# Patient Record
Sex: Male | Born: 1945 | Race: White | Hispanic: No | Marital: Married | Smoking: Former smoker
Health system: Southern US, Community
[De-identification: ages and names within clinical notes are randomized; demographics above are authoritative.]

## PROBLEM LIST (undated history)

## (undated) DIAGNOSIS — K219 Gastro-esophageal reflux disease without esophagitis: Secondary | ICD-10-CM

## (undated) DIAGNOSIS — Z87891 Personal history of nicotine dependence: Secondary | ICD-10-CM

## (undated) DIAGNOSIS — I519 Heart disease, unspecified: Secondary | ICD-10-CM

## (undated) DIAGNOSIS — E785 Hyperlipidemia, unspecified: Secondary | ICD-10-CM

## (undated) DIAGNOSIS — I10 Essential (primary) hypertension: Secondary | ICD-10-CM

## (undated) DIAGNOSIS — Z9289 Personal history of other medical treatment: Secondary | ICD-10-CM

## (undated) DIAGNOSIS — I2510 Atherosclerotic heart disease of native coronary artery without angina pectoris: Secondary | ICD-10-CM

## (undated) DIAGNOSIS — K5730 Diverticulosis of large intestine without perforation or abscess without bleeding: Secondary | ICD-10-CM

## (undated) DIAGNOSIS — K2970 Gastritis, unspecified, without bleeding: Secondary | ICD-10-CM

## (undated) DIAGNOSIS — M84376A Stress fracture, unspecified foot, initial encounter for fracture: Secondary | ICD-10-CM

## (undated) DIAGNOSIS — K222 Esophageal obstruction: Secondary | ICD-10-CM

## (undated) HISTORY — DX: Personal history of other medical treatment: Z92.89

## (undated) HISTORY — DX: Diverticulosis of large intestine without perforation or abscess without bleeding: K57.30

## (undated) HISTORY — DX: Atherosclerotic heart disease of native coronary artery without angina pectoris: I25.10

## (undated) HISTORY — PX: CATARACT EXTRACTION, BILATERAL: SHX1313

## (undated) HISTORY — DX: Gastro-esophageal reflux disease without esophagitis: K21.9

## (undated) HISTORY — DX: Gastritis, unspecified, without bleeding: K29.70

## (undated) HISTORY — DX: Personal history of nicotine dependence: Z87.891

## (undated) HISTORY — DX: Hyperlipidemia, unspecified: E78.5

## (undated) HISTORY — DX: Esophageal obstruction: K22.2

## (undated) HISTORY — DX: Heart disease, unspecified: I51.9

## (undated) HISTORY — DX: Essential (primary) hypertension: I10

## (undated) HISTORY — DX: Stress fracture, unspecified foot, initial encounter for fracture: M84.376A

---

## 1996-09-16 DIAGNOSIS — K219 Gastro-esophageal reflux disease without esophagitis: Secondary | ICD-10-CM

## 1996-09-16 HISTORY — DX: Gastro-esophageal reflux disease without esophagitis: K21.9

## 1999-03-17 DIAGNOSIS — E785 Hyperlipidemia, unspecified: Secondary | ICD-10-CM | POA: Insufficient documentation

## 1999-03-17 DIAGNOSIS — I10 Essential (primary) hypertension: Secondary | ICD-10-CM | POA: Insufficient documentation

## 1999-03-17 HISTORY — DX: Essential (primary) hypertension: I10

## 1999-03-17 HISTORY — DX: Hyperlipidemia, unspecified: E78.5

## 2000-04-16 ENCOUNTER — Encounter: Payer: Self-pay | Admitting: Family Medicine

## 2000-04-16 LAB — CONVERTED CEMR LAB: PSA: 0.6 ng/mL

## 2000-09-15 ENCOUNTER — Emergency Department (HOSPITAL_COMMUNITY): Admission: EM | Admit: 2000-09-15 | Discharge: 2000-09-15 | Payer: Self-pay | Admitting: Emergency Medicine

## 2000-09-16 ENCOUNTER — Encounter: Payer: Self-pay | Admitting: Emergency Medicine

## 2000-10-08 ENCOUNTER — Ambulatory Visit (HOSPITAL_COMMUNITY): Admission: RE | Admit: 2000-10-08 | Discharge: 2000-10-08 | Payer: Self-pay | Admitting: Internal Medicine

## 2000-10-08 ENCOUNTER — Encounter: Payer: Self-pay | Admitting: Internal Medicine

## 2000-10-08 HISTORY — PX: ESOPHAGOGASTRODUODENOSCOPY: SHX1529

## 2001-05-17 ENCOUNTER — Encounter: Payer: Self-pay | Admitting: Family Medicine

## 2001-05-17 LAB — CONVERTED CEMR LAB: PSA: 0.6 ng/mL

## 2002-06-16 ENCOUNTER — Encounter: Payer: Self-pay | Admitting: Family Medicine

## 2002-06-16 LAB — CONVERTED CEMR LAB: PSA: 0.4 ng/mL

## 2003-08-17 ENCOUNTER — Encounter: Payer: Self-pay | Admitting: Family Medicine

## 2003-08-17 LAB — CONVERTED CEMR LAB: PSA: 0.5 ng/mL

## 2004-07-17 DIAGNOSIS — I2510 Atherosclerotic heart disease of native coronary artery without angina pectoris: Secondary | ICD-10-CM | POA: Insufficient documentation

## 2004-07-17 HISTORY — DX: Atherosclerotic heart disease of native coronary artery without angina pectoris: I25.10

## 2004-07-18 ENCOUNTER — Ambulatory Visit: Payer: Self-pay | Admitting: Thoracic Surgery (Cardiothoracic Vascular Surgery)

## 2004-07-18 ENCOUNTER — Inpatient Hospital Stay (HOSPITAL_COMMUNITY): Admission: EM | Admit: 2004-07-18 | Discharge: 2004-07-24 | Payer: Self-pay

## 2004-07-18 ENCOUNTER — Ambulatory Visit: Payer: Self-pay | Admitting: Family Medicine

## 2004-07-21 DIAGNOSIS — Z951 Presence of aortocoronary bypass graft: Secondary | ICD-10-CM

## 2004-07-27 HISTORY — PX: CORONARY ARTERY BYPASS GRAFT: SHX141

## 2004-07-30 ENCOUNTER — Ambulatory Visit: Payer: Self-pay | Admitting: Cardiology

## 2004-08-03 ENCOUNTER — Ambulatory Visit: Payer: Self-pay | Admitting: Cardiology

## 2004-08-03 ENCOUNTER — Ambulatory Visit: Payer: Self-pay

## 2004-08-08 ENCOUNTER — Ambulatory Visit: Payer: Self-pay

## 2004-08-16 ENCOUNTER — Ambulatory Visit: Payer: Self-pay | Admitting: Cardiology

## 2004-08-29 ENCOUNTER — Ambulatory Visit: Payer: Self-pay | Admitting: Cardiology

## 2004-10-26 ENCOUNTER — Ambulatory Visit: Payer: Self-pay | Admitting: Cardiology

## 2004-11-16 ENCOUNTER — Ambulatory Visit: Payer: Self-pay | Admitting: Cardiology

## 2004-11-27 ENCOUNTER — Ambulatory Visit: Payer: Self-pay | Admitting: Cardiology

## 2005-01-03 ENCOUNTER — Ambulatory Visit: Payer: Self-pay | Admitting: Family Medicine

## 2005-02-05 ENCOUNTER — Ambulatory Visit: Payer: Self-pay | Admitting: Internal Medicine

## 2005-03-07 ENCOUNTER — Ambulatory Visit: Payer: Self-pay | Admitting: Family Medicine

## 2005-04-09 ENCOUNTER — Ambulatory Visit: Payer: Self-pay | Admitting: Family Medicine

## 2005-05-17 ENCOUNTER — Encounter: Payer: Self-pay | Admitting: Family Medicine

## 2005-05-17 LAB — CONVERTED CEMR LAB: PSA: 0.35 ng/mL

## 2005-06-12 ENCOUNTER — Ambulatory Visit: Payer: Self-pay | Admitting: Family Medicine

## 2005-06-20 ENCOUNTER — Ambulatory Visit: Payer: Self-pay | Admitting: Family Medicine

## 2005-07-10 ENCOUNTER — Ambulatory Visit: Payer: Self-pay | Admitting: Family Medicine

## 2005-09-13 ENCOUNTER — Ambulatory Visit: Payer: Self-pay | Admitting: Family Medicine

## 2005-09-20 ENCOUNTER — Ambulatory Visit: Payer: Self-pay | Admitting: Family Medicine

## 2005-09-26 ENCOUNTER — Ambulatory Visit: Payer: Self-pay | Admitting: Family Medicine

## 2005-11-25 ENCOUNTER — Ambulatory Visit: Payer: Self-pay | Admitting: Internal Medicine

## 2005-12-09 ENCOUNTER — Ambulatory Visit: Payer: Self-pay | Admitting: Cardiology

## 2005-12-10 ENCOUNTER — Ambulatory Visit: Payer: Self-pay | Admitting: Cardiology

## 2005-12-15 DIAGNOSIS — K5730 Diverticulosis of large intestine without perforation or abscess without bleeding: Secondary | ICD-10-CM

## 2005-12-15 HISTORY — DX: Diverticulosis of large intestine without perforation or abscess without bleeding: K57.30

## 2005-12-30 ENCOUNTER — Ambulatory Visit: Payer: Self-pay | Admitting: Internal Medicine

## 2005-12-30 HISTORY — PX: COLONOSCOPY: SHX174

## 2006-06-16 ENCOUNTER — Encounter: Payer: Self-pay | Admitting: Family Medicine

## 2006-06-16 LAB — CONVERTED CEMR LAB: PSA: 0.3 ng/mL

## 2006-06-24 ENCOUNTER — Ambulatory Visit: Payer: Self-pay | Admitting: Family Medicine

## 2006-07-04 ENCOUNTER — Ambulatory Visit: Payer: Self-pay | Admitting: Family Medicine

## 2006-07-08 ENCOUNTER — Ambulatory Visit: Payer: Self-pay | Admitting: Family Medicine

## 2006-10-23 ENCOUNTER — Ambulatory Visit: Payer: Self-pay | Admitting: Family Medicine

## 2006-11-17 ENCOUNTER — Ambulatory Visit (HOSPITAL_COMMUNITY): Admission: RE | Admit: 2006-11-17 | Discharge: 2006-11-17 | Payer: Self-pay | Admitting: Family Medicine

## 2006-11-17 ENCOUNTER — Encounter (INDEPENDENT_AMBULATORY_CARE_PROVIDER_SITE_OTHER): Payer: Self-pay | Admitting: *Deleted

## 2006-11-17 ENCOUNTER — Ambulatory Visit: Payer: Self-pay | Admitting: Family Medicine

## 2006-11-25 ENCOUNTER — Ambulatory Visit: Payer: Self-pay | Admitting: Internal Medicine

## 2006-11-26 ENCOUNTER — Ambulatory Visit: Payer: Self-pay | Admitting: Internal Medicine

## 2006-11-26 ENCOUNTER — Encounter (INDEPENDENT_AMBULATORY_CARE_PROVIDER_SITE_OTHER): Payer: Self-pay | Admitting: Specialist

## 2006-11-26 HISTORY — PX: COLONOSCOPY: SHX174

## 2006-11-27 ENCOUNTER — Ambulatory Visit: Payer: Self-pay | Admitting: Cardiology

## 2007-04-30 ENCOUNTER — Telehealth (INDEPENDENT_AMBULATORY_CARE_PROVIDER_SITE_OTHER): Payer: Self-pay | Admitting: *Deleted

## 2007-06-03 ENCOUNTER — Encounter: Payer: Self-pay | Admitting: Family Medicine

## 2007-06-04 DIAGNOSIS — H409 Unspecified glaucoma: Secondary | ICD-10-CM | POA: Insufficient documentation

## 2007-06-04 DIAGNOSIS — N529 Male erectile dysfunction, unspecified: Secondary | ICD-10-CM | POA: Insufficient documentation

## 2007-06-04 DIAGNOSIS — R7309 Other abnormal glucose: Secondary | ICD-10-CM | POA: Insufficient documentation

## 2007-06-04 DIAGNOSIS — E291 Testicular hypofunction: Secondary | ICD-10-CM

## 2007-07-01 ENCOUNTER — Ambulatory Visit: Payer: Self-pay | Admitting: Family Medicine

## 2007-07-01 LAB — CONVERTED CEMR LAB
BUN: 20 mg/dL (ref 6–23)
Basophils Absolute: 0 10*3/uL (ref 0.0–0.1)
Basophils Relative: 0.7 % (ref 0.0–1.0)
CO2: 31 meq/L (ref 19–32)
Calcium: 10.3 mg/dL (ref 8.4–10.5)
Chloride: 101 meq/L (ref 96–112)
Cholesterol: 139 mg/dL (ref 0–200)
Creatinine, Ser: 1 mg/dL (ref 0.4–1.5)
Eosinophils Absolute: 0.2 10*3/uL (ref 0.0–0.6)
Eosinophils Relative: 2.6 % (ref 0.0–5.0)
GFR calc Af Amer: 98 mL/min
GFR calc non Af Amer: 81 mL/min
Glucose, Bld: 105 mg/dL — ABNORMAL HIGH (ref 70–99)
HCT: 40.6 % (ref 39.0–52.0)
HDL: 49.1 mg/dL (ref >39.0)
Hemoglobin: 14.5 g/dL (ref 13.0–17.0)
LDL Cholesterol: 63 mg/dL (ref 0–99)
Lymphocytes Relative: 32.3 % (ref 12.0–46.0)
MCHC: 35.7 g/dL (ref 30.0–36.0)
MCV: 94.5 fL (ref 78.0–100.0)
Monocytes Absolute: 0.6 10*3/uL (ref 0.2–0.7)
Monocytes Relative: 9.4 % (ref 3.0–11.0)
Neutro Abs: 3.7 10*3/uL (ref 1.4–7.7)
Neutrophils Relative %: 55 % (ref 43.0–77.0)
PSA: 0.34 ng/mL (ref 0.10–4.00)
Platelets: 196 10*3/uL (ref 150–400)
Potassium: 4.7 meq/L (ref 3.5–5.1)
RBC: 4.29 M/uL (ref 4.22–5.81)
RDW: 12.9 % (ref 11.5–14.6)
Sodium: 138 meq/L (ref 135–145)
TSH: 1.23 microintl units/mL (ref 0.35–5.50)
Testosterone: 310.64 ng/dL — ABNORMAL LOW (ref 350.00–890)
Total CHOL/HDL Ratio: 2.8
Triglycerides: 134 mg/dL (ref 0–149)
VLDL: 27 mg/dL (ref 0–40)
WBC: 6.6 10*3/uL (ref 4.5–10.5)

## 2007-07-07 ENCOUNTER — Ambulatory Visit: Payer: Self-pay | Admitting: Family Medicine

## 2007-07-24 ENCOUNTER — Telehealth (INDEPENDENT_AMBULATORY_CARE_PROVIDER_SITE_OTHER): Payer: Self-pay | Admitting: *Deleted

## 2007-11-23 ENCOUNTER — Telehealth: Payer: Self-pay | Admitting: Family Medicine

## 2007-12-03 ENCOUNTER — Ambulatory Visit: Payer: Self-pay | Admitting: Cardiology

## 2008-04-26 ENCOUNTER — Telehealth: Payer: Self-pay | Admitting: Family Medicine

## 2008-07-04 ENCOUNTER — Ambulatory Visit: Payer: Self-pay | Admitting: Family Medicine

## 2008-07-04 LAB — CONVERTED CEMR LAB
ALT: 24 units/L (ref 0–53)
AST: 19 units/L (ref 0–37)
Albumin: 4.1 g/dL (ref 3.5–5.2)
Alkaline Phosphatase: 57 units/L (ref 39–117)
BUN: 14 mg/dL (ref 6–23)
Basophils Absolute: 0 10*3/uL (ref 0.0–0.1)
Basophils Relative: 0.7 % (ref 0.0–3.0)
Bilirubin, Direct: 0.1 mg/dL (ref 0.0–0.3)
CO2: 32 meq/L (ref 19–32)
Calcium: 9.6 mg/dL (ref 8.4–10.5)
Chloride: 99 meq/L (ref 96–112)
Cholesterol: 143 mg/dL (ref 0–200)
Creatinine, Ser: 1 mg/dL (ref 0.4–1.5)
Creatinine,U: 58.8 mg/dL
Eosinophils Absolute: 0.5 10*3/uL (ref 0.0–0.7)
Eosinophils Relative: 8.1 % — ABNORMAL HIGH (ref 0.0–5.0)
GFR calc Af Amer: 97 mL/min
GFR calc non Af Amer: 80 mL/min
Glucose, Bld: 113 mg/dL — ABNORMAL HIGH (ref 70–99)
HCT: 42.5 % (ref 39.0–52.0)
HDL: 53 mg/dL (ref >39.0)
Hemoglobin: 14.9 g/dL (ref 13.0–17.0)
LDL Cholesterol: 56 mg/dL (ref 0–99)
Lymphocytes Relative: 25.2 % (ref 12.0–46.0)
MCHC: 35.1 g/dL (ref 30.0–36.0)
MCV: 96.6 fL (ref 78.0–100.0)
Microalb Creat Ratio: 5.1 mg/g (ref 0.0–30.0)
Microalb, Ur: 0.3 mg/dL (ref 0.0–1.9)
Monocytes Absolute: 0.5 10*3/uL (ref 0.1–1.0)
Monocytes Relative: 8.8 % (ref 3.0–12.0)
Neutro Abs: 3.6 10*3/uL (ref 1.4–7.7)
Neutrophils Relative %: 57.2 % (ref 43.0–77.0)
PSA: 0.35 ng/mL (ref 0.10–4.00)
Platelets: 173 10*3/uL (ref 150–400)
Potassium: 4 meq/L (ref 3.5–5.1)
RBC: 4.4 M/uL (ref 4.22–5.81)
RDW: 12.6 % (ref 11.5–14.6)
Sodium: 139 meq/L (ref 135–145)
TSH: 1.69 microintl units/mL (ref 0.35–5.50)
Testosterone: 301.86 ng/dL — ABNORMAL LOW (ref 350.00–890)
Total Bilirubin: 0.9 mg/dL (ref 0.3–1.2)
Total CHOL/HDL Ratio: 2.7
Total Protein: 7.6 g/dL (ref 6.0–8.3)
Triglycerides: 168 mg/dL — ABNORMAL HIGH (ref 0–149)
VLDL: 34 mg/dL (ref 0–40)
WBC: 6.2 10*3/uL (ref 4.5–10.5)

## 2008-07-07 ENCOUNTER — Ambulatory Visit: Payer: Self-pay | Admitting: Family Medicine

## 2008-07-07 DIAGNOSIS — K5289 Other specified noninfective gastroenteritis and colitis: Secondary | ICD-10-CM

## 2008-07-22 ENCOUNTER — Ambulatory Visit: Payer: Self-pay | Admitting: Family Medicine

## 2008-07-22 LAB — CONVERTED CEMR LAB
OCCULT 1: NEGATIVE
OCCULT 2: NEGATIVE
OCCULT 3: NEGATIVE

## 2008-07-25 ENCOUNTER — Encounter (INDEPENDENT_AMBULATORY_CARE_PROVIDER_SITE_OTHER): Payer: Self-pay | Admitting: *Deleted

## 2008-08-05 ENCOUNTER — Telehealth: Payer: Self-pay | Admitting: Family Medicine

## 2008-11-21 ENCOUNTER — Encounter: Payer: Self-pay | Admitting: Cardiology

## 2008-11-21 ENCOUNTER — Ambulatory Visit: Payer: Self-pay | Admitting: Cardiology

## 2008-11-21 DIAGNOSIS — E663 Overweight: Secondary | ICD-10-CM | POA: Insufficient documentation

## 2009-04-04 ENCOUNTER — Ambulatory Visit: Payer: Self-pay | Admitting: Family Medicine

## 2009-04-04 LAB — CONVERTED CEMR LAB
Bilirubin Urine: NEGATIVE
Blood in Urine, dipstick: NEGATIVE
Glucose, Urine, Semiquant: NEGATIVE
Ketones, urine, test strip: NEGATIVE
Nitrite: NEGATIVE
Protein, U semiquant: NEGATIVE
Specific Gravity, Urine: 1.01
Urobilinogen, UA: 0.2
WBC Urine, dipstick: NEGATIVE
pH: 7

## 2009-07-10 ENCOUNTER — Ambulatory Visit: Payer: Self-pay | Admitting: Family Medicine

## 2009-07-10 LAB — CONVERTED CEMR LAB
ALT: 25 units/L (ref 0–53)
AST: 16 units/L (ref 0–37)
Albumin: 4 g/dL (ref 3.5–5.2)
Alkaline Phosphatase: 56 units/L (ref 39–117)
BUN: 17 mg/dL (ref 6–23)
Basophils Absolute: 0.1 10*3/uL (ref 0.0–0.1)
Basophils Relative: 1.2 % (ref 0.0–3.0)
Bilirubin, Direct: 0.1 mg/dL (ref 0.0–0.3)
CO2: 26 meq/L (ref 19–32)
Calcium: 9.2 mg/dL (ref 8.4–10.5)
Chloride: 102 meq/L (ref 96–112)
Cholesterol: 140 mg/dL (ref 0–200)
Creatinine, Ser: 1.1 mg/dL (ref 0.4–1.5)
Creatinine,U: 75 mg/dL
Direct LDL: 64.1 mg/dL
Eosinophils Absolute: 0.4 10*3/uL (ref 0.0–0.7)
Eosinophils Relative: 7.6 % — ABNORMAL HIGH (ref 0.0–5.0)
GFR calc non Af Amer: 71.84 mL/min (ref >60)
Glucose, Bld: 105 mg/dL — ABNORMAL HIGH (ref 70–99)
HCT: 39.8 % (ref 39.0–52.0)
HDL: 47.4 mg/dL (ref >39.00)
Hemoglobin: 14.2 g/dL (ref 13.0–17.0)
Lymphocytes Relative: 25.3 % (ref 12.0–46.0)
Lymphs Abs: 1.5 10*3/uL (ref 0.7–4.0)
MCHC: 35.6 g/dL (ref 30.0–36.0)
MCV: 99 fL (ref 78.0–100.0)
Microalb Creat Ratio: 2.7 mg/g (ref 0.0–30.0)
Microalb, Ur: 0.2 mg/dL (ref 0.0–1.9)
Monocytes Absolute: 0.5 10*3/uL (ref 0.1–1.0)
Monocytes Relative: 8.7 % (ref 3.0–12.0)
Neutro Abs: 3.4 10*3/uL (ref 1.4–7.7)
Neutrophils Relative %: 57.2 % (ref 43.0–77.0)
PSA: 0.45 ng/mL (ref 0.10–4.00)
Platelets: 187 10*3/uL (ref 150.0–400.0)
Potassium: 3.9 meq/L (ref 3.5–5.1)
RBC: 4.02 M/uL — ABNORMAL LOW (ref 4.22–5.81)
RDW: 12.3 % (ref 11.5–14.6)
Sodium: 137 meq/L (ref 135–145)
TSH: 2.02 microintl units/mL (ref 0.35–5.50)
Total Bilirubin: 0.9 mg/dL (ref 0.3–1.2)
Total CHOL/HDL Ratio: 3
Total Protein: 7.3 g/dL (ref 6.0–8.3)
Triglycerides: 213 mg/dL — ABNORMAL HIGH (ref 0.0–149.0)
VLDL: 42.6 mg/dL — ABNORMAL HIGH (ref 0.0–40.0)
WBC: 5.9 10*3/uL (ref 4.5–10.5)

## 2009-07-13 ENCOUNTER — Ambulatory Visit: Payer: Self-pay | Admitting: Family Medicine

## 2009-07-13 DIAGNOSIS — R490 Dysphonia: Secondary | ICD-10-CM

## 2009-07-21 ENCOUNTER — Encounter: Admission: RE | Admit: 2009-07-21 | Discharge: 2009-07-21 | Payer: Self-pay | Admitting: Otolaryngology

## 2009-07-21 ENCOUNTER — Encounter (INDEPENDENT_AMBULATORY_CARE_PROVIDER_SITE_OTHER): Payer: Self-pay | Admitting: *Deleted

## 2009-08-09 ENCOUNTER — Ambulatory Visit: Payer: Self-pay | Admitting: Family Medicine

## 2009-08-09 LAB — CONVERTED CEMR LAB
OCCULT 1: NEGATIVE
OCCULT 2: NEGATIVE
OCCULT 3: NEGATIVE

## 2009-08-09 LAB — FECAL OCCULT BLOOD, GUAIAC: Fecal Occult Blood: NEGATIVE

## 2009-09-22 ENCOUNTER — Encounter (INDEPENDENT_AMBULATORY_CARE_PROVIDER_SITE_OTHER): Payer: Self-pay | Admitting: *Deleted

## 2009-11-22 ENCOUNTER — Encounter: Payer: Self-pay | Admitting: Cardiology

## 2009-11-22 ENCOUNTER — Ambulatory Visit: Payer: Self-pay

## 2009-12-13 ENCOUNTER — Ambulatory Visit: Payer: Self-pay

## 2009-12-13 ENCOUNTER — Ambulatory Visit: Payer: Self-pay | Admitting: Cardiology

## 2010-04-18 ENCOUNTER — Encounter (INDEPENDENT_AMBULATORY_CARE_PROVIDER_SITE_OTHER): Payer: Self-pay | Admitting: *Deleted

## 2010-06-24 ENCOUNTER — Encounter (INDEPENDENT_AMBULATORY_CARE_PROVIDER_SITE_OTHER): Payer: Self-pay | Admitting: *Deleted

## 2010-07-06 ENCOUNTER — Telehealth (INDEPENDENT_AMBULATORY_CARE_PROVIDER_SITE_OTHER): Payer: Self-pay | Admitting: *Deleted

## 2010-07-11 ENCOUNTER — Ambulatory Visit: Payer: Self-pay | Admitting: Family Medicine

## 2010-07-11 LAB — CONVERTED CEMR LAB
Albumin: 4 g/dL (ref 3.5–5.2)
CO2: 28 meq/L (ref 19–32)
Calcium: 9.6 mg/dL (ref 8.4–10.5)
Cholesterol: 138 mg/dL (ref 0–200)
Creatinine, Ser: 1.2 mg/dL (ref 0.4–1.5)
GFR calc non Af Amer: 68.03 mL/min (ref >60)
Glucose, Bld: 97 mg/dL (ref 70–99)
PSA: 0.37 ng/mL (ref 0.10–4.00)
Total CHOL/HDL Ratio: 3
Total Protein: 7 g/dL (ref 6.0–8.3)
Triglycerides: 181 mg/dL — ABNORMAL HIGH (ref 0.0–149.0)

## 2010-07-17 ENCOUNTER — Ambulatory Visit: Payer: Self-pay | Admitting: Family Medicine

## 2010-07-17 ENCOUNTER — Encounter (INDEPENDENT_AMBULATORY_CARE_PROVIDER_SITE_OTHER): Payer: Self-pay | Admitting: *Deleted

## 2010-07-17 DIAGNOSIS — R1319 Other dysphagia: Secondary | ICD-10-CM

## 2010-09-07 DIAGNOSIS — K222 Esophageal obstruction: Secondary | ICD-10-CM

## 2010-09-18 ENCOUNTER — Encounter: Payer: Self-pay | Admitting: Internal Medicine

## 2010-09-18 ENCOUNTER — Ambulatory Visit
Admission: RE | Admit: 2010-09-18 | Discharge: 2010-09-18 | Payer: Self-pay | Source: Home / Self Care | Attending: Internal Medicine | Admitting: Internal Medicine

## 2010-09-25 ENCOUNTER — Encounter: Payer: Self-pay | Admitting: Internal Medicine

## 2010-09-25 ENCOUNTER — Ambulatory Visit
Admission: RE | Admit: 2010-09-25 | Discharge: 2010-09-25 | Payer: Self-pay | Source: Home / Self Care | Attending: Internal Medicine | Admitting: Internal Medicine

## 2010-10-01 ENCOUNTER — Encounter: Payer: Self-pay | Admitting: Internal Medicine

## 2010-10-08 ENCOUNTER — Ambulatory Visit
Admission: RE | Admit: 2010-10-08 | Discharge: 2010-10-08 | Payer: Self-pay | Source: Home / Self Care | Attending: Internal Medicine | Admitting: Internal Medicine

## 2010-10-08 DIAGNOSIS — K2970 Gastritis, unspecified, without bleeding: Secondary | ICD-10-CM | POA: Insufficient documentation

## 2010-10-08 DIAGNOSIS — K2990 Gastroduodenitis, unspecified, without bleeding: Secondary | ICD-10-CM

## 2010-10-16 NOTE — Assessment & Plan Note (Signed)
Summary: CPX/CLE   Vital Signs:  Patient profile:   65 year old male Height:      67 inches Weight:      187.75 pounds BMI:     29.51 Temp:     97.8 degrees F oral Pulse rate:   76 / minute Pulse rhythm:   regular BP sitting:   122 / 66  (left arm) Cuff size:   regular  Vitals Entered By: Delilah Shan CMA Duncan Dull) (July 17, 2010 11:17 AM) CC: CPX, Preventive Care   History of Present Illness: CPE- See prev med.   H/o EGD for stricture, was dilated.  Did well until 2 weeks ago.  Was in [place] and ate a late dinner.  Was eating in a hurry.  Was eating steak and "it got stuck."  Seen at ER and food was removed. Needs GI referral for further eval.   CAD and stress test done 3/11.  No CP/SOB/BLE edema.  Feeling well, no NTG use.   Hypertension:      Using medication without problems or lightheadedness: yes Chest pain with exertion:no Edema:no Short of breath:no Average home BPs:occ, 120-132/80-82 Other issues: no  Elevated Cholesterol: Using medications without problems:yes Muscle aches: no Other complaints: no  ED- good relief with viagra.  Not using NTG.  Needs refill.   Allergies: No Known Drug Allergies  Past History:  Past Medical History: Last updated: 11/20/2008 Hyperlipidemia (03/17/1999) Hypertension (03/17/1999) GERD (09/16/1996) Coronary artery disease (07/17/2004) Diverticulosis, colon (12/15/2005) Glaucoma Previous Tobacco Abuse (quit 2000)  Past Surgical History: EDG acute food impaction- removed 09/16/00 EDG stricture distal esoph 10/08/00 Coronary artery disease (status post CABG Jul 27, 2004 by  Dr. Cornelius Moras with a LIMA to the LAD, RIMA the distal right coronary artery,   saphenous vein graft first diagonal, saphenous vein graft to first   circumflex marginal, sequential saphenous vein graft to the second  Colonoscopy divertics, mild 12/30/05 10 yrs Colonoscopy colitis bx neg 11/26/06  Family History: Reviewed history from 07/13/2009 and no  changes required. father died 46 stroke x 2  mother died 29 diff eating/dysphagia pvd brother A  htn sister A   sister A   sister dec 63 breast ca Htn cv + bro mi HBP + bro, sist dm neg breast ca sist died colon ca + polyps /o6 depression neg etoh/ drug abuse neg + stroke father   Social History: Reviewed history from 11/20/2008 and no changes required. 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  Otherwise negative.    Physical Exam  General:  GEN: nad, alert and oriented HEENT: mucous membranes moist NECK: supple w/o LA CV: rrr.  no murmur PULM: ctab, no inc wob ABD: soft, +bs EXT: no edema SKIN: no acute rash, midline sternotomy scar healed.  Rectal:  No external abnormalities noted. Normal sphincter tone. No rectal masses or tenderness. Prostate:  Prostate gland firm and smooth, minimal  enlargement, but no nodularity, tenderness, mass, asymmetry or induration.   Impression & Recommendations:  Problem # 1:  HEALTH MAINTENANCE EXAM (ICD-V70.0) Flu done at work, shingles dw patient.  Up to date on colonoscopy.  D/w patient ZO:XWRU, execise, alcohol in moderation.   Problem # 2:  SPECIAL SCREENING MALIG NEOPLASMS OTHER SITES (ICD-V76.49) PSA okay  and exam wnl.    Problem # 3:  ERECTILE DYSFUNCTION, ORGANIC (ICD-607.84) cont current meds.  His updated medication list for this problem includes:    Viagra 100 Mg Tabs (Sildenafil citrate) ..... One tab by mouth 1 hr prior  Problem # 4:  HYPERTENSION (ICD-401.9) no change in meds.  His updated medication list for this problem includes:    Spironolactone 25 Mg Tabs (Spironolactone) .Marland Kitchen... 1 by mouth daily    Metoprolol Tartrate 100 Mg Tabs (Metoprolol tartrate) .Marland Kitchen... 1 by mouth two times a day    Diovan Hct 160-25 Mg  Tabs (Valsartan-hydrochlorothiazide) .Marland Kitchen... 1 by mouth daily    Norvasc 10 Mg Tabs (Amlodipine besylate) .Marland Kitchen... 1 by mouth daily  Problem # 5:  HYPERLIPIDEMIA (ICD-272.4) d/w patient re: TG and alcohol.  no change in meds.   His updated medication list for this problem includes:    Lipitor 80 Mg Tabs (Atorvastatin calcium) .Marland Kitchen... 1/2 by mouth daily  Problem # 6:  OTHER DYSPHAGIA (NFA-213.08) refer.  Orders: Gastroenterology Referral (GI)  Complete Medication List: 1)  Spironolactone 25 Mg Tabs (Spironolactone) .Marland Kitchen.. 1 by mouth daily 2)  Metoprolol Tartrate 100 Mg Tabs (Metoprolol tartrate) .Marland Kitchen.. 1 by mouth two times a day 3)  Lipitor 80 Mg Tabs (Atorvastatin calcium) .... 1/2 by mouth daily 4)  Diovan Hct 160-25 Mg Tabs (Valsartan-hydrochlorothiazide) .Marland Kitchen.. 1 by mouth daily 5)  Norvasc 10 Mg Tabs (Amlodipine besylate) .Marland Kitchen.. 1 by mouth daily 6)  Adult Aspirin Ec Low Strength 81 Mg Tbec (Aspirin) .... Take 1 tablet by mouth once a day 7)  Timolol Maleate 0.5 % Soln (Timolol maleate) .... As directed 8)  Bl Multiple Vitamins Tabs (Multiple vitamins-minerals) .... Take 1 tablet by mouth once a day 9)  Viagra 100 Mg Tabs (Sildenafil citrate) .... One tab by mouth 1 hr prior  Colorectal Screening:  Current Recommendations:    Hemoccult: NEG X 1 today  PSA Screening:    PSA: 0.37  (07/11/2010)  Immunization & Chemoprophylaxis:    Tetanus vaccine: Tdap  (07/13/2009)    Influenza vaccine: Historical  (06/15/2010)  Patient Instructions: 1)  Don't change your meds.   2)  Check with your insurance to see if they will cover the shingles shot.  You can get that here at a nurse visit.   3)  Take care.  I would recheck your labs at a physical next year.  4)  See Shirlee Limerick about your referral before your leave today.   Prescriptions: VIAGRA 100 MG  TABS (SILDENAFIL CITRATE) one tab by mouth 1 hr prior  #8 x 12   Entered and Authorized by:   Crawford Givens MD   Signed by:   Crawford Givens MD on  07/17/2010   Method used:   Electronically to        [facility]  [address] [phone]* (retail)       [address]       [place], [place]  [address]       Ph: [phone]       Fax: [phone]   RxID:   [id number]    Orders Added: 1)  Est. Patient 40-64 years [99396] 2)  Est. Patient Level IV [64332] 3)  Gastroenterology Referral [GI]   Immunization History:  Influenza Immunization History:    Influenza:  historical (06/15/2010)   Immunization History:  Influenza Immunization History:    Influenza:  Historical (06/15/2010)  Current Allergies (reviewed today):  No known allergies     Prevention & Chronic Care Immunizations   Influenza vaccine: Historical  (06/15/2010)    Tetanus booster: 07/13/2009: Tdap    Pneumococcal vaccine: Not documented    H. zoster vaccine: Not documented  Colorectal Screening   Hemoccult: negative  (08/09/2009)   Hemoccult action/deferral: NEG X 1 today  (07/17/2010)    Colonoscopy: Not documented  Other Screening   PSA: 0.37  (07/11/2010)   Smoking status: quit  (07/13/2009)  Lipids   Total Cholesterol: 138  (07/11/2010)   LDL: 49  (07/11/2010)   LDL Direct: 64.1  (07/10/2009)   HDL: 52.70  (07/11/2010)   Triglycerides: 181.0  (07/11/2010)    SGOT (AST): 13  (07/11/2010)   SGPT (ALT): 20  (07/11/2010)   Alkaline phosphatase: 55  (07/11/2010)   Total bilirubin: 0.6  (07/11/2010)    Lipid flowsheet reviewed?: Yes   Progress toward LDL goal: At goal  Hypertension   Last Blood Pressure: 122 / 66  (07/17/2010)   Serum creatinine: 1.2  (07/11/2010)   Serum potassium 4.4  (07/11/2010)    Hypertension flowsheet reviewed?: Yes   Progress toward BP goal: At goal  Self-Management Support :    Hypertension self-management support: Not documented    Lipid self-management support: Not documented

## 2010-10-16 NOTE — Letter (Signed)
Summary: Alan [name] letter  Alan [name] at [facility]  [address] [place], [place] [address]   Phone: [phone]  Fax: [phone]       04/18/2010 MRN: [id number]  Alan [name] [address] [place], [place]  [address]  Dear Mr. Lajean Saver [facility], and [facility] announce the retirement of Arta Silence, M.D., from full-time practice at the [facility] office effective March 15, 2010 and his plans of returning part-time.  It is important to Dr. Hetty Ely and to our practice that you understand that [facility] has seven physicians in our office for your health care needs.  We will continue to offer the same exceptional care that you have today.    Dr. Hetty Ely has spoken to many of you about his plans for retirement and returning part-time in the fall.   We will continue to work with you through the transition to schedule appointments for you in the office and meet the high standards that Maribel is committed to.   Again, it is with great pleasure that we share the news that Dr. Hetty Ely will return to [facility] in October of 2011 with a reduced schedule.    If you have any questions, or would like to request an appointment with one of our physicians, please call us at [phone] and press the option for Scheduling an appointment.  We take pleasure in providing you with excellent patient care and look forward to seeing you at your next office visit.  Our [facility] Physicians are:  Tillman Abide, M.D. Laurita Quint, M.D. Roxy Manns, M.D. Kerby Nora, M.D. Hannah Beat, M.D. Ruthe Mannan, M.D. We proudly welcomed Raechel Ache, M.D. and Eustaquio Boyden, M.D. to the practice in July/August 2011.  Sincerely,  [facility]

## 2010-10-16 NOTE — Assessment & Plan Note (Signed)
Summary: 1 yr rov 414.01   pfh      Allergies Added: NKDA  Visit Type:  Follow-up Primary Provider:  Shaune Leeks MD  CC:  CAD/CABG.  History of Present Illness: The patient presents for yearly followup. He had bypass in 2005. He has done quite well over the past year. He anticipates in a row but exercise 3 days per week. With this he denies any chest pressure, neck or arm discomfort. He is not having any palpitations, presyncope or syncope. He denies any shortness of breath, PND or orthopnea. He does get some atypical sharp discomfort in his left shoulder with certain movements but this is unlike any previous cardiovascular symptoms. He had none of the profound fatigue that he had at the time of his bypass.  Current Medications (verified): 1)  Spironolactone 25 Mg Tabs (Spironolactone) .Marland Kitchen.. 1 By Mouth Daily 2)  Metoprolol Tartrate 100 Mg Tabs (Metoprolol Tartrate) .Marland Kitchen.. 1 By Mouth Two Times A Day 3)  Lipitor 80 Mg Tabs (Atorvastatin Calcium) .... 1/2 By Mouth Daily 4)  Diovan Hct 160-25 Mg  Tabs (Valsartan-Hydrochlorothiazide) .Marland Kitchen.. 1 By Mouth Daily 5)  Norvasc 10 Mg  Tabs (Amlodipine Besylate) .Marland Kitchen.. 1 By Mouth Daily 6)  Adult Aspirin Ec Low Strength 81 Mg  Tbec (Aspirin) .... Take 1 Tablet By Mouth Once A Day 7)  Timolol Maleate 0.5 %  Soln (Timolol Maleate) .... As Directed 8)  Bl Multiple Vitamins   Tabs (Multiple Vitamins-Minerals) .... Take 1 Tablet By Mouth Once A Day 9)  Viagra 100 Mg  Tabs (Sildenafil Citrate) .... One Tab By Mouth 1 Hr Prior  Allergies (verified): No Known Drug Allergies  Past History:  Past Medical History: Reviewed history from 11/20/2008 and no changes required. Hyperlipidemia (03/17/1999) Hypertension (03/17/1999) GERD (09/16/1996) Coronary artery disease (07/17/2004) Diverticulosis, colon (12/15/2005) Glaucoma Previous Tobacco Abuse (quit 2000)  Past Surgical History: Reviewed history from 11/20/2008 and no changes required. EDG acute  food impaction- removed /09/16/00 EDG stricture distal esoph 10/08/00 Coronary artery disease (status post CABG Jul 27, 2004 by  Dr. Cornelius Moras with a LIMA to the LAD, RIMA the distal right coronary artery,   saphenous vein graft first diagonal, saphenous vein graft to first   circumflex marginal, sequential saphenous vein graft to the second  Colonoscopy divertics, mild 12/30/05 10 yrs Colonoscopy colitis bx neg 11/26/06  Review of Systems       As stated in the HPI and negative for all other systems.   Vital Signs:  Patient profile:   65 year old male Height:      67 inches Weight:      186 pounds BMI:     29.24 Pulse rate:   65 / minute Resp:     16 per minute BP sitting:   142 / 78  (right arm)  Vitals Entered By: Marrion Coy, CNA (November 22, 2009 9:02 AM)  Physical Exam  General:  Well developed, well nourished, in no acute distress. Head:  Normocephalic and atraumatic without obvious abnormalities. No apparent alopecia or balding. Eyes:  PERRLA/EOM intact; conjunctiva and lids normal. Mouth:  Teeth, gums and palate normal. Oral mucosa normal. Neck:  Neck supple, no JVD. No masses, thyromegaly or abnormal cervical nodes. Chest Wall:  well healed sternotomy scar Lungs:  Clear bilaterally to auscultation and percussion. Abdomen:  Bowel sounds positive; abdomen soft and non-tender without masses, organomegaly, or hernias noted. No hepatosplenomegaly. Msk:  Back normal, normal gait. Muscle strength and tone normal. Extremities:  No clubbing or cyanosis. Neurologic:  Alert and oriented x 3. Skin:  Intact without lesions or rashes. Cervical Nodes:  no significant adenopathy Axillary Nodes:  no significant adenopathy Inguinal Nodes:  no significant adenopathy Psych:  Normal affect.   Detailed Cardiovascular Exam  Neck    Carotids: Carotids full and equal bilaterally without bruits.      Neck Veins: Normal, no JVD.    Heart    Inspection: no deformities or lifts noted.       Palpation: normal PMI with no thrills palpable.      Auscultation: regular rate and rhythm, S1, S2 without murmurs, rubs, gallops, or clicks.    Vascular    Abdominal Aorta: no palpable masses, pulsations, or audible bruits.      Femoral Pulses: normal femoral pulses bilaterally.      Pedal Pulses: normal pedal pulses bilaterally.      Radial Pulses: normal radial pulses bilaterally.      Peripheral Circulation: no clubbing, cyanosis, or edema noted with normal capillary refill.     EKG  Procedure date:  11/22/2009  Findings:      sinus rhythm, rate 65, axis within normal limits, intervals within normal limits, no acute ST-T wave changes.  Impression & Recommendations:  Problem # 1:  CORONARY ARTERY BYPASS GRAFT, HX OF (ICD-V45.81) The patient is doing well. I again emphasized secondary risk reduction. Because it has now been 6 years since his bypass I will have him do a plain old exercise treadmill(POET). Orders: EKG w/ Interpretation (93000)  Problem # 2:  HYPERTENSION (ICD-401.9) His blood pressure is borderline. We discussed target. I will observe his blood pressure at the time of the treadmill. He says it is better controlled when he takes it otherwise. For now he will continue on the meds as listed.  Problem # 3:  HYPERLIPIDEMIA (ICD-272.4) I reviewed his lipid profile from October. His HDL was 47 and LDL 64. This is an excellent ratio. His triglycerides were slightly high at at 213. However, I would not change his therapy at this point but would recommend continued dietary restriction.  Other Orders: Treadmill (Treadmill)  Patient Instructions: 1)  Your physician recommends that you schedule a follow-up appointment at the time of your stress test 2)  Your physician recommends that you continue on your current medications as directed. Please refer to the Current Medication list given to you today. 3)  Your physician has requested that you have an exercise tolerance test.   For further information please visit [email or web address]  Please also follow instruction sheet, as given.

## 2010-10-16 NOTE — Letter (Signed)
Summary: Appointment - Reminder 2  [facility], Main Office  [address]   [place], [place] [address]   Phone: [phone]  Fax: [phone]     September 22, 2009 MRN: [id number]   Alan [name] [address] [place], [place]  [address]   Dear [name],  Our records indicate that it is time to schedule a follow-up appointment.Dr.Hochrein recommended that you follow up with [place] in March,2011. It is very important that we reach you to schedule this appointment. We look forward to participating in your health care needs. Please contact us at the number listed above at your earliest convenience to schedule your appointment.  If you are unable to make an appointment at this time, give [place] a call so we can update our records.     Sincerely,   Glass blower/designer

## 2010-10-16 NOTE — Progress Notes (Signed)
----   Converted from flag ---- ---- 07/05/2010 1:40 PM, Crawford Givens MD wrote: cmet/lipid 401.1 psa v76.44  ---- 07/05/2010 9:56 AM, Liane Comber CMA (AAMA) wrote: Lab orders please! Good Morning! This pt is scheduled for cpx labs Wed, which labs to draw and dx codes to use? Thanks Tasha ------------------------------

## 2010-10-16 NOTE — Letter (Signed)
Summary: New Patient letter  [facility]  [address] [place], [place] [address]   Phone: [phone]  Fax: [phone]       07/17/2010 MRN: [id number]  Alan [name] [address] [place], [place]  [address]  Dear [name],  Welcome to the Gastroenterology Division at [facility].    You are scheduled to see Dr.  Juanda Chance on 09-18-09 at 10:30a.m. on the 3rd floor at [facility], [address]  We ask that you try to arrive at our office 15 minutes prior to your appointment time to allow for check-in.  We would like you to complete the enclosed self-administered evaluation form prior to your visit and bring it with you on the day of your appointment.  We will review it with you.  Also, please bring a complete list of all your medications or, if you prefer, bring the medication bottles and we will list them.  Please bring your insurance card so that we may make a copy of it.  If your insurance requires a referral to see a specialist, please bring your referral form from your primary care physician.  Co-payments are due at the time of your visit and may be paid by cash, check or credit card.     Your office visit will consist of a consult with your physician (includes a physical exam), any laboratory testing he/she may order, scheduling of any necessary diagnostic testing (e.g. x-ray, ultrasound, CT-scan), and scheduling of a procedure (e.g. Endoscopy, Colonoscopy) if required.  Please allow enough time on your schedule to allow for any/all of these possibilities.    If you cannot keep your appointment, please call [phone] to cancel or reschedule prior to your appointment date.  This allows [place] the opportunity to schedule an appointment for another patient in need of care.  If you do not cancel or reschedule by 5 p.m. the business day prior to your appointment date, you will be charged a $50.00 late cancellation/no-show fee.    Thank you for  choosing [facility] for your medical needs.  We appreciate the opportunity to care for you.  Please visit [place] at our website  to learn more about our practice.                     Sincerely,                                                             The Gastroenterology Division

## 2010-10-18 NOTE — Letter (Signed)
Summary: EGD Instructions  [facility]  [address] [place], [place] [address]   Phone: [phone]  Fax: [phone]       Alan [name]    [birth year]    MRN: [id number]       Procedure Day /Date: Tuesday 09/25/10     Arrival Time: 8:00 am     Procedure Time: 9:00 am     Location of Procedure:                    _x  _ [facility] (4th Floor)  PREPARATION FOR ENDOSCOPY   On 09/25/10 THE DAY OF THE PROCEDURE:  1.   No solid foods, milk or milk products are allowed after midnight the night before your procedure.  2.   Do not drink anything colored red or purple.  Avoid juices with pulp.  No orange juice.  3.  You may drink clear liquids until 7:00 am, which is 2 hours before your procedure.                                                                                                CLEAR LIQUIDS INCLUDE: Water Jello Ice Popsicles Tea (sugar ok, no milk/cream) Powdered fruit flavored drinks Coffee (sugar ok, no milk/cream) Gatorade Juice: apple, white grape, white cranberry  Lemonade Clear bullion, consomm, broth Carbonated beverages (any kind) Strained chicken noodle soup Hard Candy   MEDICATION INSTRUCTIONS  Unless otherwise instructed, you should take regular prescription medications with a small sip of water as early as possible the morning of your procedure.                    OTHER INSTRUCTIONS  You will need a responsible adult at least 65 years of age to accompany you and drive you home.   This person must remain in the waiting room during your procedure.  Wear loose fitting clothing that is easily removed.  Leave jewelry and other valuables at home.  However, you may wish to bring a book to read or an iPod/MP3 player to listen to music as you wait for your procedure to start.  Remove all body piercing jewelry and leave at home.  Total time from sign-in until discharge is approximately 2-3 hours.  You should  go home directly after your procedure and rest.  You can resume normal activities the day after your procedure.  The day of your procedure you should not:   Drive   Make legal decisions   Operate machinery   Drink alcohol   Return to work  You will receive specific instructions about eating, activities and medications before you leave.    The above instructions have been reviewed and explained to me by  Lamona Curl CMA Duncan Dull)  September 18, 2010 11:27 AM     I fully understand and can verbalize these instructions _____________________________ Date 09/18/10

## 2010-10-18 NOTE — Assessment & Plan Note (Signed)
Summary: dysphagia--ch.    History of Present Illness Visit Type: Initial Consult Primary GI MD: Lina Sar MD Primary Provider: Shaune Leeks MD Requesting Provider: Crawford Givens, MD Chief Complaint: dysphagia, no problems recently, previous episode of meat getting struck that required hospital visit in OCT. History of Present Illness:   This is a 65 year old, white male with a recent food impaction while visiting his daughter in [place]. The piece of meat was removed endoscopically in the emergency room. He has a history of a benign esophageal stricture and is status post esophageal dilatation by me in 2002 after an episode of food impaction. He was dilated from 14-17 mm. Additional medical problems include coronary artery disease for which he is status post bypass in 2007. He also has high blood pressure and glaucoma. He has a family history of colon polyps and a personal history of cecal ulcers on a colonoscopy March 2008 which was most likely ischemic according to the biopsies.  He has no lower GI symptoms. A recent ENT evaluation for hoarseness showed no significant disease. He was put on Prevacid 15 mg once a day for 4 weeks then p.r.n. He denies heartburn. He denies any dysphagia to liquids and he denies odynophagia. He is on aspirin 81 mg daily.   GI Review of Systems    Reports acid reflux, bloating, and  dysphagia with solids.      Denies abdominal pain, belching, chest pain, dysphagia with liquids, heartburn, loss of appetite, nausea, vomiting, vomiting blood, weight loss, and  weight gain.        Denies anal fissure, black tarry stools, change in bowel habit, constipation, diarrhea, diverticulosis, fecal incontinence, heme positive stool, hemorrhoids, irritable bowel syndrome, jaundice, light color stool, liver problems, rectal bleeding, and  rectal pain.    Current Medications (verified): 1)  Spironolactone 25 Mg Tabs (Spironolactone) .Marland Kitchen.. 1 By Mouth Daily 2)   Metoprolol Tartrate 100 Mg Tabs (Metoprolol Tartrate) .Marland Kitchen.. 1 By Mouth Two Times A Day 3)  Lipitor 80 Mg Tabs (Atorvastatin Calcium) .... 1/2 By Mouth Daily 4)  Diovan Hct 160-25 Mg  Tabs (Valsartan-Hydrochlorothiazide) .Marland Kitchen.. 1 By Mouth Daily 5)  Norvasc 10 Mg  Tabs (Amlodipine Besylate) .Marland Kitchen.. 1 By Mouth Daily 6)  Adult Aspirin Ec Low Strength 81 Mg  Tbec (Aspirin) .... Take 1 Tablet By Mouth Once A Day 7)  Timolol Maleate 0.5 %  Soln (Timolol Maleate) .... As Directed 8)  Bl Multiple Vitamins   Tabs (Multiple Vitamins-Minerals) .... Take 1 Tablet By Mouth Once A Day 9)  Viagra 100 Mg  Tabs (Sildenafil Citrate) .... One Tab By Mouth 1 Hr Prior  Allergies (verified): No Known Drug Allergies  Past History:  Past Medical History: Reviewed history from 11/20/2008 and no changes required. Hyperlipidemia (03/17/1999) Hypertension (03/17/1999) GERD (09/16/1996) Coronary artery disease (07/17/2004) Diverticulosis, colon (12/15/2005) Glaucoma Previous Tobacco Abuse (quit 2000)  Past Surgical History: EDG acute food impaction- removed 09/16/00 EDG stricture distal esoph 10/08/00 Coronary artery disease (status post CABG Jul 27, 2004 by  Dr. Cornelius Moras with a LIMA to the LAD, RIMA the distal right coronary artery,   saphenous vein graft first diagonal, saphenous vein graft to first   circumflex marginal, sequential saphenous vein graft to the second  Colonoscopy divertics, mild 12/30/05 10 yrs Colonoscopy colitis bx neg 11/26/06 CABG  Family History: Reviewed history from 09/07/2010 and no changes required. father died 25 stroke x 2  mother died 77 diff eating/dysphagia pvd brother A  htn sister A  sister A   sister dec 63 breast ca Htn cv + bro mi HBP + bro, sist dm neg  breast ca sist died colon ca + polyps /o6 depression neg etoh/ drug abuse neg + stroke father  Family History of Colon Polyps:Brother  Social History: Reviewed history from 07/17/2010 and no changes required. 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  The patient denies allergy/sinus, anemia, anxiety-new, arthritis/joint pain, back pain, blood in urine, breast changes/lumps, change in vision, confusion, cough, coughing up blood, depression-new, fainting, fatigue, fever, headaches-new, hearing problems, heart murmur, heart rhythm changes, itching, menstrual pain, muscle pains/cramps, night sweats, nosebleeds, pregnancy symptoms, shortness of breath, skin rash, sleeping problems, sore throat, swelling of feet/legs, swollen lymph glands, thirst - excessive , urination - excessive , urination changes/pain, urine leakage, and vision changes.         Pertinent positive and negative review of systems were noted in the above HPI. All other ROS was otherwise negative.   Vital Signs:  Patient profile:   65 year old male Height:      67 inches Weight:      187.38 pounds BMI:     29.45 Pulse rate:   80 / minute Pulse rhythm:   regular BP sitting:   126 / 72  (left arm) Cuff size:   regular  Vitals Entered By: June McMurray CMA Duncan Dull) (September 18, 2010 10:18 AM)  Physical Exam  General:  Well developed, well nourished, he has a raspy voice Mouth:  No deformity or lesions, dentition normal. Lungs:  Clear throughout to auscultation.   Impression & Recommendations:  Problem # 1:  Hx of ESOPHAGEAL STRICTURE (ICD-530.3) Patient is status post food impaction 3 months ago. He will need an upper endoscopy and repeat esophageal dilation. The first episode occurred 10 years ago and he did well for 10 years without taking any acid suppressing agents. We have discussed the etiology of the esophageal stricture in that it is related to acid reflux and I suggested that he continues on his  Prevacid 50 mg daily. We will go ahead and schedule him for an endoscopy.  Orders: EGD SAV (EGD SAV)  Problem # 2:  SPECIAL SCREENING MALIG NEOPLASMS OTHER SITES (ICD-V76.49) Patient has a family history of colon polyps. He had an essentially normal colonoscopy in 2008 except for cecal ulcerations, most likely ischemic in nature.  Patient Instructions: 1)  You have been scheduled for an endoscopy with dilation. Please follow written prep instructions that were given to you today at your visit.  2)  Continue to take Prevacid 50 mg on a daily. 3)  Follow antireflux measures. 4)  Recall colonoscopy 2018. 5)  Copy sent to : Dr Rene Kocher, Dr Reece Agar.Duncan 6)  The medication list was reviewed and reconciled.  All changed / newly prescribed medications were explained.  A complete medication list was provided to the patient / caregiver.

## 2010-10-18 NOTE — Procedures (Signed)
Summary: COLON   Colonoscopy  Procedure date:  11/26/2006  Findings:      Location:  [facility].   Patient Name: Alan [name], Alan [name] MRN: [id number] Procedure Procedures: Colonoscopy CPT: 54098.    with biopsy. CPT: Q5068410.  Personnel: Endoscopist: Dora L. Juanda Chance, MD.  Exam Location: Exam performed in Outpatient Clinic. Outpatient  Patient Consent: Procedure, Alternatives, Risks and Benefits discussed, consent obtained, from patient. Consent was obtained by the RN.  Indications Symptoms: Abdominal pain / bloating. Change in bowel habits.  Abnormal Exams, Studies: CT scan, abnormal, suspect malignancy.  Surveillance of: Last exam: Apr, 2007.  Comments: sudden onset of acute RLQ abd.pain 10 days ago, now getting better, CT scan showed circumferential thickening of the cecum, raising a possibility of an neoplasm, pt has been on several  HBP medications History  Current Medications: Patient is taking an non-steroidal medication. Patient is on an anticoagulant. Patient is not currently taking Coumadin.  Pre-Exam Physical: Performed Nov 26, 2006. Entire physical exam was normal.  Comments: Pt. history reviewed/updated, physical exam performed prior to initiation of sedation?yes Exam Exam: Extent of exam reached: Ileum, extent intended: Cecum.  The cecum was identified by appendiceal orifice and IC valve. Colon retroflexion performed. Images taken. ASA Classification: II. Tolerance: good.  Monitoring: Pulse and BP monitoring, Oximetry used. Supplemental O2 given.  Colon Prep Used Miralax for colon prep. Prep results: good.  Sedation Meds: Patient assessed and found to be appropriate for moderate (conscious) sedation. Fentanyl 100 mcg. given IV. Versed 10 mg. given IV.  Findings - NORMAL EXAM: Ileum. Biopsy/Normal Exam taken. Comments: normal terminal ileum to 15 cm.  - MUCOSAL ABNORMALITY: Cecum. Biopsy/Mucosal Abn. taken. ICD9: Colitis, Unspecified:  558.9. Comments: focal colitis, large ulcers, fiability and edema of the ileocecal valve, ,involving about 3-5 cm of the cecum, resembles ischemic colitis.  - NORMAL EXAM: Cecum. Comments: normal appendiceal opening.  - NORMAL EXAM: Rectum.    Comments: scope withdrawal time 11:30 min Assessment Abnormal examination, see findings above.  Diagnoses: 558.9: Colitis, Unspecified.   Comments: acute resolving cecal colitis, likely ischemic, consider s/p cecal volvuluc=s or s/p a low flow state due to multiple antihypertensive medications Events  Unplanned Interventions: No intervention was required.  Unplanned Events: There were no complications. Plans Medication Plan: Await pathology.  Comments: pt advised to monitor his blood pressure at home, have Dr Hetty Ely adjust his medications to avoid  possibility of hypotension( especially during the night). Cosider BE to  r/o  mobile cecum,  Disposition: After procedure patient sent to recovery. After recovery patient sent home.   This report was created from the original endoscopy report, which was reviewed and signed by the above listed endoscopist.       FINAL DIAGNOSIS    ***MICROSCOPIC EXAMINATION AND DIAGNOSIS***    1. CECUM, BIOPSY: ULCERATION WITH ASSOCIATED INFLAMMATION, SEE   COMMENT.    2. TERMINAL ILEUM BIOPSY: BENIGN SMALL BOWEL MUCOSA. NO   VILLOUS ATROPHY, INFLAMMATION OR OTHER ABNORMALITIES PRESENT.    COMMENT   1. The sections show mucosal ulceration with associated   inflammation. No granulomata are identified. The appearance is   nonspecific but ischemic colitis is in the differential   diagnosis. Clinical and endoscopic correlation is recommended.    2. There is small bowel mucosa with normal villous architecture   and no objective increase in inflammation. No villous atrophy,   active inflammation or other significant changes identified.   (BNS:caf 11/28/06)    cf   Date Reported:  11/28/2006 Havery Moros, MD   *** Electronically Signed Out By BNS ***    Clinical information   1. R/O ischemic colitis   2. R/O Crohn' s.   RLQ pain (mj)    specimen(s) obtained   1: Colon, biopsy, cecum   2: Ileum, biopsy, terminal    Gross Description   1. Received in formalin are tan, soft tissue fragments that are   submitted in toto. Number: Multiple   Size: < 0.1 cm up to 0.3 cm    2. Received in formalin are tan, soft tissue fragments that are   submitted in toto. Number: Multiple   Size: 0.2 cm up to 0.4 cm (SP:mj 11/27/06)    mj/

## 2010-10-18 NOTE — Letter (Signed)
Summary: [facility]   Imported By: Lamona Curl CMA (AAMA) 09/13/2010 15:49:41  _____________________________________________________________________  External Attachment:    Type:   Image     Comment:   External Document

## 2010-10-18 NOTE — Assessment & Plan Note (Signed)
Summary: f/u after EGD/[place]    History of Present Illness Visit Type: Follow-up Visit Primary GI MD: Lina Sar MD Primary Provider: Shaune Leeks MD Requesting Provider: na Chief Complaint: F/u from EGD. Pt states that he is doing better and denies any GI complaints  History of Present Illness:   This is a 65 year old white male with a history of esophageal stricture. He is status post recent upper endoscopy which showed spasm in the distal esophagus. He was dilated with 17 and 18 mm Savary dilators. He comes today to report on his  symptoms. He has no complaints. He denies dysphagia or odynophagia. He has been able to eat a regular diet. We have discussed the diagnosis of possible esophageal spasm , achalasia, versus stricture. If the dysphagia reccurs, we will schedule him for a barium esophagram or esophageal manometry. Additional medical problems include coronary artery disease, high blood pressure, glaucoma, diabetes, and cecal ulcers on a colonoscopy in March 2008 with family history of colon polyps. A recall colonoscopy will be due in March 2018.   GI Review of Systems      Denies abdominal pain, acid reflux, belching, bloating, chest pain, dysphagia with liquids, dysphagia with solids, heartburn, loss of appetite, nausea, vomiting, vomiting blood, weight loss, and  weight gain.        Denies anal fissure, black tarry stools, change in bowel habit, constipation, diarrhea, diverticulosis, fecal incontinence, heme positive stool, hemorrhoids, irritable bowel syndrome, jaundice, light color stool, liver problems, rectal bleeding, and  rectal pain.    Current Medications (verified): 1)  Spironolactone 25 Mg Tabs (Spironolactone) .Marland Kitchen.. 1 By Mouth Daily 2)  Metoprolol Tartrate 100 Mg Tabs (Metoprolol Tartrate) .Marland Kitchen.. 1 By Mouth Two Times A Day 3)  Lipitor 80 Mg Tabs (Atorvastatin Calcium) .... 1/2 By Mouth Daily 4)  Diovan Hct 160-25 Mg  Tabs (Valsartan-Hydrochlorothiazide) .Marland Kitchen.. 1  By Mouth Daily 5)  Norvasc 10 Mg  Tabs (Amlodipine Besylate) .Marland Kitchen.. 1 By Mouth Daily 6)  Adult Aspirin Ec Low Strength 81 Mg  Tbec (Aspirin) .... Take 1 Tablet By Mouth Once A Day 7)  Timolol Maleate 0.5 %  Soln (Timolol Maleate) .... As Directed 8)  Bl Multiple Vitamins   Tabs (Multiple Vitamins-Minerals) .... Take 1 Tablet By Mouth Once A Day 9)  Viagra 100 Mg  Tabs (Sildenafil Citrate) .... One Tab By Mouth 1 Hr Prior  Allergies (verified): No Known Drug Allergies  Past History:  Past Medical History: Hyperlipidemia (03/17/1999) Hypertension (03/17/1999) GERD (09/16/1996) Coronary artery disease (07/17/2004) Diverticulosis, colon (12/15/2005) Glaucoma Previous Tobacco Abuse (quit 2000) Esophageal Stricture Gastritis   Past Surgical History: Reviewed history from 09/18/2010 and no changes required. EDG acute food impaction- removed 09/16/00 EDG stricture distal esoph 10/08/00 Coronary artery disease (status post CABG Jul 27, 2004 by  Dr. Cornelius Moras with a LIMA to the LAD, RIMA the distal right coronary artery,   saphenous vein graft first diagonal, saphenous vein graft to first   circumflex marginal, sequential saphenous vein graft to the second  Colonoscopy divertics, mild 12/30/05 10 yrs Colonoscopy colitis bx neg 11/26/06 CABG  Family History: Reviewed history from 09/07/2010 and no changes required. father died 86 stroke x 2  mother died 40 diff eating/dysphagia pvd brother A  htn sister A   sister A   sister dec 63 breast ca Htn cv + bro mi HBP + bro, sist dm neg  breast ca sist died colon ca + polyps /o6 depression neg etoh/ drug abuse neg + stroke  father  Family History of Colon Polyps:Brother  Social History: Reviewed history from 07/17/2010 and no changes required. 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,  Tobacco Use - Former, quit 2000 alcohol: 3-4/day, bud light enjoys golf, fishing exercise: cycling 5 days a week,  stretching, weights  Review of Systems  The patient denies allergy/sinus, anemia, anxiety-new, arthritis/joint pain, back pain, blood in urine, breast changes/lumps, change in vision, confusion, cough, coughing up blood, depression-new, fainting, fatigue, fever, headaches-new, hearing problems, heart murmur, heart rhythm changes, itching, menstrual pain, muscle pains/cramps, night sweats, nosebleeds, pregnancy symptoms, shortness of breath, skin rash, sleeping problems, sore throat, swelling of feet/legs, swollen lymph glands, thirst - excessive , urination - excessive , urination changes/pain, urine leakage, vision changes, and voice change.         Pertinent positive and negative review of systems were noted in the above HPI. All other ROS was otherwise negative.   Vital Signs:  Patient profile:   65 year old male Height:      67 inches Weight:      189 pounds BMI:     29.71 BSA:     1.98 Pulse rate:   76 / minute Pulse rhythm:   regular BP sitting:   132 / 74  (left arm) Cuff size:   regular  Vitals Entered By: Ok Anis CMA (October 08, 2010 8:38 AM)   Impression & Recommendations:  Problem # 1:  Hx of ESOPHAGEAL STRICTURE (ICD-530.3) No stricture found but rather spasm in the distal esophagus. A 17 and 18 mm dilator seemed to be effective. If the dysphagia recurs, I suggest a barium esophagram and esophageal manometry.  Problem # 2:  GASTRITIS (ICD-535.50) Patient is H. pyloric negative. His gastritis is probably related to aspirin. No further treatment is needed.  Problem # 3:  CORONARY ARTERY DISEASE (ICD-414.00) Assessment: Comment Only  Problem # 4:  SPECIAL SCREENING MALIG NEOPLASMS OTHER SITES (ICD-V76.49) Patient has a family history of colon polyps and personal history of cecal ulcers. A recall colonoscopy will be due in March 2018.  Patient Instructions: 1)  No further evaluation needed. If dysphagia recurs, we may consider a barium esophagram and esophageal  manometry to rule out achalasia. 2)  Recall colonoscopy March 2018. 3)  Copy sent to : Dr Hetty Ely 4)  The medication list was reviewed and reconciled.  All changed / newly prescribed medications were explained.  A complete medication list was provided to the patient / caregiver.

## 2010-10-18 NOTE — Procedures (Signed)
Summary: EGD   EGD  Procedure date:  10/08/2000  Findings:      Location: [facility]   Patient Name: Alan, [name] MRN: [id number] Procedure Procedures: Panendoscopy (EGD) CPT: 43235.    with esophageal dilation. CPT: G9296129.  Personnel: Endoscopist: Huie Ghuman L. Juanda Chance, MD.  Exam Location: Exam performed in Endoscopy Suite.  Patient Consent: Procedure, Alternatives, Risks and Benefits discussed, consent obtained,  Indications  Evaluation of: esophageal stricture.  Therapeutics: Reason for exam: Esophageal dilation.  Symptoms: Dysphagia. s/p recent food impaction January 1 02.  Surveillance of: Last exam: Jan, 2002.  Comments: Savory dilation using 14, 15, 16, and 17 mm dilators,no blood on the dilator History  Pre-Exam Physical: Performed Oct 08, 2000  Cardio-pulmonary exam, HEENT exam, Abdominal exam, Extremity exam, Mental status exam WNL.  Exam Exam Info: Maximum depth of insertion Duodenum, intended Duodenum. Vocal cords visualized. Gastric retroflexion performed. Images taken. ASA Classification: I. Tolerance: good.  Sedation Meds: Demerol 50 mg. Versed 7 mg.  Monitoring: BP and pulse monitoring done. Oximetry used. Supplemental O2 given  Fluoroscopy: Fluoroscopy was used.  Findings STRICTURE / STENOSIS: Distal Esophagus.  Constriction: partial. Etiology: benign due to reflux. 37 cm from mouth. Lumen diameter is 14 mm. ICD9: Esophageal Stricture: 530.3.  - MUCOSAL ABNORMALITY: Body to Antrum. Erythematous mucosa. ICD9: Gastritis, Acute: 535.00.   Assessment Abnormal examination, see findings above.  Diagnoses: 530.3: Esophageal Stricture. s/p dilation to 17 mm.  535.00: Gastritis, Acute.   Events  Unplanned Intervention: No unplanned interventions were required.  Unplanned Events: There were no complications. Plans Medication(s): H2Blocker: Pepcid/Famotidine 20 mg prn, starting Oct 08, 2000   Patient Education: Patient given  standard instructions for: Stenosis / Stricture. chew carefully, eat slowly.  Disposition: After procedure patient sent to recovery.  Scheduling: Follow-up prn.  Comments: return in the future if dysphagia recurs  This report was created from the original endoscopy report, which was reviewed and signed by the above listed endoscopist.

## 2010-10-18 NOTE — Procedures (Signed)
Summary: Upper Endoscopy w/DIL  Patient: Alan [name] Note: All result statuses are Final unless otherwise noted.  Tests: (1) Upper Endoscopy w/DIL (UED)  UED Upper Endoscopy w/DIL                             DONE     [facility]     [address]     [place], [place]  [address]           ENDOSCOPY PROCEDURE REPORT           PATIENT:  [name], [name]  MR#:  [id number]     BIRTHDATE:  [birth year], 64 yrs. old  GENDER:  male           ENDOSCOPIST:  Hedwig Morton. Juanda Chance, MD     ASSISTANT:           PROCEDURE DATE:  09/25/2010     PROCEDURE:  EGD with dilatation over guidewire, EGD with biopsy     ASA CLASS:  Class II     INDICATIONS:  1) dysphagia hx of food impaction 2002, s/p dil to     17 mm, now recurrent dysphagia           MEDICATIONS:   Versed 9 mg, Fentanyl 10 mcg     TOPICAL ANESTHETIC:  Exactacain Spray           DESCRIPTION OF PROCEDURE:   After the risks benefits and     alternatives of the procedure were thoroughly explained, informed     consent was obtained.  The [id number] endoscope was     introduced through the mouth and advanced to the second portion of     the duodenum, without limitations.  The instrument was slowly     withdrawn as the mucosa was carefully examined.     <<PROCEDUREIMAGES>>           stenosis. closed lumen at g-e junction, "spasm", resistance to     passaage of the scope, no stricture Savary dilation over a     guidewire was performed (see image1 and image2). 17,42mm dilators     passed over the guidewire  Moderate gastritis was found in the     antrum. With standard forceps, a biopsy was obtained and sent to     pathology (see image3, image5, and image6).  The examination was     otherwise normal (see image7 and image4).    Dilation was then     performed at the total esophagus           1) Dilator:  Savary over guidewire  Size(s):     Resistance:  moderate  Heme:  none     Appearance:  adequate           COMPLICATIONS:   None           ENDOSCOPIC IMPRESSION:     1) Stenosis     2) Moderate gastritis in the antrum     3) Otherwise normal examination.     hypertensive LES, no stricture, s/p passage of 17 and 18 mm     dilator     RECOMMENDATIONS:     1) await biopsy results     consider Ba esophagram and manometry, needs follow up OV           REPEAT EXAM:  In 0 year(s) for.  redilate prn, last dilation     lasted  10 years           ______________________________     Hedwig Morton. Juanda Chance, MD           CC:  Laurita Quint, M.D.           n.     eSIGNED:   Hedwig Morton. Damar Petit at 09/25/2010 09:10 AM           Barbie Banner, [phone]  Note: An exclamation mark (!) indicates a result that was not dispersed into the flowsheet. Document Creation Date: 09/25/2010 9:11 AM _______________________________________________________________________  (1) Order result status: Final Collection or observation date-time: 09/25/2010 08:56 Requested date-time:  Receipt date-time:  Reported date-time:  Referring Physician:   Ordering Physician: Lina Sar [phone]) Specimen Source:  Source: Launa Grill Order Number: [id number] Lab site:   Appended Document: Upper Endoscopy w/DIL Patient scheduled for 10/08/10 at 8:45 AM. Patient aware.

## 2010-10-18 NOTE — Letter (Signed)
Summary: Patient Notice-Endo Biopsy Results  [facility]  [address] [place], [place] [address]   Phone: [phone]  Fax: [phone]        October 01, 2010 MRN: [id number]    Alan [name] [address] [place], [place]  [address]    Dear Mr. GOIN,  I am pleased to inform you that the biopsies taken during your recent endoscopic examination did not show any evidence of cancer upon pathologic examination.The biopsies show  gastritis.  Additional information/recommendations:  __No further action is needed at this time.  Please follow-up with      your primary care physician for your other healthcare needs.  __ Please call [phone] to schedule a return visit to review      your condition.  _x_ Continue with the treatment plan as outlined on the day of your      exam.  _   Please call us if you are having persistent problems or have questions about your condition that have not been fully answered at this time.  Sincerely,  Hart Carwin MD  This letter has been electronically signed by your physician.  Appended Document: Patient Notice-Endo Biopsy Results Letter Mailed

## 2010-11-26 ENCOUNTER — Encounter: Payer: Self-pay | Admitting: Cardiology

## 2010-11-26 ENCOUNTER — Ambulatory Visit (INDEPENDENT_AMBULATORY_CARE_PROVIDER_SITE_OTHER): Payer: 59 | Admitting: Cardiology

## 2010-11-26 DIAGNOSIS — I2510 Atherosclerotic heart disease of native coronary artery without angina pectoris: Secondary | ICD-10-CM

## 2010-12-04 NOTE — Assessment & Plan Note (Signed)
Summary: fu appt/mt      Allergies Added: NKDA  Visit Type:  Follow-up Primary Provider:  Dr. Para March  CC:  CAD.  History of Present Illness: The patient presents for followup of his known coronary disease. Since I last saw him he has had no new cardiovascular complaints. He continues to exercise 5 days per week on a bicycle. Last year he had an exercise treadmill test which demonstrated no evidence of ischemia. He denies any chest pressure, neck or arm discomfort. He has had no palpitations, presyncope or syncope. He has had no PND or orthopnea.  Of note he didn't have his esophagus stretched which improved some symptoms he was having with swallowing.  Current Medications (verified): 1)  Spironolactone 25 Mg Tabs (Spironolactone) .Marland Kitchen.. 1 By Mouth Daily 2)  Metoprolol Tartrate 100 Mg Tabs (Metoprolol Tartrate) .Marland Kitchen.. 1 By Mouth Two Times A Day 3)  Lipitor 80 Mg Tabs (Atorvastatin Calcium) .... 1/2 By Mouth Daily 4)  Diovan Hct 160-25 Mg  Tabs (Valsartan-Hydrochlorothiazide) .Marland Kitchen.. 1 By Mouth Daily 5)  Norvasc 10 Mg  Tabs (Amlodipine Besylate) .Marland Kitchen.. 1 By Mouth Daily 6)  Adult Aspirin Ec Low Strength 81 Mg  Tbec (Aspirin) .... Take 1 Tablet By Mouth Once A Day 7)  Timolol Maleate 0.5 %  Soln (Timolol Maleate) .... As Directed 8)  Bl Multiple Vitamins   Tabs (Multiple Vitamins-Minerals) .... Take 1 Tablet By Mouth Once A Day 9)  Viagra 100 Mg  Tabs (Sildenafil Citrate) .... One Tab By Mouth 1 Hr Prior  Allergies (verified): No Known Drug Allergies  Past History:  Past Medical History: Reviewed history from 10/08/2010 and no changes required. Hyperlipidemia (03/17/1999) Hypertension (03/17/1999) GERD (09/16/1996) Coronary artery disease (07/17/2004) Diverticulosis, colon (12/15/2005) Glaucoma Previous Tobacco Abuse (quit 2000) Esophageal Stricture Gastritis   Past Surgical History: Reviewed history from 09/18/2010 and no changes required. EDG acute food impaction- removed  09/16/00 EDG stricture distal esoph 10/08/00 Coronary artery disease (status post CABG Jul 27, 2004 by  Dr. Cornelius Moras with a LIMA to the LAD, RIMA the distal right coronary artery,   saphenous vein graft first diagonal, saphenous vein graft to first   circumflex marginal, sequential saphenous vein graft to the second  Colonoscopy divertics, mild 12/30/05 10 yrs Colonoscopy colitis bx neg 11/26/06 CABG  Review of Systems       As stated in the HPI and negative for all other systems.   Vital Signs:  Patient profile:   65 year old male Height:      67 inches Weight:      188 pounds BMI:     29.55 Pulse rate:   61 / minute Resp:     16 per minute BP sitting:   138 / 88  (right arm)  Vitals Entered By: Marrion Coy, CNA (November 26, 2010 11:10 AM)  Physical Exam  General:  Well developed, well nourished, he has a raspy voice Head:  Normocephalic and atraumatic without obvious abnormalities. No apparent alopecia or balding. Eyes:  PERRLA/EOM intact; conjunctiva and lids normal. Neck:  Neck supple, no JVD. No masses, thyromegaly or abnormal cervical nodes. Chest Wall:  well healed sternotomy scar Lungs:  Clear throughout to auscultation. Abdomen:  Bowel sounds positive; abdomen soft and non-tender without masses, organomegaly, or hernias noted. No hepatosplenomegaly. Msk:  Back normal, normal gait. Muscle strength and tone normal. Extremities:  No clubbing or cyanosis. Neurologic:  Alert and oriented x 3. Skin:  Intact without lesions or rashes. Cervical Nodes:  no  significant adenopathy Inguinal Nodes:  no significant adenopathy Psych:  Normal affect.   Detailed Cardiovascular Exam  Neck    Carotids: Carotids full and equal bilaterally without bruits.      Neck Veins: Normal, no JVD.    Heart    Inspection: no deformities or lifts noted.      Palpation: normal PMI with no thrills palpable.      Auscultation: regular rate and rhythm, S1, S2 without murmurs, rubs, gallops, or  clicks.    Vascular    Abdominal Aorta: no palpable masses, pulsations, or audible bruits.      Femoral Pulses: normal femoral pulses bilaterally.      Pedal Pulses: normal pedal pulses bilaterally.      Radial Pulses: normal radial pulses bilaterally.      Peripheral Circulation: no clubbing, cyanosis, or edema noted with normal capillary refill.     EKG  Procedure date:  11/26/2010  Findings:      Sinus rhythm, rate 60 to, axis within normal limits, intervals within normal limits, no acute ST-T wave changes  Impression & Recommendations:  Problem # 1:  CORONARY ARTERY BYPASS GRAFT, HX OF (ICD-V45.81) He is having no new symptoms. No change in therapy is indicated. He will continue with meds as listed. In the absence of new symptoms I will most likely screen him again in a couple of years with an exercise treadmill test. Orders: EKG w/ Interpretation (93000)  Problem # 2:  HYPERTENSION (ICD-401.9) His blood pressure is controlled and he will continue meds as listed.  Problem # 3:  HYPERLIPIDEMIA (ICD-272.4) In October his LDL is 35 with an HDL of 52. This is excellent and he will continue meds as listed.  Patient Instructions: 1)  Your physician recommends that you schedule a follow-up appointment in: 1 year with Dr Antoine Poche 2)  Your physician recommends that you continue on your current medications as directed. Please refer to the Current Medication list given to you today.

## 2010-12-05 ENCOUNTER — Other Ambulatory Visit: Payer: Self-pay | Admitting: Family Medicine

## 2010-12-05 MED ORDER — SPIRONOLACTONE 25 MG PO TABS: 25.0000 mg | ORAL_TABLET | Freq: Every day | ORAL | Status: DC

## 2010-12-05 NOTE — Telephone Encounter (Signed)
Assume [employer] wants 90 and 3 not 30 and 11. Script changed.

## 2010-12-07 ENCOUNTER — Other Ambulatory Visit: Payer: Self-pay | Admitting: *Deleted

## 2010-12-07 NOTE — Telephone Encounter (Signed)
Faxed to pharmacy

## 2010-12-07 NOTE — Telephone Encounter (Signed)
Signed, please send in.  Thanks.

## 2010-12-10 MED ORDER — VALSARTAN-HYDROCHLOROTHIAZIDE 160-25 MG PO TABS: 1.0000 | ORAL_TABLET | Freq: Every day | ORAL | Status: DC

## 2010-12-11 ENCOUNTER — Telehealth: Payer: Self-pay | Admitting: *Deleted

## 2010-12-11 NOTE — Telephone Encounter (Signed)
[employer] is asking for clarification on diovan script, form is on your desk.

## 2010-12-12 NOTE — Telephone Encounter (Signed)
I will address on the hard copy.

## 2010-12-24 ENCOUNTER — Ambulatory Visit: Payer: 59 | Admitting: Family Medicine

## 2010-12-25 ENCOUNTER — Other Ambulatory Visit: Payer: Self-pay | Admitting: *Deleted

## 2010-12-25 MED ORDER — AMLODIPINE BESYLATE 10 MG PO TABS: 10.0000 mg | ORAL_TABLET | Freq: Every day | ORAL | Status: DC

## 2010-12-26 ENCOUNTER — Other Ambulatory Visit: Payer: Self-pay | Admitting: *Deleted

## 2010-12-26 MED ORDER — AMLODIPINE BESYLATE 10 MG PO TABS: 10.0000 mg | ORAL_TABLET | Freq: Every day | ORAL | Status: DC

## 2011-01-29 NOTE — Assessment & Plan Note (Signed)
[facility] OFFICE NOTE   NAME:Alan [name], Alan [name]                      MRN:          [id number]  DATE:12/03/2007                            DOB:          Aug 17, 1946    PRIMARY:  Dr. Hetty Ely.   REASON FOR PRESENTATION:  Evaluate the patient with coronary disease.   HISTORY OF PRESENT ILLNESS:  The patient is a pleasant 65 year old  gentleman with a history of coronary disease status post CABG.  He was  followed previously by Dr. Samule Ohm.  Since he had his bypass surgery in  2005, he has done quite well.  That time he had chest pain and a non-Q-  wave myocardial infarction.  He has had no symptoms since then.  He  works out every day.  He does a bicycle 3 times a week.  With this  vigorous level of activity, denies any chest discomfort neck or arm  discomfort.  Had no palpitation, presyncope, syncope.  Had no PND or  orthopnea.   PAST MEDICAL HISTORY:  Coronary artery disease (status post CABG 2005 a  Dr. Cornelius Moras with a LIMA to the LAD, RIMA the distal right coronary artery,  saphenous vein graft first diagonal, saphenous vein graft to first  circumflex marginal, sequential saphenous vein graft to the second  circumflex marginal), glaucoma, dyslipidemia, hypertension, esophageal  dilatation for esophageal stricture, previous tobacco use, quitting in  2000.   ALLERGIES INTOLERANCES:  None.   MEDICATIONS:  1. Aspirin 81 mg daily.  2. Metoprolol 50 mg b.i.d.  3. Timoptic eyedrops.  4. Metoprolol 100 mg b.i.d.  5. Diovan 160/25 one-half tablet daily.  6. Norvasc 7.5 mg daily.  7. Spironolactone 25 mg daily.  8. Lipitor 40 mg daily.   REVIEW OF SYSTEMS:  As stated in HPI, otherwise negative other systems.   PHYSICAL EXAMINATION:  The patient is in no distress.  Blood pressure 128/88, heart rate 78 and regular.  HEENT:  Eyelids unremarkable.  Pupils are equal, round, and reactive to  light and accommodation.   Fundi are not visualized.  Oral mucosa  unremarkable.  NECK:  No jugular venous distension at 45 degrees, carotid upstroke  brisk and symmetric, no bruits, thyromegaly.  LYMPHATICS:  No cervical, axillary, or inguinal adenopathy.  LUNGS:  Clear to auscultation bilaterally.  BACK:  No costovertebral angle tenderness.  CHEST:  Well-healed sternotomy scar.  HEART:  PMI not displaced or sustained, S1 and S2 within normal limits,  no S3, no S4, no clicks, rubs, murmurs.  ABDOMEN:  Flat, positive bowel sounds, normal in frequency and pitch, no  bruits, rebound, guarding.  No midline pulsatile masses, hepatomegaly,  splenomegaly.  SKIN:  No rashes, no nodules.  EXTREMITIES:  With 2+ pulses throughout, no edema, cyanosis, clubbing.  NEURO:  Oriented to person, place, and time, cranial nerves 2-12 grossly  intact, motor grossly intact.    EKG sinus rhythm, rate 74, axis within normal limits, intervals within  normal limits, no acute ST-T wave changes.   ASSESSMENT/PLAN:  1. Coronary disease.  Patient is having no new symptoms.  No further      cardiovascular testing is suggested.  Continue with aggressive risk      reduction.  2. Dyslipidemia.  Would like to obtain the results of his most recent      lipid profile for review.  The goal the LDL less than 100, HDL      greater than 40.  3. Hypertension.  Blood pressure is well-controlled and he will      continue medications as listed.  4. Risk reduction.  We did discuss at length his exercise regimen.  I      have encouraged 5 days of exercise at 30 minutes at a time a      aerobic.  He is doing three.   FOLLOWUP:  Come back in 1 year or sooner if needed.     Rollene Rotunda, MD, [facility]  Electronically Signed    JH/MedQ  DD: 12/03/2007  DT: 12/03/2007  Job #: [id number]   cc:   Arta Silence, MD

## 2011-02-01 NOTE — Discharge Summary (Signed)
NAME:  Alan [name], HUBBLE NO.:  [id number]   MEDICAL RECORD NO.:  [id number]          PATIENT TYPE:  INP   LOCATION:  2011                         FACILITY:  [facility]   PHYSICIAN:  Salvatore Decent. Cornelius Moras, M.D. DATE OF BIRTH:  Jul 14, 1946   DATE OF ADMISSION:  07/18/2004  DATE OF DISCHARGE:  07/24/2004                                 DISCHARGE SUMMARY   PRIMARY ADMITTING DIAGNOSIS:  Chest pain.   ADDITIONAL/DISCHARGE DIAGNOSES:  1.  Severe three vessel coronary artery disease.  2.  Acute coronary syndrome.  3.  Hypertension.  4.  Borderline hyperlipidemia.  5.  Remote history of tobacco use.  6.  Remote history of esophageal dilatation for lower esophageal stricture.  7.  History of glaucoma.   PROCEDURES PERFORMED:  1.  Cardiac catheterization.  2.  Coronary artery bypass grafting x5 (left internal mammary artery to      distal LAD, right internal mammary artery to the distal right coronary      artery, saphenous vein graft to the first diagonal, saphenous vein graft      to the first circumflex marginal and sequential saphenous vein graft to      the second circumflex marginal).  3.  Endoscopic vein harvest right thigh.   HISTORY OF PRESENT ILLNESS:  The patient is a 65 year old white male with no  previous history of coronary artery disease.  Several days prior to  admission, he began to have chest discomfort with minimal exertion.  This  persisted throughout the day and he had discomfort with even minimal  activity later in the day.  He initially was seen in his primary care  physician's office and was subsequently referred to the emergency department  for further evaluation.  Upon presentation in the emergency department he  was noted to have  EKG changes suggestive of left main or multivessel  coronary artery disease.  There were ST depressions inferiorly and in leads  III through V6 with ST elevation in leads aVR. These were new abnormalities  compared to the EKG  which was performed earlier in the afternoon in Dr.  Lorenza Chick office.  Because of his symptoms and his EKG changes, he was seen  in consultation by Dr. Randa Evens for cardiology.  He was started on  aspirin, heparin and beta-blockade and was taken urgently to the cardiac  catheterization lab for cardiac catheterization.   HOSPITAL COURSE:  He underwent cardiac catheterization which showed severe  three vessel coronary artery disease.  He did have well preserved left  ventricular function.  Specifically, he was noted to have an 80% stenosis of  the mid LAD followed by an 80% stenosis of the distal LAD, a 90% proximal  stenosis of the left circumflex coronary, a 70 to 80% ostial stenosis of the  first circumflex marginal and 80% stenosis of the proximal right coronary  artery.  His cardiac enzymes were mildly positive with total CK of 165, CK-  MB 7.6 and peak troponin of 0.2.  Because of these findings, cardiothoracic  surgery consultation was obtained.  The patient was seen by Dr.  Tressie Stalker and his films were reviewed.  Dr. Cornelius Moras felt that his best course of  action would be to proceed with surgical revascularization at this time.  After explanation of the risks, benefits, and alternatives of the procedure,  the patient agreed to proceed.  He was taken to the operating room on  July 19, 2004.  He underwent CABG x5 as described in detail above  performed by Dr. Cornelius Moras.  He tolerated the procedure well and was transferred  to the SICU in stable condition.  He was able to be extubated shortly after  surgery.  He was hemodynamically stable and doing well on postoperative day  #1.  At that time, his hemodynamic monitoring devices and chest tubes were  removed.  He was kept in the unit for overnight observation.  He was started  on a diuretic and a beta-blocker and was mobilized with cardiac rehab phase  I.   By postoperative day #2, he was making good progress and was able to be   transferred to the floor.  Postoperatively, he has done well.  He has had  some sinus tachycardia and his beta-blocker dose has been titrated upward as  tolerated.  Presently he is maintaining sinus rhythm with rates in the 90s.  His blood pressures remained stable with systolics in the 110s to 130s/60s  diastolic.  He has been afebrile and all vital signs are stable.  He has  been diuresed back down to his preoperative weight.  He has been weaned from  supplemental oxygen and is maintaining O2 saturations of greater than 90% on  room air.  His surgical incision sites are all healing well.  He is  ambulating in the halls with cardiac rehab phase I as well as independently  and is making good progress.  He is tolerating a regular diet and is having  normal bowel and bladder function.  His most recent labs show a sodium of  143, potassium 3.8, BUN 8, creatinine 1.1.  Hemoglobin 28.2, white count  8.4,  platelets 118.  His most recent x-ray showed bibasilar atelectasis,  otherwise clear.  It is felt if he continues to remain stable over the next  24 hours and no acute changes occur, he will hopefully be ready for  discharge home on July 24, 2004.   DISCHARGE MEDICATIONS:  1.  Enteric coated aspirin 325 mg daily.  2.  Lopressor 25 mg t.i.d.  3.  Folic acid 1 mg daily.  4.  Timoptic eye drops daily as directed.  5.  Tylox one to two q.4h. p.r.n. for pain.   DISCHARGE INSTRUCTIONS:  1.  He is asked to refrain from driving, heavy lifting or strenuous      activity.  He may continue ambulating daily and using his incentive      spirometer.  2.  He may shower daily and clean his incisions with soap and water.  3.  He will continue a low fat, low sodium diet.   FOLLOW UP:  He will be scheduled to see Dr. Samule Ohm back in the office in two  weeks and will have a chest x-ray at that visit.  He will then follow up in the [facility] office with Dr. Cornelius Moras on Monday, August 13, 2004, at 11:15 a.m.  He  should bring his chest x-ray to this appointment for Dr. Cornelius Moras to review.  In the interim, if he experienced any problems or has questions he is asked  to contact our  office immediately.       GC/MEDQ  D:  07/23/2004  T:  07/23/2004  Job:  [id number]   cc:   Salvadore Farber, M.D. [facility]  [address]  [address]  [place]  [place] [address]   Laurita Quint, M.D.  [address] [place]  [place] [address]  Fax: [phone]

## 2011-02-01 NOTE — Cardiovascular Report (Signed)
NAME:  TRELL, SECRIST NO.:  [id number]   MEDICAL RECORD NO.:  [id number]          PATIENT TYPE:  INP   LOCATION:  1823                         FACILITY:  [facility]   PHYSICIAN:  Salvadore Farber, M.D. LHCDATE OF BIRTH:  May 19, 1946   DATE OF PROCEDURE:  07/18/2004  DATE OF DISCHARGE:                              CARDIAC CATHETERIZATION   PROCEDURES:  1.  Left heart catheterization.  2.  Left ventriculography.  3.  Coronary angiography.   INDICATIONS:  Mr. Hallums is a 65 year old without prior history of  cardiovascular disease.  He presents with three days of unstable angina.  En  route to the emergency room he developed rest pain.  He subsequently became  pain-free.  Electrocardiogram demonstrated ST elevation in aVR, with diffuse  ST depressions elsewhere.  With his high risk EKG, he was brought urgently  to the cardiac catheterization lab.   PROCEDURAL TECHNIQUE:  Informed consent was obtained.  He underwent 1%  lidocaine anesthesia, and a 6-French sheath was placed in the right common  femoral artery using the modified Seldinger technique.  Diagnostic  angiography and ventriculography were performed, using JL4, AL1, and pigtail  catheters.  The patient tolerated the procedure well and was transferred to  the holding room in stable condition; sheaths will removed there.   COMPLICATIONS:  None.   FINDINGS:   HEMODYNAMIC DATA:  1.  Left ventricular pressure:  129/9 mean 24.  2.  Ejection fraction 65% without regional wall motion abnormalities.  3.  No aortic stenosis or mitral regurgitation.   CORONARY ANGIOGRAPHY:  1.  LEFT MAIN:  Angiographically normal.  It is a very short vessel.  2.  LEFT ANTERIOR DESCENDING ARTERY:  A moderate sized vessel, giving rise      to two small proximal diagonals and a larger third diagonal.  There is a      70% hazy stenosis of the mid LAD, between the two small diagonals.      There is then 70% stenosis of the mid LAD, just  after the takeoff of the      third diagonal.  3.  CIRCUMFLEX CORONARY ARTERY:  Ulcerated 90% stenosis proximally; 70%      stenosis in the ostium of the moderate sized first obtuse marginal.  4.  RIGHT CORONARY ARTERY:  There is a high anterior takeoff to the vessel.      The vessel was large and dominant.  There was a long 80% proximal      stenosis, followed by a 50% stenosis in the mid vessel.   IMPRESSION/RECOMMENDATIONS:  Mr. Woodberry has severe three-vessel coronary  disease, with involvement of the LAD-diagonal bifurcation.  Due to his  bifurcation disease, I recommend coronary artery bypass grafting.  Discussed  with Dr. Tyrone Sage.  Will continue aspirin, heparin, beta blocker and  initiate statin.       WED/MEDQ  D:  07/18/2004  T:  07/18/2004  Job:  [id number]   cc:   Laurita Quint, M.D.  [address] [place]  [place] [address]  Fax: [phone]

## 2011-02-01 NOTE — Consult Note (Signed)
NAME:  Alan [name], Alan [name] NO.:  [id number]   MEDICAL RECORD NO.:  [id number]          PATIENT TYPE:  INP   LOCATION:  1823                         FACILITY:  [facility]   PHYSICIAN:  Salvatore Decent. Cornelius Moras, M.D. DATE OF BIRTH:  December 31, 1945   DATE OF CONSULTATION:  07/18/2004  DATE OF DISCHARGE:                                   CONSULTATION   REASON FOR CONSULTATION:  Severe three vessel coronary artery disease with  acute coronary syndrome.   HISTORY OF PRESENT ILLNESS:  Mr. Hoben is a 65 year old white male with no  previous cardiac history but risk factors notable for a history of  hypertension, borderline hyperlipidemia, a strong family history of coronary  artery disease, and a remote history of tobacco use.  The patient developed  new onset symptoms of angina four days ago with classical symptoms  associated with physical activity.  The symptoms have recurred until today  at which time the patient developed several recurrent and more persistent  episodes of pain prompting him to present for evaluation.  The patient had  baseline electrocardiogram demonstrating sinus tachycardia with ST segment  abnormalities suggestive of ischemia but without significant ST segment  elevation.  Initial set of cardiac enzymes were positive with a total CK of  165 and CK MB fraction of 7.6 and a troponin I of 0.20.  The patient was  brought for cardiac catheterization by Dr. Samule Ohm and findings at  catheterization were notable for severe three vessel coronary artery disease  with preserved left ventricular function.  Cardiac surgical consultation was  requested.   REVIEW OF SYMPTOMS:  GENERAL:  The patient reports feeling well with good  appetite.  He has developed some tendency towards worsening exertional  fatigue.  CARDIAC:  Notable for the absence of symptoms of chest pain prior  to this past Sunday.  He denies any associated symptoms of shortness of  breath either with activity or at  rest.  He denies PND, orthopnea, lower  extremity edema, palpitations, syncope.  RESPIRATORY:  Negative.  The  patient denies shortness of breath.  The patient has no productive cough,  hemoptysis, wheezing.  GASTROINTESTINAL:  Negative.  The patient has no  difficulty swallowing although he does have a remote history of lower  esophageal stricture in the past that was dilated.  The patient reports  normal bowel function.  He denies a history of hematochezia, hematemesis, or  melena.  MUSCULOSKELETAL:  Negative.  The patient denies arthritis or  arthralgias.  PERIPHERAL VASCULAR:  Negative.  The patient denies symptoms  of claudication.  GENITOURINARY:  Negative.  INFECTIOUS:  Negative.  HEENT:  Negative.  PSYCHIATRIC:  Negative.  The patient denies being under recent  stress or depression.  HEENT:  Negative.   PAST MEDICAL HISTORY:  1.  Hypertension.  2.  Borderline hyperlipidemia.  3.  Remote history of tobacco use.  4.  Remote history of lower esophageal stricture status post esophageal      dilatation.   PAST SURGICAL HISTORY:  None.   FAMILY HISTORY:  Notable for the strong presence of coronary  artery disease  with brother who has had two heart attacks and bypass surgery in his 67s and  father who died of a stroke and heart attack in his 68s.   SOCIAL HISTORY:  The patient is married and lives with his wife in  [place].  He works as a Merchandiser, retail for [employer].  The patient has a  remote history of tobacco use, although he quit smoking in 2000.  Prior to  that, he had a 40-pack-year history of tobacco use.  The patient denies  history of excessive alcohol consumption, although he does drink beer almost  every evening.   MEDICATIONS PRIOR TO ADMISSION:  Norvasc, Atenolol, and Timoptic eyedrops.   ALLERGIES:  None known.   PHYSICAL EXAMINATION:  GENERAL:  The patient is a well appearing male who appears his stated age in  no acute distress.  VITAL SIGNS:  Blood  pressure 150/86, pulse 100 and regular, he is afebrile,  oxygen saturation 95% on room air.  HEENT:  Within normal limits.  NECK:  Supple, there is no cervical or supraclavicular lymphadenopathy.  There is no jugular venous distention.  No carotid bruits are noted.  CHEST:  Auscultation of the chest includes clear and symmetrical breath  sounds bilaterally.  No wheezes or rhonchi are noted.  CARDIOVASCULAR:  Regular rate and rhythm.  No murmurs, rubs, or gallops are  noted.  ABDOMEN:  Soft, nontender, there are no palpable masses, bowel sounds  present.  EXTREMITIES:  Warm and well perfused.  There is no lower extremity edema.  Distal pulses are easily palpable in both lower legs at the ankle.  There is  no sign of significant venous insufficiency.  SKIN:  Clean and dry, healthy appearing throughout.  RECTAL AND GU EXAM:  Both deferred.  NEUROLOGICAL:  Examination is grossly nonfocal and symmetrical throughout.   DIAGNOSTIC TESTS:  Cardiac catheterization performed by Dr. Samule Ohm today is  reviewed.  This demonstrates severe three vessel coronary artery disease  with preserved left ventricular  function.  Specifically, there is an 80%  hazy stenosis of the mid left anterior descending  coronary artery followed  by 80% stenosis of the distal left anterior descending coronary artery just  after take off of a large third diagonal branch.  There is a 90% hazy  proximal stenosis of the left circumflex coronary artery with 70-80% ostial  stenosis of the first circumflex marginal branch.  There is 80% stenosis of  the proximal right coronary artery with right dominant coronary circulation.  Left ventricular function appears preserved.   IMPRESSION:  Severe three vessel coronary artery disease with acute coronary  syndrome and unstable angina and mildly positive cardiac enzymes.  I believe that Mr. Lipton would best be treated by elective coronary artery bypass  grafting.   PLAN:  I have  outlined options at length with Mr. Shostak and his family.  Alternative treatment strategies have been discussed.  They understand and  accept all associated risks of surgery including but not limited to the  risks of death,  stroke, myocardial infarction, congestive heart failure, respiratory  failure, pneumonia, bleeding requiring blood transfusion, arrhythmia,  infection, and recurrent coronary artery disease.  We tentatively plan to  proceed with surgery tomorrow.  All of their questions have been addressed.       CHO/MEDQ  D:  07/18/2004  T:  07/18/2004  Job:  [id number]   cc:   Salvadore Farber, M.D. [facility]  [address]  [place]  [place] [address]

## 2011-02-01 NOTE — Assessment & Plan Note (Signed)
[facility]                         GASTROENTEROLOGY OFFICE NOTE   NAME:Alan [name], Alan [name]                        MRN:          [id number]  DATE:11/25/2006                            DOB:          1946/06/18    Alan [name] is a very nice 65 year old gentleman who is here today because  of acute right lower quadrant abdominal pain which started exactly 10  days ago and persisted for several days and only slowly was getting  better in past several days the pain has resolved completely. The pain  was anterior in location and was severe enough that the patient missed 2  days of work. It was associated with low grade temperature and abnormal  bowel habits having soft stringy stools but no diarrhea, and no rectal  bleeding. CT scan of the abdomen after seeing Dr. Hetty Ely on March 3rd,  showed      circumferential thickening of the cecal pouch including the  ileocecal valve.  There is some surrounding strandiness, and haziness in  the cecal area. The changes were either suggestive of acute colitis or  possibly a mass. Alan [name] had a screening colonoscopy in April 2007,  almost a year ago which showed only mild diverticulosis of the left  colon. There were no diverticula in the right colon and there were no  polyps. On the colonoscopy report video photograph of the cecal pouch  was obtained to confirm the appearance of the cecal pouch.   Patient also has a history of esophageal stricture, he is an ex-smoker,  he had coronary artery disease, high blood pressure, glaucoma, and  family history of colon polyps.   PHYSICAL EXAMINATION:  Blood pressure 100/58, pulse 60, and weight 178  pounds. He was alert and oriented in no distress.  LUNGS: Clear to auscultation.  COR: Normal S1, S2.  ABDOMEN: Soft with normoactive bowel sounds, I could not elict any  tenderness today in right lower quadrant, which appeared to be normal,  there was no fullness, no guarding. Right  upper quadrant was normal,  liver edge at costal margin.  RECTAL EXAM: Shows soft trace Hemoccult positive stool. Patient reported  some rectal irritation early this morning.   IMPRESSION:  A 64 year old white male with acute right lower quadrant  abdominal pain and suggestion of an inflammatory or possible neoplastic  process in the cecum. The symptoms are almost resolved at this point,  but he remains trace Hemoccult positive on my exam today. He had a  colonoscopy to the cecum a year ago. The possible explanation for the  current abnormalities on the CT scan is intermittent cecal volvulus,  which is hard to diagnosed, or ischemic colitis.  He also might have had  infectious ileitis or gastroenteritis.The ischemic colitis, is usually  localized at  the splenic flexure.He is on several antihypertensive  medications, which could induce a low flow state resulting in transient  bowl ischemia. Patient is mainly concerned about a possibility of  cancer. There is always a possibility of error in a colonoscopy exam and  I feel the patient wants  to have a  colonoscopy to make sure that we did  not miss anything in the cecal pouch last year.   PLAN:  1. Colonoscopy tomorrow with routine colonoscopy prep.  2. Depending on the findings, if it is colitis or nonspecific ileitis      it seems to be resolving without any help of antibiotics, we will      make sure that there is no evidence of inflammatory bowel disease.   DISPOSITION:  Will really depend on the results of the colonoscopy. The  patient is off Plavix and aspirin now so we can go ahead a prep him  today.     Hedwig Morton. Juanda Chance, MD  Electronically Signed    DMB/MedQ  DD: 11/25/2006  DT: 11/27/2006  Job #: [id number]   cc:   Arta Silence, MD

## 2011-02-01 NOTE — Assessment & Plan Note (Signed)
[facility]                                 ON-CALL NOTE   NAME:ALLENMechel, Schutter                      MRN:          [id number]  DATE:11/17/2006                            DOB:          03-06-1946    TIME OF INTERACTION:  9:20 p.m.   PHONE NUMBER:  [phone].   CALLER:  Baird Lyons with [facility] radiology.   The patient was seen today by me and diagnosed with right lower quadrant  pain, rule out appendicitis.  Was sent for a CT scan.  Had blood work  prior.   OBJECTIVE:  Right lower quadrant pain.   CT showed right lower quadrant cecal mass wall.  The area was inflamed,  consistent with neoplasm versus focal colitis.  Appendix was normal.  There was no adenopathy noted.  The superior mesenteric vein and artery  were patent, supermesenteric veins and arteries, and no gallstones were  noted.   The patient was told he did not have appendicitis or gallbladder  disease, and will get back with him tomorrow.   PRIMARY CARE Karolina Zamor:  Arta Silence, M.D.  Home office is [facility].     Arta Silence, MD  Electronically Signed    RNS/MedQ  DD: 11/17/2006  DT: 11/18/2006  Job #: [id number]

## 2011-02-01 NOTE — Op Note (Signed)
NAME:  Alan [name], Alan [name] NO.:  [id number]   MEDICAL RECORD NO.:  [id number]          PATIENT TYPE:  INP   LOCATION:  2399                         FACILITY:  [facility]   PHYSICIAN:  Salvatore Decent. Cornelius Moras, M.D. DATE OF BIRTH:  [birth year]   DATE OF PROCEDURE:  07/19/2004  DATE OF DISCHARGE:                                 OPERATIVE REPORT   PREOPERATIVE DIAGNOSIS:  Severe three-vessel coronary artery disease, status  post acute non-Q wave myocardial infarction.   POSTOPERATIVE DIAGNOSIS:  Severe three-vessel coronary artery disease,  status post acute non-Q wave myocardial infarction.   OPERATION PERFORMED:  Median sternotomy for coronary artery bypass grafting  times five (left internal mammary artery to distal left anterior descending  coronary artery, right internal mammary artery to distal right coronary  artery, saphenous vein graft to first diagonal branch, saphenous vein graft  to first circumflex marginal branch and sequential saphenous vein graft to  second circumflex marginal branch), endoscopic saphenous vein harvest from  right thigh.   SURGEON:  Salvatore Decent. Cornelius Moras, M.D.   ASSISTANT:  Eber Jones A. Eustaquio Boyden.   ANESTHESIA:  General.   INDICATIONS FOR PROCEDURE:  The patient is a 65 year old male with no  previous cardiac history who presents with new onset symptoms of angina and  acute coronary syndrome.  He was admitted to the hospital on July 18, 2004 and ruled in for an acute non-Q wave myocardial infarction.  He was  taken promptly to the cardiac catheterization lab by Dr. Samule Ohm where  catheterization demonstrated critical three-vessel coronary artery disease  with normal left ventricular function.  A full consultation note has been  dictated previously.   OPERATIVE CONSENT:  The patient and his family have been counseled regarding  the indications, risks, and potential benefits of surgery.  Alternative  treatments strategies have been discussed.   They understand and accept all  associated risks and desire to proceed as described.   DESCRIPTION OF PROCEDURE:  The patient was brought to the operating room on  the above mentioned date and central monitoring was established by the  anesthesia service under the care and direction of W. Autumn Patty, MD.  Specifically, a Swann-Ganz catheter was placed through the right internal  jugular approach.  A radial arterial line was placed.  Intravenous  antibiotics were administered.  Following induction with general  endotracheal anesthesia, a Foley catheter was placed.  The patient's chest,  abdomen, both groins, and both lower extremities were prepped and draped in  sterile manner.  A median sternotomy incision was performed and the left  internal mammary artery was dissected from the chest wall and prepared for  bypass grafting.  The left internal mammary artery was good quality conduit.  Following this, the right internal mammary artery was dissected from the  chest wall and also prepared for bypass grafting.  This vessel was also good  quality conduit.  Simultaneously, saphenous vein was obtained form the  patient's right thigh using endoscopic vein harvest technique.  The vein is  slightly small caliber but otherwise acceptable quality conduit.  After the  saphenous vein was removed from the thigh, the small incision just above the  knee was closed in multiple layers with running absorbable suture.  The  patient was heparinized systemically.   The pericardium was opened.  The ascending aorta was normal in appearance.  The ascending aorta and right atrium were cannulated for cardiopulmonary  bypass.  Adequate heparinization was verified.  Cardiopulmonary bypass was  begun and the surface of the heart was inspected.  Distal sites were  selected for coronary artery bypass grafting.  Portions of saphenous vein  and both internal mammary arteries were trimmed to appropriate lengths.  A   temperature probe was placed in the left ventricular septum. A cardioplegia  catheter was placed in the ascending aorta.   The patient was cooled to 32 degrees systemic temperature.  The aortic cross-  clamp was applied and cardioplegia was delivered in an antegrade fashion  through the aortic root.  Iced saline slush was applied for topical  hypothermia.  The initial cardioplegic arrest and myocardial cooling were  thought to be excellent.  Repeat doses of cardioplegia were administered  intermittently throughout the cross-clamp portion of the operation both  through the aortic root and down the subsequently placed vein grafts to  maintain septal temperature below 15 degrees centigrade.  The following  distal coronary anastomoses were performed.   (1) The first circumflex marginal branch was grafted with a saphenous vein  graft in a side-to-side fashion.  This coronary measured 1.5 mm in diameter  and was of good quality at the site of distal bypass.  (2)  The second  circumflex marginal branch was grafted using a sequential saphenous vein  graft off of the vein placed in the first circumflex marginal branch.  This  vessel measured 1.7 mm in diameter and was of good quality at the site of  the distal bypass.  (3)  The third diagonal branch off the left anterior  descending coronary artery was grafted with a saphenous vein graft in an end-  to-side fashion. This coronary measured 1.5 mm in diameter and was of good  quality at the site of the distal bypass.  (4)  The distal right coronary  artery was grafted with the right internal mammary artery in end-to-side  fashion.  This coronary measured 1.7 mm in diameter and was of good quality  at the site of distal bypass.  (5)  The distal left anterior descending  coronary artery was grafted with the left internal mammary artery in end-to-  side fashion.  This coronary measured 1.5 mm in diameter and was of good quality at the site of distal  bypass.   Both proximal saphenous vein anastomoses were performed directly to the  ascending aorta prior to removal of the aortic cross-clamp.  The septal  temperature was noted to rise rapidly with reperfusion of the left internal  mammary artery.  The aortic cross-clamp was removed after a total cross-  clamp time of 87 minutes.   The heart began to beat spontaneously without need for cardioversion.  All  proximal and distal coronary anastomoses were inspected for hemostasis and  appropriate graft orientation epicardial pacing wires were fixed to the  right ventricular outflow tract and to the right atrial appendage.  The  patient was rewarmed to 37 degrees centigrade temperature.  The patient was  weaned from cardiopulmonary bypass without difficulty.  The patient's rhythm  at separation from bypass was normal sinus rhythm.  Atrial pacing was  employed to  increase the heart rate.  No inotropic support was required.  Total cardiopulmonary bypass time for the operation was 102 minutes.  The  venous and arterial cannula are removed uneventfully.  Protamine was  administered to reverse the anticoagulation.  The mediastinum and both left  and right pleural spaces were irrigated with saline solution containing  vancomycin.  Meticulous surgical hemostasis was ascertained.  The  mediastinum and both pleural spaces were drained with four chest tubes  placed through separate stab incisions inferiorly.  The median sternotomy  was closed with double strength sternal wires.  The soft tissues anterior to  the sternum were closed in multiple layers and the skin was closed with  running subcuticular skin closure.   The patient tolerated the procedure well and was transported to the surgical  intensive care unit in stable condition.  There were no intraoperative  complications.  All sponge, needle and instrument counts were verified  correct at completion of the operation.  No blood products were   administered.       CHO/MEDQ  D:  07/19/2004  T:  07/20/2004  Job:  [id number]   cc:   Salvadore Farber, M.D. [facility]  [address]  [address]  [place]  [place] [address]   Laurita Quint, M.D.  [address] [place]  [place] [address]  Fax: [phone]

## 2011-02-01 NOTE — Assessment & Plan Note (Signed)
[facility]                            CARDIOLOGY OFFICE NOTE   NAME:[name] [name] SLOWEY                      MRN:          [id number]  DATE:11/27/2006                            DOB:          1946/07/30    PRIMARY CARE PHYSICIAN:  Arta Silence, MD.   HISTORY OF PRESENT ILLNESS:  [name] [name] is a 65 year old gentleman who  suffered non ST elevation myocardial infarction in November 2005. He  underwent emergent coronary artery bypass grafting by Dr. Cornelius Moras. He has  done very well since then. LV function is normal. He continues to  exercise several days per week and work regularly. He is not having any  angina, exertional dyspnea, claudication, PND, orthopnea, edema, or  syncope.   CURRENT MEDICATIONS:  1. Enteric coated aspirin 81 mg daily.  2. Timolol eyedrops.  3. Lipitor 20 mg daily.  4. Metoprolol 100 mg twice daily.  5. Multivitamin.  6. Diovan HCT 160/25 one daily.  7. Norvasc 2.5 mg daily.  8. Spironolactone 25 mg daily.   PHYSICAL EXAMINATION:  GENERAL:  He is generally well-appearing in no  distress.  VITAL SIGNS:  Heart rate 86, blood pressure 124/62 and weight of 175  pounds.  NECK:  He has no jugular venous distention, thyromegaly or  lymphadenopathy.  LUNGS:  Clear to auscultation.  CARDIAC:  He has a nondisplaced point of maximal cardiac impulse. There  is a regular rate and rhythm without murmurs, rubs or gallops.  ABDOMEN:  Soft, nondistended, nontender. There is no hepatosplenomegaly.  Bowel sounds are normal.  EXTREMITIES:  Warm without clubbing, cyanosis, edema, or ulceration.   Electrocardiogram  demonstrates normal sinus rhythm with borderline left  atrial enlargement and is otherwise normal.   Lipid profile from July 04, 2006 demonstrates LDL 58, HDL 41.  Creatinine from March 3 of this year is 1.2.   IMPRESSION/RECOMMENDATIONS:  1. Coronary disease:  Asymptomatic now 2.5 years after coronary artery      bypass  graft and non ST elevation myocardial infarction. Continue      aspirin, beta blocker and ARB.  2. Hypertension:  Nicely controlled on current medicines. Managed by      Dr. Hetty Ely.  3. Hypercholesterolemia:  Excellent control under the guidance of Dr.      Hetty Ely. Goal LDL less than 70 which he is at.     [name] Farber, MD  Electronically Signed    WED/MedQ  DD: 11/27/2006  DT: 11/29/2006  Job #: [id number]   cc:   Arta Silence, MD

## 2011-02-01 NOTE — H&P (Signed)
NAME:  Alan [name], MALEK NO.:  [id number]   MEDICAL RECORD NO.:  [id number]          PATIENT TYPE:  INP   LOCATION:  1823                         FACILITY:  [facility]   PHYSICIAN:  Salvadore Farber, M.D. LHCDATE OF BIRTH:  Jul 05, 1946   DATE OF ADMISSION:  07/18/2004  DATE OF DISCHARGE:                                HISTORY & PHYSICAL   CHIEF COMPLAINT:  Chest pain.   HISTORY OF PRESENT ILLNESS:  Mr. Fluke is a 65 year old gentleman without  prior history of cardiovascular disease.  Risk factors are hypertension,  family history of premature atherosclerotic disease, and former tobacco  abuse.   Mr. Cid began having chest discomfort with minimal exertion on Sunday.  On  Sunday, he had discomfort with activity such as working in his yard carrying  a weed eater.  Symptoms have progressed to the discomfort with lifting  moderate weight objects today.  He presented to his primary care doctor's  office.  He was in turn referred to the emergency room.   En route to the emergency room, he had chest discomfort while driving.  This  discomfort resolved spontaneously in the ER.  He is currently pain-free.  He  denies any antecedent exertional dyspnea, PND, orthopnea, edema,  claudication, TIA, and stroke.   ALLERGIES:  No known drug allergies.   MEDICATIONS:  Norvasc, Atenolol, Timoptic.   PAST MEDICAL HISTORY:  1.  Hypertension.  2.  Glaucoma.  3.  Esophageal dilation in 2001.   SOCIAL HISTORY:  The patient lives in [place] with his wife of 31  years.  He is a Education officer, community at [employer].  He previously smoked but  quit in 2000 after 40-pack years.  Drinks approximately 24 beers per week.  Denies illicit drug use.   FAMILY HISTORY:  Mother died at 19 of gastrointestinal cancer.  Details  unavailable.  Father died in his 35s with stroke with COPD.  A sister died  at 50 of breast cancer.  One brother is alive at 15 after suffering  myocardial infarction  and undergoing CABG in his 32s.   REVIEW OF SYMPTOMS:  Remarkable only for numbness in his fingers which  occurs in the morning and is chronic.  Otherwise negative in detail except  as above.   PHYSICAL EXAMINATION:  GENERAL:  This is a well-appearing but anxious man in  no distress.  VITAL SIGNS:  Heart rate 107, blood pressure 150/86.  Oxygen saturation 98%  on two liters.  Temperature of 98.1.  NECK:  He has no jugular venous distention and no thyromegaly.  LUNGS:  Clear to auscultation.  CARDIOVASCULAR:  He has a nondisplaced point of maximal cardiac impulse.  He  has a regular rate and rhythm without murmur, rub, or gallop.  ABDOMEN:  Soft, nontender, and nondistended.  There is no  hepatosplenomegaly.  Bowel sounds are normal.  EXTREMITIES:  Warm without clubbing, cyanosis, edema, or ulceration.  Carotid pulses are 2+ bilaterally without bruits.  Femoral pulses are 2+  bilaterally without bruit.  NEUROLOGICAL:  Cranial nerves II through XII are grossly intact.  Strength  is intact in all four extremities.   LABORATORY DATA:  Electrocardiogram demonstrates sinus tachycardia at 111  beats per minute with deep ST depressions inferiorly and in leads V3 to V6.  There is ST elevation in leads AVR.  These electrocardiographic  abnormalities are new compared to the electrocardiogram performed in Dr.  Wynelle Bourgeois office earlier this afternoon.   Remarkable for a hematocrit of 46, platelets 207,000.  Remainder are  pending.   IMPRESSION:  1.  Acute coronary syndrome:  Electrocardiogram is suggestive of left main      or multivessel disease.  Though he is pain-free, electrocardiogram is      very worrisome for a large territory ischemia.  We will therefore      proceed to urgent cardiac catheterization.  We will add the aspirin,      heparin, statin, and continue beta blocker.  2.  Questionable dyslipidemia:  Check fasting lipid profile and check      statin.  3.  Heavy  alcohol use:  Add Ativan.       WED/MEDQ  D:  07/18/2004  T:  07/18/2004  Job:  [id number]   cc:   Laurita Quint, M.D.  [address] [place]  [place] [address]  Fax: [phone]

## 2011-03-04 ENCOUNTER — Encounter: Payer: Self-pay | Admitting: Cardiology

## 2011-04-23 ENCOUNTER — Other Ambulatory Visit: Payer: Self-pay | Admitting: *Deleted

## 2011-04-23 MED ORDER — ATORVASTATIN CALCIUM 80 MG PO TABS: 40.0000 mg | ORAL_TABLET | Freq: Every day | ORAL | Status: DC

## 2011-05-02 ENCOUNTER — Other Ambulatory Visit: Payer: Self-pay | Admitting: *Deleted

## 2011-05-02 MED ORDER — METOPROLOL TARTRATE 100 MG PO TABS: 100.0000 mg | ORAL_TABLET | Freq: Two times a day (BID) | ORAL | Status: DC

## 2011-05-03 ENCOUNTER — Other Ambulatory Visit: Payer: Self-pay | Admitting: Cardiology

## 2011-07-18 ENCOUNTER — Other Ambulatory Visit: Payer: Self-pay | Admitting: Family Medicine

## 2011-07-18 DIAGNOSIS — I2510 Atherosclerotic heart disease of native coronary artery without angina pectoris: Secondary | ICD-10-CM

## 2011-07-18 DIAGNOSIS — E785 Hyperlipidemia, unspecified: Secondary | ICD-10-CM

## 2011-07-18 DIAGNOSIS — R7309 Other abnormal glucose: Secondary | ICD-10-CM

## 2011-07-19 ENCOUNTER — Other Ambulatory Visit (INDEPENDENT_AMBULATORY_CARE_PROVIDER_SITE_OTHER): Payer: 59

## 2011-07-19 DIAGNOSIS — E785 Hyperlipidemia, unspecified: Secondary | ICD-10-CM

## 2011-07-19 DIAGNOSIS — I2510 Atherosclerotic heart disease of native coronary artery without angina pectoris: Secondary | ICD-10-CM

## 2011-07-19 DIAGNOSIS — R7309 Other abnormal glucose: Secondary | ICD-10-CM

## 2011-07-19 LAB — CBC WITH DIFFERENTIAL/PLATELET
Basophils Absolute: 0 10*3/uL (ref 0.0–0.1)
Basophils Relative: 0.5 % (ref 0.0–3.0)
Eosinophils Absolute: 0.3 10*3/uL (ref 0.0–0.7)
Lymphocytes Relative: 25.4 % (ref 12.0–46.0)
MCHC: 34.4 g/dL (ref 30.0–36.0)
MCV: 97.5 fl (ref 78.0–100.0)
Monocytes Absolute: 0.6 10*3/uL (ref 0.1–1.0)
Neutrophils Relative %: 61.6 % (ref 43.0–77.0)
Platelets: 194 10*3/uL (ref 150.0–400.0)
RBC: 4.37 Mil/uL (ref 4.22–5.81)

## 2011-07-19 LAB — HEPATIC FUNCTION PANEL
Albumin: 4.2 g/dL (ref 3.5–5.2)
Alkaline Phosphatase: 60 U/L (ref 39–117)
Bilirubin, Direct: 0.1 mg/dL (ref 0.0–0.3)
Total Protein: 7.6 g/dL (ref 6.0–8.3)

## 2011-07-19 LAB — LIPID PANEL
HDL: 61.5 mg/dL (ref >39.00)
Triglycerides: 207 mg/dL — ABNORMAL HIGH (ref 0.0–149.0)
VLDL: 41.4 mg/dL — ABNORMAL HIGH (ref 0.0–40.0)

## 2011-07-19 LAB — MICROALBUMIN / CREATININE URINE RATIO
Creatinine,U: 99.9 mg/dL
Microalb Creat Ratio: 0.5 mg/g (ref 0.0–30.0)

## 2011-07-19 LAB — TSH: TSH: 1.7 u[IU]/mL (ref 0.35–5.50)

## 2011-07-19 LAB — RENAL FUNCTION PANEL
CO2: 30 mEq/L (ref 19–32)
Chloride: 100 mEq/L (ref 96–112)
GFR: 57.34 mL/min — ABNORMAL LOW (ref >60.00)
Phosphorus: 2.9 mg/dL (ref 2.3–4.6)
Sodium: 137 mEq/L (ref 135–145)

## 2011-07-23 ENCOUNTER — Ambulatory Visit (INDEPENDENT_AMBULATORY_CARE_PROVIDER_SITE_OTHER): Payer: 59 | Admitting: Family Medicine

## 2011-07-23 ENCOUNTER — Encounter: Payer: Self-pay | Admitting: Family Medicine

## 2011-07-23 DIAGNOSIS — K219 Gastro-esophageal reflux disease without esophagitis: Secondary | ICD-10-CM

## 2011-07-23 DIAGNOSIS — I10 Essential (primary) hypertension: Secondary | ICD-10-CM

## 2011-07-23 DIAGNOSIS — R1319 Other dysphagia: Secondary | ICD-10-CM

## 2011-07-23 DIAGNOSIS — Z23 Encounter for immunization: Secondary | ICD-10-CM

## 2011-07-23 DIAGNOSIS — E785 Hyperlipidemia, unspecified: Secondary | ICD-10-CM

## 2011-07-23 DIAGNOSIS — I2510 Atherosclerotic heart disease of native coronary artery without angina pectoris: Secondary | ICD-10-CM

## 2011-07-23 DIAGNOSIS — L570 Actinic keratosis: Secondary | ICD-10-CM

## 2011-07-23 DIAGNOSIS — Z125 Encounter for screening for malignant neoplasm of prostate: Secondary | ICD-10-CM

## 2011-07-23 DIAGNOSIS — R7309 Other abnormal glucose: Secondary | ICD-10-CM

## 2011-07-23 DIAGNOSIS — Z823 Family history of stroke: Secondary | ICD-10-CM

## 2011-07-23 MED ORDER — SILDENAFIL CITRATE 100 MG PO TABS: 100.0000 mg | ORAL_TABLET | Freq: Every day | ORAL | Status: DC

## 2011-07-23 NOTE — Patient Instructions (Addendum)
Check with your insurance to see if they will cover the shingles shot. See Shirlee Limerick about your referral before your leave today. Take care.  Work on [employer] and weight.  Recheck labs in 1 year before a physical.   Glad to see you.

## 2011-07-23 NOTE — Progress Notes (Signed)
Elevated Cholesterol: Using medications without problems:yes Muscle aches: no Diet compliance: "I'm working on it." Exercise: yes  Prostate cancer screening.  Neg FH.  We talked about recent guideline changes.    Colon cancer screening.  Colonoscopy done 2008. Not due for re-screening.  No blood per rectum per patient.   Hypertension:    Using medication without problems or lightheadedness: yes Chest pain with exertion:no Edema:no Short of breath:no  GERD: with h/o gastritis. Controlled without sx currently.  Doing well. No abd pain, no nausea.   Glaucoma- followed by eye clinic.   Meds, vitals, and allergies reviewed.   PMH and SH reviewed  ROS: See HPI.  Otherwise negative.    GEN: nad, alert and oriented HEENT: mucous membranes moist NECK: supple w/o LA CV: rrr. PULM: ctab, no inc wob ABD: soft, +bs EXT: no edema SKIN: no acute rash but 6 AKs noted on hands SK noted on R arm

## 2011-07-25 ENCOUNTER — Encounter: Payer: Self-pay | Admitting: Family Medicine

## 2011-07-25 DIAGNOSIS — L570 Actinic keratosis: Secondary | ICD-10-CM | POA: Insufficient documentation

## 2011-07-25 NOTE — Assessment & Plan Note (Signed)
Controlled, no change in meds. Labs d/w pt.  

## 2011-07-25 NOTE — Assessment & Plan Note (Signed)
All 6 treated with 3 cycles of freeze/thaw with liq N2.  Verbal informed consent per patient.  Tolerated well, routine instructions given, f/u if lesions persist.

## 2011-07-25 NOTE — Assessment & Plan Note (Signed)
Not fully controlled, but no change in meds given the current dose. Labs d/w pt. Work on [employer] .

## 2011-07-25 NOTE — Assessment & Plan Note (Signed)
Improved after EGD dilation.

## 2011-07-25 NOTE — Assessment & Plan Note (Signed)
Controlled.  Doing well.

## 2011-07-25 NOTE — Assessment & Plan Note (Signed)
Chest pain free.  Given known vasc disease and FH CVA, refer for carotid eval.  No bruit today on exam.  Continue risk factor reduction/treamtent.

## 2011-07-25 NOTE — Assessment & Plan Note (Signed)
Mild, no new meds. Labs d/w pt.

## 2011-07-25 NOTE — Assessment & Plan Note (Signed)
PSA options were discussed along with recent recs.  No indication for psa at this point, since patient is low risk and there is no FH of prosate CA.  He declined testing of PSA.

## 2011-08-05 ENCOUNTER — Other Ambulatory Visit: Payer: Self-pay | Admitting: Cardiology

## 2011-08-05 DIAGNOSIS — I6529 Occlusion and stenosis of unspecified carotid artery: Secondary | ICD-10-CM

## 2011-08-06 ENCOUNTER — Encounter (INDEPENDENT_AMBULATORY_CARE_PROVIDER_SITE_OTHER): Payer: 59 | Admitting: *Deleted

## 2011-08-06 DIAGNOSIS — R0989 Other specified symptoms and signs involving the circulatory and respiratory systems: Secondary | ICD-10-CM

## 2011-08-06 DIAGNOSIS — I6529 Occlusion and stenosis of unspecified carotid artery: Secondary | ICD-10-CM

## 2011-09-24 ENCOUNTER — Encounter: Payer: Self-pay | Admitting: Family Medicine

## 2011-09-24 ENCOUNTER — Ambulatory Visit (INDEPENDENT_AMBULATORY_CARE_PROVIDER_SITE_OTHER): Payer: 59 | Admitting: Family Medicine

## 2011-09-24 VITALS — BP 124/70 | HR 84 | Temp 98.8°F | Wt 191.5 lb

## 2011-09-24 DIAGNOSIS — J40 Bronchitis, not specified as acute or chronic: Secondary | ICD-10-CM

## 2011-09-24 MED ORDER — GUAIFENESIN-CODEINE 100-10 MG/5ML PO SYRP: 5.0000 mL | ORAL_SOLUTION | Freq: Every evening | ORAL | Status: DC | PRN

## 2011-09-24 NOTE — Assessment & Plan Note (Signed)
Anticipate viral as early on. If not improving over next few days, call us for zpack (h/o CAD). Push fluids, rest, supportive care. cheratussin for cough at night. Continue mucinex.

## 2011-09-24 NOTE — Progress Notes (Signed)
  Subjective:    Patient ID: Alan [name], male    DOB: 04/16/1946, 66 y.o.   MRN: [id number]  HPI CC: cold sxs  Head cold, drainage for several days, then 5d ago started having worsening drainage, coughing, hacking.  + sinus headache.  Cough productive of greenish sputum.  Feels breathing fine.  Feeling some congestion in chest.  Stays with drainage, attributes to drainage.  ++ pndrainage.  Wakes up 3-4 times nightly feeling like needs to spit up mucous.  Sometimes does sometimes doesn't.  So far has tried OTC guaifenesin and mucinex.  Using nasal saline.  Drinking plenty of water.  No fevers/chills, ear pain, tooth pain, ST.  No abd pain, n/v, chest pain, SOB.  + wife sick recently.  No smokers at home.  H/o allergies.  No h/o asthma, COPD.  + CAD, s/p CABG 2005  Review of Systems Per HPI    Objective:   Physical Exam  Nursing note and vitals reviewed. Constitutional: He appears well-developed and well-nourished. No distress.  HENT:  Head: Normocephalic and atraumatic.  Right Ear: Hearing, tympanic membrane, external ear and ear canal normal.  Left Ear: Hearing, tympanic membrane, external ear and ear canal normal.  Nose: Nose normal. No mucosal edema or rhinorrhea. Right sinus exhibits no maxillary sinus tenderness and no frontal sinus tenderness. Left sinus exhibits no maxillary sinus tenderness and no frontal sinus tenderness.  Mouth/Throat: Uvula is midline, oropharynx is clear and moist and mucous membranes are normal. No oropharyngeal exudate, posterior oropharyngeal edema, posterior oropharyngeal erythema or tonsillar abscesses.  Eyes: Conjunctivae and EOM are normal. Pupils are equal, round, and reactive to light. No scleral icterus.  Neck: Normal range of motion. Neck supple.  Cardiovascular: Normal rate, regular rhythm, normal heart sounds and intact distal pulses.   No murmur heard. Pulmonary/Chest: Effort normal and breath sounds normal. No respiratory distress. He has  no wheezes. He has no rales.  Lymphadenopathy:    He has no cervical adenopathy.  Skin: Skin is warm and dry. No rash noted.       Assessment & Plan:

## 2011-09-24 NOTE — Patient Instructions (Addendum)
Sounds like you have a viral upper respiratory infection. Antibiotics are not needed for this.  Viral infections usually take 7-10 days to resolve.  The cough can last several weeks to go away. Use medication as prescribed: cheratussin for cough at night. Push fluids and plenty of rest. Continue mucinex with fluid. Please return if you are not improving as expected, or if you have high fevers (>101.5) or difficulty swallowing or worsening productive cough. Call clinic with questions.  Good to see you today.

## 2011-11-26 ENCOUNTER — Encounter: Payer: Self-pay | Admitting: Cardiology

## 2011-11-26 ENCOUNTER — Ambulatory Visit (INDEPENDENT_AMBULATORY_CARE_PROVIDER_SITE_OTHER): Payer: 59 | Admitting: Cardiology

## 2011-11-26 VITALS — BP 125/70 | HR 65 | Ht 68.0 in | Wt 192.0 lb

## 2011-11-26 DIAGNOSIS — I10 Essential (primary) hypertension: Secondary | ICD-10-CM

## 2011-11-26 DIAGNOSIS — I6529 Occlusion and stenosis of unspecified carotid artery: Secondary | ICD-10-CM

## 2011-11-26 DIAGNOSIS — E785 Hyperlipidemia, unspecified: Secondary | ICD-10-CM

## 2011-11-26 DIAGNOSIS — I2510 Atherosclerotic heart disease of native coronary artery without angina pectoris: Secondary | ICD-10-CM

## 2011-11-26 NOTE — Assessment & Plan Note (Signed)
The blood pressure is at target. No change in medications is indicated. We will continue with therapeutic lifestyle changes (TLC).  

## 2011-11-26 NOTE — Assessment & Plan Note (Signed)
The patient has no new sypmtoms.  No further cardiovascular testing is indicated.  We will continue with aggressive risk reduction and meds as listed.  

## 2011-11-26 NOTE — Progress Notes (Signed)
HPI The patient presents for yearly follow up.  Since I last saw him he has done well. The patient denies any new symptoms such as chest discomfort, neck or arm discomfort. There has been no new shortness of breath, PND or orthopnea. There have been no reported palpitations, presyncope or syncope.  He did have heal trouble last year that decreased his activity over the summer.  However, now he is back to exercising almost every day.    No Known Allergies  Current Outpatient Prescriptions  Medication Sig Dispense Refill  . amLODipine (NORVASC) 10 MG tablet Take 1 tablet (10 mg total) by mouth daily.  90 tablet  3  . aspirin 81 MG tablet Take 81 mg by mouth daily.        Marland Kitchen atorvastatin (LIPITOR) 80 MG tablet Take 0.5 tablets (40 mg total) by mouth daily.  90 tablet  3  . metoprolol (LOPRESSOR) 100 MG tablet Take 1 tablet (100 mg total) by mouth 2 (two) times daily.  180 tablet  3  . Multiple Vitamins-Minerals (BL MULTIPLE VITAMINS) TABS Take 1 tablet by mouth daily.        . sildenafil (VIAGRA) 100 MG tablet Take 1 tablet (100 mg total) by mouth daily. As needed  8 tablet  12  . spironolactone (ALDACTONE) 25 MG tablet Take 1 tablet (25 mg total) by mouth daily.  90 tablet  3  . timolol (BETIMOL) 0.5 % ophthalmic solution 1 drop as directed.        . valsartan-hydrochlorothiazide (DIOVAN-HCT) 160-25 MG per tablet Take 1 tablet by mouth daily.  30 tablet  11    Past Medical History  Diagnosis Date  . Hyperlipidemia 03/17/1999  . Hypertension 03/17/1999  . GERD (gastroesophageal reflux disease) 09/16/1996  . CAD (coronary artery disease) 07/17/2004  . Diverticulosis of colon 12/15/2005  . Glaucoma   . History of tobacco abuse     Quit 2000  . Esophageal stricture   . Gastritis   . Stress fracture of foot     right midfoot, per Dr. Thereasa Parkin 2012    Past Surgical History  Procedure Date  . Esophagogastroduodenoscopy 10/08/2000    Stricture distal esoph, repeat dilation done 2012  .  Esophagogastroduodenoscopy 10/08/2000    Stricture distal esoph  . Coronary artery bypass graft 07/27/2004    By Dr. Cornelius Moras with a LIMA to the LAD, RIMA to the distal right coronary artery, spahenous vein graft first diagonal, saphenous vein graft to the first circumflex marginal, sequential saphenous vein graft to the second  . Colonoscopy 12/30/2005    Divertics, mild, 10 yrs  . Colonoscopy 11/26/2006    Colitis biopsy neg    ROS:  As stated in the HPI and negative for all other systems.  PHYSICAL EXAM BP 125/70  Pulse 65  Ht 5\' 8"  (1.727 m)  Wt 192 lb (87.091 kg)  BMI 29.19 kg/m2 GENERAL:  Well appearing HEENT:  Pupils equal round and reactive, fundi not visualized, oral mucosa unremarkable NECK:  No jugular venous distention, waveform within normal limits, carotid upstroke brisk and symmetric, no bruits, no thyromegaly LYMPHATICS:  No cervical, inguinal adenopathy LUNGS:  Clear to auscultation bilaterally BACK:  No CVA tenderness CHEST:  Well healed sternotomy scar. HEART:  PMI not displaced or sustained,S1 and S2 within normal limits, no S3, no S4, no clicks, no rubs, no murmurs ABD:  Flat, positive bowel sounds normal in frequency in pitch, no bruits, no rebound, no guarding, no midline pulsatile mass,  no hepatomegaly, no splenomegaly EXT:  2 plus pulses throughout, no edema, no cyanosis no clubbing NEURO:  Cranial nerves II through XII grossly intact, motor grossly intact throughout PSYCH:  Cognitively intact, oriented to person place and time  EKG:  Sinus rhythm, rate 65, axis within normal limits, intervals within normal limits, no acute ST-T wave changes. 11/26/2011   ASSESSMENT AND PLAN

## 2011-11-26 NOTE — Assessment & Plan Note (Signed)
He has 0 - 39% right stenosis and 40 - 59% left.  He will have follow up in Nov.

## 2011-11-26 NOTE — Assessment & Plan Note (Signed)
His last LDL was 65.4 and HDL 61.5.  He will continue the meds as listed.

## 2011-11-26 NOTE — Patient Instructions (Signed)
The current medical regimen is effective;  continue present plan and medications.  Follow up in 1 year with Dr Hochrein.  You will receive a letter in the mail 2 months before you are due.  Please call us when you receive this letter to schedule your follow up appointment.  

## 2011-12-04 ENCOUNTER — Other Ambulatory Visit: Payer: Self-pay | Admitting: Family Medicine

## 2011-12-09 ENCOUNTER — Other Ambulatory Visit: Payer: Self-pay

## 2011-12-09 NOTE — Telephone Encounter (Signed)
Pt wanted to know if Spironolactone 25 mg was sent to [facility]. 12/04/11 refill request stated was sent to [facility] on 12/05/11. [facility] will [facility] with [facility] to see if has received and if not wll have [facility] contact our [facility].

## 2011-12-10 ENCOUNTER — Other Ambulatory Visit: Payer: Self-pay | Admitting: *Deleted

## 2011-12-10 MED ORDER — SPIRONOLACTONE 25 MG PO TABS: 25.0000 mg | ORAL_TABLET | Freq: Every day | ORAL | Status: DC

## 2011-12-10 NOTE — Telephone Encounter (Signed)
rx sent

## 2011-12-10 NOTE — Telephone Encounter (Signed)
Received faxed refill request from pharmacy for Spironolactone 25 mg. Medication was not on the current list, but shows in the history.  Added to medication list. Spoke to patient and was advised that he has been taking this for years. Is it okay to refill medication?

## 2012-01-02 ENCOUNTER — Other Ambulatory Visit: Payer: Self-pay | Admitting: *Deleted

## 2012-01-02 NOTE — Telephone Encounter (Signed)
This is a faxed refill request.

## 2012-01-06 ENCOUNTER — Other Ambulatory Visit: Payer: Self-pay | Admitting: *Deleted

## 2012-01-06 MED ORDER — AMLODIPINE BESYLATE 10 MG PO TABS: 10.0000 mg | ORAL_TABLET | Freq: Every day | ORAL | Status: DC

## 2012-01-06 NOTE — Telephone Encounter (Signed)
Received faxed refill request from pharmacy. Refill sent to pharmacy electronically. 

## 2012-03-12 ENCOUNTER — Other Ambulatory Visit: Payer: Self-pay | Admitting: Family Medicine

## 2012-03-13 NOTE — Telephone Encounter (Signed)
This medication is not on the patient's current meds list.  Please advise.

## 2012-03-15 NOTE — Telephone Encounter (Signed)
Has been on this medicine.  Sent.

## 2012-04-15 ENCOUNTER — Other Ambulatory Visit: Payer: Self-pay | Admitting: Family Medicine

## 2012-04-28 ENCOUNTER — Other Ambulatory Visit: Payer: Self-pay | Admitting: Cardiology

## 2012-07-19 ENCOUNTER — Other Ambulatory Visit: Payer: Self-pay | Admitting: Family Medicine

## 2012-07-19 DIAGNOSIS — E7800 Pure hypercholesterolemia, unspecified: Secondary | ICD-10-CM

## 2012-07-28 ENCOUNTER — Other Ambulatory Visit (INDEPENDENT_AMBULATORY_CARE_PROVIDER_SITE_OTHER): Payer: Medicare Other

## 2012-07-28 DIAGNOSIS — E7800 Pure hypercholesterolemia, unspecified: Secondary | ICD-10-CM

## 2012-07-28 LAB — LIPID PANEL
Cholesterol: 147 mg/dL (ref 0–200)
HDL: 47.5 mg/dL (ref >39.00)
Total CHOL/HDL Ratio: 3
Triglycerides: 254 mg/dL — ABNORMAL HIGH (ref 0.0–149.0)

## 2012-07-28 LAB — COMPREHENSIVE METABOLIC PANEL
CO2: 28 mEq/L (ref 19–32)
Creatinine, Ser: 1.2 mg/dL (ref 0.4–1.5)
GFR: 65.62 mL/min (ref >60.00)
Glucose, Bld: 103 mg/dL — ABNORMAL HIGH (ref 70–99)
Total Bilirubin: 0.6 mg/dL (ref 0.3–1.2)

## 2012-08-03 ENCOUNTER — Encounter: Payer: 59 | Admitting: Family Medicine

## 2012-08-05 ENCOUNTER — Ambulatory Visit (INDEPENDENT_AMBULATORY_CARE_PROVIDER_SITE_OTHER): Payer: Medicare Other | Admitting: Family Medicine

## 2012-08-05 ENCOUNTER — Encounter: Payer: Self-pay | Admitting: Family Medicine

## 2012-08-05 VITALS — BP 124/70 | HR 64 | Temp 97.8°F | Ht 67.25 in | Wt 183.0 lb

## 2012-08-05 DIAGNOSIS — I2510 Atherosclerotic heart disease of native coronary artery without angina pectoris: Secondary | ICD-10-CM

## 2012-08-05 DIAGNOSIS — Z125 Encounter for screening for malignant neoplasm of prostate: Secondary | ICD-10-CM

## 2012-08-05 DIAGNOSIS — E785 Hyperlipidemia, unspecified: Secondary | ICD-10-CM

## 2012-08-05 DIAGNOSIS — Z8619 Personal history of other infectious and parasitic diseases: Secondary | ICD-10-CM

## 2012-08-05 DIAGNOSIS — I6529 Occlusion and stenosis of unspecified carotid artery: Secondary | ICD-10-CM

## 2012-08-05 DIAGNOSIS — I10 Essential (primary) hypertension: Secondary | ICD-10-CM

## 2012-08-05 DIAGNOSIS — L989 Disorder of the skin and subcutaneous tissue, unspecified: Secondary | ICD-10-CM

## 2012-08-05 DIAGNOSIS — Z0000 Encounter for general adult medical examination without abnormal findings: Secondary | ICD-10-CM

## 2012-08-05 DIAGNOSIS — R7309 Other abnormal glucose: Secondary | ICD-10-CM

## 2012-08-05 DIAGNOSIS — L570 Actinic keratosis: Secondary | ICD-10-CM

## 2012-08-05 MED ORDER — SILDENAFIL CITRATE 100 MG PO TABS: 100.0000 mg | ORAL_TABLET | Freq: Every day | ORAL | Status: DC

## 2012-08-05 NOTE — Progress Notes (Signed)
I have personally reviewed the Medicare Annual Wellness questionnaire and have noted 1. The patient's medical and social history 2. Their use of alcohol, tobacco or illicit drugs 3. Their current medications and supplements 4. The patient's functional ability including ADL's, fall risks, home safety risks and hearing or visual             impairment. 5. Diet and physical activities 6. Evidence for depression or mood disorders  The patients weight, height, BMI have been recorded in the chart and visual acuity is per eye clinic.  I have made referrals, counseling and provided education to the patient based review of the above and I have provided the pt with a written personalized care plan for preventive services.  See scanned forms.  Routine anticipatory guidance given to patient.  See health maintenance. Tetanus2010 Flu yearly Shingles d/w pt.  VZV labs pending. He is unsure of VZV status PNA 2012 Colon 2008 Prostate cancer screening d/w pt. He elected to check.   Advance directive d/w pt.  Encouraged.  Wife would be designated.  He has talked with his wife about this.   Elevated Cholesterol: Using medications without problems:yes Muscle aches: only after exercise.  Diet compliance:yes Exercise:yes  Hypertension:    Using medication without problems or lightheadedness: yes Chest pain with exertion: no Edema:no Short of breath:no  H/o mild carotid disease and he'll call about f/u scan.   Runny nose, worse this fall.  Used to happened less, less severe when it did happen. Clear rhinorrhea, most often in the AM and late PM.  Using nasal saline.  No FCNAVD.    Skin lesions noted on arms, asking for eval   PMH and SH reviewed  Meds, vitals, and allergies reviewed.   ROS: See HPI.  Otherwise negative.    GEN: nad, alert and oriented HEENT: mucous membranes moist, tm wnl, nasal and OP exam wnl NECK: supple w/o LA CV: rrr. PULM: ctab, no inc wob ABD: soft, +bs EXT: no  edema SKIN: no acute rash 6 AKs on R forearm and hand, 2 AKs on L forearm  1 SK on R medial upper arm 1 hyperpigmented lesion that appears to be an irritated skin tag on the R inguinal area

## 2012-08-05 NOTE — Patient Instructions (Addendum)
Go to the lab on the way out.  We'll contact you with your lab report. Call about the scheduling the carotid study.  Get a 30 min visit for me to work on the spot on your R leg.  Keep exercising.   Take care.

## 2012-08-06 DIAGNOSIS — Z0000 Encounter for general adult medical examination without abnormal findings: Secondary | ICD-10-CM | POA: Insufficient documentation

## 2012-08-06 DIAGNOSIS — L989 Disorder of the skin and subcutaneous tissue, unspecified: Secondary | ICD-10-CM | POA: Insufficient documentation

## 2012-08-06 NOTE — Assessment & Plan Note (Signed)
Continue statin, encouraged to cut back on beer.  He understood.

## 2012-08-06 NOTE — Assessment & Plan Note (Signed)
See scanned forms. Routine anticipatory guidance given to patient. See health maintenance.  Tetanus2010  Flu yearly  Shingles d/w pt. VZV labs pending. He is unsure of VZV status  PNA 2012  Colon 2008  Prostate cancer screening d/w pt. He elected to check.  Advance directive d/w pt. Encouraged. Wife would be designated. He has talked with his wife about this.

## 2012-08-06 NOTE — Assessment & Plan Note (Signed)
D/w pt about diet and weight.  

## 2012-08-06 NOTE — Assessment & Plan Note (Signed)
Verbal consent- treated each lesion x3 with liq N2, tolerated well, no complications, f/u prn.  Routine instructions given .

## 2012-08-06 NOTE — Assessment & Plan Note (Signed)
Controlled Continue current meds 

## 2012-08-06 NOTE — Assessment & Plan Note (Signed)
He'll call about f/u.

## 2012-08-06 NOTE — Assessment & Plan Note (Signed)
Since it is hyperpigmented, we can do a punch bx on the lesion on the R inguinal area.  Return for that.  He agrees.

## 2012-08-06 NOTE — Assessment & Plan Note (Signed)
CP free.  Doing well.  D/w pt about continuing exercise.

## 2012-08-07 ENCOUNTER — Other Ambulatory Visit: Payer: Self-pay | Admitting: *Deleted

## 2012-08-18 ENCOUNTER — Telehealth: Payer: Self-pay

## 2012-08-18 NOTE — Telephone Encounter (Signed)
Pt giving Dr Para March advance notice; pt has spoken with [facility] [employer] about a drug assessment to lower the tier for losartan HCTZ 160-25 mg. With lower tier med will be less expensive. BC to fax form for completion by Dr Para March. Pt request call back when completed form faxed back to [facility].Please advise.

## 2012-08-19 ENCOUNTER — Other Ambulatory Visit: Payer: Self-pay | Admitting: Cardiology

## 2012-08-19 DIAGNOSIS — I6529 Occlusion and stenosis of unspecified carotid artery: Secondary | ICD-10-CM

## 2012-08-19 NOTE — Telephone Encounter (Signed)
[facility] faxed tier exception request form; in Dr Lianne Bushy in box.

## 2012-08-19 NOTE — Telephone Encounter (Signed)
Alan [name] with [facility] [phone] said Valsartan HCTZ has been approved tier one level which is $7.00 copay for 30 day rx from 08/18/12 thru 08/18/13. Approval letter to follow. Dr Para March does not need to complete tier exception form received this AM. Pt called to [facility] and answered questions.pt notified.

## 2012-08-19 NOTE — Telephone Encounter (Signed)
I'll address the hard copy on arrival.

## 2012-08-21 ENCOUNTER — Ambulatory Visit (INDEPENDENT_AMBULATORY_CARE_PROVIDER_SITE_OTHER): Payer: Medicare Other | Admitting: Family Medicine

## 2012-08-21 ENCOUNTER — Encounter: Payer: Self-pay | Admitting: Family Medicine

## 2012-08-21 VITALS — BP 126/68 | HR 71 | Temp 98.5°F | Wt 188.0 lb

## 2012-08-21 DIAGNOSIS — L989 Disorder of the skin and subcutaneous tissue, unspecified: Secondary | ICD-10-CM

## 2012-08-21 NOTE — Progress Notes (Signed)
Punch biopsy  Meds, vitals, and allergies reviewed.   Indication: suspicious lesion  Location: R groin  Size: ~1cm  Informed consent obtained.  Pt aware of risks not limited to but including infection, bleeding, damage to near by organs.  Prep: etoh/betadine  Anesthesia: 1%lidocaine with epi, good effect  Punch made with __3__mm punch  Minimal oozing, controlled with silver nitrate  Tolerated well  Routine postprocedure instructions d/w pt- keep area clean and bandaged, follow up if concerns/spreading erythema/pain.

## 2012-08-21 NOTE — Patient Instructions (Addendum)
Keep area clean and bandaged, and follow up if you have concerns (spreading redness or pain). We'll contact you with your lab report.

## 2012-08-23 NOTE — Assessment & Plan Note (Signed)
Punch biopsy to check for atypia.  This is likely a dark skin tag but is reasonable to exclude atypia with punch bx.  Rationale d/w pt.  If benign, and this is likely, we can shave off if needed in the future. Tolerated well.  No complications.

## 2012-08-24 ENCOUNTER — Other Ambulatory Visit: Payer: Self-pay | Admitting: *Deleted

## 2012-08-24 MED ORDER — AMLODIPINE BESYLATE 10 MG PO TABS: 10.0000 mg | ORAL_TABLET | Freq: Every day | ORAL | Status: DC

## 2012-08-27 ENCOUNTER — Encounter (INDEPENDENT_AMBULATORY_CARE_PROVIDER_SITE_OTHER): Payer: Medicare Other

## 2012-08-27 DIAGNOSIS — I6529 Occlusion and stenosis of unspecified carotid artery: Secondary | ICD-10-CM

## 2012-09-14 ENCOUNTER — Telehealth: Payer: Self-pay | Admitting: Family Medicine

## 2012-09-14 ENCOUNTER — Encounter: Payer: Self-pay | Admitting: Family Medicine

## 2012-09-14 ENCOUNTER — Ambulatory Visit (INDEPENDENT_AMBULATORY_CARE_PROVIDER_SITE_OTHER): Payer: Medicare Other | Admitting: Family Medicine

## 2012-09-14 VITALS — BP 122/72 | HR 92 | Temp 98.4°F | Wt 188.0 lb

## 2012-09-14 DIAGNOSIS — J069 Acute upper respiratory infection, unspecified: Secondary | ICD-10-CM

## 2012-09-14 MED ORDER — FLUTICASONE PROPIONATE 50 MCG/ACT NA SUSP: 2.0000 | Freq: Every day | NASAL | Status: DC

## 2012-09-14 MED ORDER — GUAIFENESIN-CODEINE 100-10 MG/5ML PO SYRP: 5.0000 mL | ORAL_SOLUTION | Freq: Every evening | ORAL | Status: DC | PRN

## 2012-09-14 NOTE — Patient Instructions (Addendum)
Use the cough medicine at night, sedation caution.  Use the nasal spray once each day.  Drink plenty of fluids, take tylenol as needed, and gargle with warm salt water for your throat if needed.  This should gradually improve.  Take care.  Let us know if you have other concerns.

## 2012-09-14 NOTE — Telephone Encounter (Signed)
Pt's wife called in and said pt has an apptmt tomorrow, but she is concerned b/c he is getting progressively worse. She says he's been sick since Friday, is not sleeping, and is on various heart meds, etc. She wants to know if there is any way you can work him into the schedule today. There is nothing open on your schedule, unless you approve [place] to open a slot. Can you accommodate him?

## 2012-09-14 NOTE — Progress Notes (Signed)
duration of symptoms: ~4-5 days.   Sleep disrupted from congestion.  Did some better sleeping up.  "I thought it was just a head cold."  Frequent rhinorrhea.   In last few days, tickle in throat, cough continues.  Sleep was worst last night.   Voice is altered.  Rhinorrhea: yes Congestion: yes ear pain: no sore throat: yes Cough: yes, some sputum this AM, clear.  Myalgias: mild, yes.  No fevers.   He tried OTC mucinex and allergy meds w/o sig amount of relief.    ROS: See HPI.  Otherwise negative.    Meds, vitals, and allergies reviewed.   GEN: nad, alert and oriented HEENT: mucous membranes moist, TM w/o erythema, nasal epithelium injected, OP with cobblestoning NECK: supple w/o LA, UAN noted CV: rrr. PULM: ctab, no inc wob ABD: soft, +bs EXT: no edema

## 2012-09-14 NOTE — Telephone Encounter (Signed)
If no one has an opening, then get him on at 12:30 with me.  Thanks.

## 2012-09-15 ENCOUNTER — Ambulatory Visit: Payer: Medicare Other | Admitting: Family Medicine

## 2012-09-15 DIAGNOSIS — J069 Acute upper respiratory infection, unspecified: Secondary | ICD-10-CM | POA: Insufficient documentation

## 2012-09-15 NOTE — Assessment & Plan Note (Signed)
Nontoxic, use codeine cough syrup with sedation caution and add on flonase for congestion. Likely viral. Ctab, not in distress.  F/u prn.  He agrees.  ddx d/w pt.

## 2012-09-17 ENCOUNTER — Other Ambulatory Visit: Payer: Self-pay | Admitting: *Deleted

## 2012-09-17 MED ORDER — ATORVASTATIN CALCIUM 80 MG PO TABS: 40.0000 mg | ORAL_TABLET | Freq: Every day | ORAL | Status: DC

## 2012-11-24 ENCOUNTER — Encounter: Payer: Self-pay | Admitting: Cardiology

## 2012-11-24 ENCOUNTER — Ambulatory Visit (INDEPENDENT_AMBULATORY_CARE_PROVIDER_SITE_OTHER): Payer: Medicare Other | Admitting: Cardiology

## 2012-11-24 VITALS — BP 128/76 | HR 80 | Ht 68.0 in | Wt 189.6 lb

## 2012-11-24 NOTE — Patient Instructions (Addendum)
The current medical regimen is effective;  continue present plan and medications.  Follow up in 1 year with Dr Hochrein.  You will receive a letter in the mail 2 months before you are due.  Please call us when you receive this letter to schedule your follow up appointment.  

## 2012-11-24 NOTE — Progress Notes (Signed)
HPI The patient presents for yearly follow up.  Since I last saw him he has done well. The patient denies any new symptoms such as chest discomfort, neck or arm discomfort. There has been no new shortness of breath, PND or orthopnea. There have been no reported palpitations, presyncope or syncope.  He exercises with tingling riding a bike 5 times per week. With this he has none of his previous symptoms. He said no new symptoms and stress testing in 2011.  No Known Allergies  Current Outpatient Prescriptions  Medication Sig Dispense Refill  . amLODipine (NORVASC) 10 MG tablet Take 1 tablet (10 mg total) by mouth daily.  90 tablet  2  . aspirin 81 MG tablet Take 81 mg by mouth daily.        Marland Kitchen atorvastatin (LIPITOR) 80 MG tablet Take 0.5 tablets (40 mg total) by mouth daily.  45 tablet  2  . metoprolol (LOPRESSOR) 100 MG tablet TAKE 1 TABLET TWICE A DAY  180 tablet  3  . Multiple Vitamins-Minerals (BL MULTIPLE VITAMINS) TABS Take 1 tablet by mouth daily.        . sildenafil (VIAGRA) 100 MG tablet Take 1 tablet (100 mg total) by mouth daily. As needed  8 tablet  12  . spironolactone (ALDACTONE) 25 MG tablet Take 1 tablet (25 mg total) by mouth daily.  90 tablet  3  . timolol (BETIMOL) 0.5 % ophthalmic solution 1 drop as directed.        . valsartan-hydrochlorothiazide (DIOVAN-HCT) 160-25 MG per tablet TAKE 1 TABLET DAILY  90 tablet  3   No current facility-administered medications for this visit.    Past Medical History  Diagnosis Date  . Hyperlipidemia 03/17/1999  . Hypertension 03/17/1999  . GERD (gastroesophageal reflux disease) 09/16/1996  . CAD (coronary artery disease) 07/17/2004  . Diverticulosis of colon 12/15/2005  . Glaucoma(365)   . History of tobacco abuse     Quit 2000  . Esophageal stricture   . Gastritis   . Stress fracture of foot     right midfoot, per Dr. Thereasa Parkin 2012    Past Surgical History  Procedure Laterality Date  . Esophagogastroduodenoscopy  10/08/2000   Stricture distal esoph, repeat dilation done 2012  . Esophagogastroduodenoscopy  10/08/2000    Stricture distal esoph  . 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 Cornelius Moras with a LIMA to the LAD, RIMA to the distal right coronary artery, spahenous vein graft first diagonal, saphenous vein graft to the first circumflex marginal, sequential saphenous vein graft to the second  . Colonoscopy  12/30/2005    Divertics, mild, 10 yrs  . Colonoscopy  11/26/2006    Colitis biopsy neg    ROS:  As stated in the HPI and negative for all other systems.  PHYSICAL EXAM BP 128/76  Pulse 80  Ht 5\' 8"  (1.727 m)  Wt 189 lb 9.6 oz (86.002 kg)  BMI 28.84 kg/m2 GENERAL:  Well appearing HEENT:  Pupils equal round and reactive, fundi not visualized, oral mucosa unremarkable NECK:  No jugular venous distention, waveform within normal limits, carotid upstroke brisk and symmetric, no bruits, no thyromegaly LYMPHATICS:  No cervical, inguinal adenopathy LUNGS:  Clear to auscultation bilaterally BACK:  No CVA tenderness CHEST:  Well healed sternotomy scar. HEART:  PMI not displaced or sustained,S1 and S2 within normal limits, no S3, no S4, no clicks, no rubs, no murmurs ABD:  Flat, positive bowel sounds normal in frequency in pitch,  no bruits, no rebound, no guarding, no midline pulsatile mass, no hepatomegaly, no splenomegaly EXT:  2 plus pulses throughout, no edema, no cyanosis no clubbing   EKG:  Sinus rhythm, rate 80 axis within normal limits, intervals within normal limits, no acute ST-T wave changes. 11/24/2012   ASSESSMENT AND PLAN  CAD:  The patient has no new sypmtoms.  No further cardiovascular testing is indicated.  We will continue with aggressive risk reduction and meds as listed.  HTN:  The blood pressure is at target. No change in medications is indicated. We will continue with therapeutic lifestyle changes (TLC).  CAROTID STENOSIS:  He has moderate stenosis was 0-39% right and 40-59%  left. He will have this repeated in December.

## 2012-11-27 ENCOUNTER — Other Ambulatory Visit: Payer: Self-pay | Admitting: *Deleted

## 2012-11-27 MED ORDER — SPIRONOLACTONE 25 MG PO TABS: 25.0000 mg | ORAL_TABLET | Freq: Every day | ORAL | Status: DC

## 2012-12-03 ENCOUNTER — Ambulatory Visit (INDEPENDENT_AMBULATORY_CARE_PROVIDER_SITE_OTHER): Payer: Medicare Other | Admitting: Family Medicine

## 2012-12-03 ENCOUNTER — Encounter: Payer: Self-pay | Admitting: Family Medicine

## 2012-12-03 ENCOUNTER — Telehealth: Payer: Self-pay | Admitting: Family Medicine

## 2012-12-03 VITALS — BP 120/62 | HR 78 | Temp 98.8°F | Wt 192.0 lb

## 2012-12-03 DIAGNOSIS — R059 Cough, unspecified: Secondary | ICD-10-CM | POA: Insufficient documentation

## 2012-12-03 DIAGNOSIS — R05 Cough: Secondary | ICD-10-CM

## 2012-12-03 MED ORDER — DOXYCYCLINE HYCLATE 100 MG PO TABS: 100.0000 mg | ORAL_TABLET | Freq: Two times a day (BID) | ORAL | Status: DC

## 2012-12-03 NOTE — Progress Notes (Signed)
Sx started about 1 week ago.  Lucila Maine was sick and wife is sick with similar.  Now with cough and drainage.  Last night/this AM the cough got worse.  No fevers.  Some sputum this AM, discolored this AM, usually was clear.  No ear pain.  No facial pain.  Mild ST until yesterday.  Voice is altered due to cough.  No wheeze.  Not SOB.  He has come cheratussin left over from prev.  He hadn't taken it until recently (ie 4AM today).    Wife was treated for sinus infection with abx and cough medicine.    Meds, vitals, and allergies reviewed.   ROS: See HPI.  Otherwise, noncontributory.  Well appearing.  ncat Tm wnl Nasal exam with minimal irritation OP wnl Neck supple Ctab, no wheeze rrr Ext well perfused

## 2012-12-03 NOTE — Telephone Encounter (Signed)
Patient Information:  Caller Name: Kendal Hymen  Phone: [phone]  Patient: Alan [name], Alan [name]  Gender: Male  DOB: 1946-04-19  Age: 67 Years  PCP: Crawford Givens Clelia Croft) [facility])  Office Follow Up:  Does the office need to follow up with this patient?: Yes  Instructions For The Office: No appts. available in Epic Electronic Health Record.  Please return call to Patient/Spouse  at [phone] regarding appt. need for 12/03/12.  RN Note:  Spouse states patient developed cough, congestion 11/26/12. States she can hear a "rattling" in patient's chest. Deniss wheezing at present. Patient is expectorating green sputum. Spouse requests that patient be seen in office 12/03/12. Care advice given per guidelines. Call back parameters reviewed. Spouse verbalizes understanding. No appts. available in Epic Electronic Health Record.  Please return call to Patient/Spouse  at [phone] regarding appt. need for 12/03/12.  Symptoms  Reason For Call & Symptoms: Cough, chest congestion, nasal congestion  Reviewed Health History In EMR: Yes  Reviewed Medications In EMR: Yes  Reviewed Allergies In EMR: Yes  Reviewed Surgeries / Procedures: Yes  Date of Onset of Symptoms: 11/26/2012  Treatments Tried: Mucinex, Cheratussin AC  Treatments Tried Worked: No  Guideline(s) Used:  Cough  Disposition Per Guideline:   See Today or Tomorrow in Office  Reason For Disposition Reached:   Continuous (nonstop) coughing interferes with work or school and no improvement using cough treatment per Care Advice  Advice Given:  Reassurance  Coughing is the way that our lungs remove irritants and mucus. It helps protect our lungs from getting pneumonia.  Coughing Spasms:  Drink warm fluids. Inhale warm mist (Reason: both relax the airway and loosen up the phlegm).  Avoid Tobacco Smoke:  Smoking or being exposed to smoke makes coughs much worse.  Call Back If:  Difficulty breathing  You become  worse.  Patient Will Follow Care Advice:  YES

## 2012-12-03 NOTE — Patient Instructions (Signed)
Use the cough syrup for a few days, get some rest and drink plenty of fluids.   If not better in a few days and the productive cough continues, then start the antibiotics.   Take care.

## 2012-12-03 NOTE — Telephone Encounter (Signed)
Please offer to add him on at 3:45 today.

## 2012-12-03 NOTE — Telephone Encounter (Signed)
Wife advised.  Appt scheduled. 

## 2012-12-03 NOTE — Telephone Encounter (Signed)
Please see note below. 

## 2012-12-03 NOTE — Assessment & Plan Note (Signed)
Nontoxic, ctab.  Would hold doxy for now, use cheratussin at home and fill abx if productive cough persists.  He agrees.  F/u prn.  Rest and fluids o/w.

## 2012-12-04 ENCOUNTER — Ambulatory Visit: Payer: Medicare Other | Admitting: Family Medicine

## 2012-12-14 ENCOUNTER — Other Ambulatory Visit: Payer: Self-pay | Admitting: *Deleted

## 2012-12-14 MED ORDER — METOPROLOL TARTRATE 100 MG PO TABS: ORAL_TABLET | ORAL | Status: DC

## 2013-01-19 ENCOUNTER — Other Ambulatory Visit: Payer: Self-pay | Admitting: *Deleted

## 2013-01-19 MED ORDER — VALSARTAN-HYDROCHLOROTHIAZIDE 160-25 MG PO TABS: ORAL_TABLET | ORAL | Status: DC

## 2013-03-02 NOTE — Telephone Encounter (Signed)
Enc opened in error

## 2013-04-01 ENCOUNTER — Telehealth: Payer: Self-pay

## 2013-04-01 MED ORDER — ZOSTER VACCINE LIVE 19400 UNT/0.65ML ~~LOC~~ SOLR: 0.6500 mL | Freq: Once | SUBCUTANEOUS | Status: DC

## 2013-04-01 NOTE — Telephone Encounter (Signed)
Sent. Thanks.   

## 2013-04-01 NOTE — Telephone Encounter (Signed)
Pt request shingles vaccination sent to [facility]; pt's insurance will pay if given at pharmacy. Pt request cb when sent to pharmacy.

## 2013-04-12 ENCOUNTER — Other Ambulatory Visit: Payer: Self-pay | Admitting: Family Medicine

## 2013-04-12 NOTE — Telephone Encounter (Signed)
Electronic refill request. Medication is not on the patient's current meds list.  Please advise.

## 2013-06-11 ENCOUNTER — Ambulatory Visit (INDEPENDENT_AMBULATORY_CARE_PROVIDER_SITE_OTHER): Payer: Medicare Other

## 2013-06-11 DIAGNOSIS — Z23 Encounter for immunization: Secondary | ICD-10-CM

## 2013-06-21 ENCOUNTER — Other Ambulatory Visit: Payer: Self-pay | Admitting: *Deleted

## 2013-06-21 MED ORDER — ATORVASTATIN CALCIUM 80 MG PO TABS: 40.0000 mg | ORAL_TABLET | Freq: Every day | ORAL | Status: DC

## 2013-06-21 MED ORDER — AMLODIPINE BESYLATE 10 MG PO TABS: 10.0000 mg | ORAL_TABLET | Freq: Every day | ORAL | Status: DC

## 2013-07-20 ENCOUNTER — Other Ambulatory Visit: Payer: Self-pay | Admitting: Family Medicine

## 2013-07-20 DIAGNOSIS — E7800 Pure hypercholesterolemia, unspecified: Secondary | ICD-10-CM

## 2013-07-30 ENCOUNTER — Other Ambulatory Visit (INDEPENDENT_AMBULATORY_CARE_PROVIDER_SITE_OTHER): Payer: Medicare Other

## 2013-07-30 DIAGNOSIS — E7800 Pure hypercholesterolemia, unspecified: Secondary | ICD-10-CM

## 2013-07-30 LAB — COMPREHENSIVE METABOLIC PANEL
ALT: 18 U/L (ref 0–53)
AST: 17 U/L (ref 0–37)
Alkaline Phosphatase: 57 U/L (ref 39–117)
CO2: 31 mEq/L (ref 19–32)
Calcium: 9.9 mg/dL (ref 8.4–10.5)
Chloride: 98 mEq/L (ref 96–112)
Creatinine, Ser: 1.2 mg/dL (ref 0.4–1.5)
GFR: 65.42 mL/min (ref >60.00)
Sodium: 135 mEq/L (ref 135–145)
Total Protein: 7.5 g/dL (ref 6.0–8.3)

## 2013-07-30 LAB — LIPID PANEL
HDL: 53.7 mg/dL (ref >39.00)
LDL Cholesterol: 51 mg/dL (ref 0–99)
Total CHOL/HDL Ratio: 3
Triglycerides: 187 mg/dL — ABNORMAL HIGH (ref 0.0–149.0)
VLDL: 37.4 mg/dL (ref 0.0–40.0)

## 2013-08-06 ENCOUNTER — Encounter: Payer: Self-pay | Admitting: Family Medicine

## 2013-08-06 ENCOUNTER — Ambulatory Visit (INDEPENDENT_AMBULATORY_CARE_PROVIDER_SITE_OTHER): Payer: Medicare Other | Admitting: Family Medicine

## 2013-08-06 VITALS — BP 122/70 | HR 68 | Temp 98.4°F | Ht 68.0 in | Wt 193.2 lb

## 2013-08-06 DIAGNOSIS — E785 Hyperlipidemia, unspecified: Secondary | ICD-10-CM

## 2013-08-06 DIAGNOSIS — I6529 Occlusion and stenosis of unspecified carotid artery: Secondary | ICD-10-CM

## 2013-08-06 DIAGNOSIS — I10 Essential (primary) hypertension: Secondary | ICD-10-CM

## 2013-08-06 DIAGNOSIS — Z0000 Encounter for general adult medical examination without abnormal findings: Secondary | ICD-10-CM

## 2013-08-06 DIAGNOSIS — L989 Disorder of the skin and subcutaneous tissue, unspecified: Secondary | ICD-10-CM

## 2013-08-06 DIAGNOSIS — R7309 Other abnormal glucose: Secondary | ICD-10-CM

## 2013-08-06 DIAGNOSIS — N529 Male erectile dysfunction, unspecified: Secondary | ICD-10-CM

## 2013-08-06 MED ORDER — SILDENAFIL CITRATE 100 MG PO TABS: 100.0000 mg | ORAL_TABLET | Freq: Every day | ORAL | Status: DC

## 2013-08-06 NOTE — Progress Notes (Signed)
Pre-visit discussion using our clinic review tool. No additional management support is needed unless otherwise documented below in the visit note.  I have personally reviewed the Medicare Annual Wellness questionnaire and have noted 1. The patient's medical and social history 2. Their use of alcohol, tobacco or illicit drugs 3. Their current medications and supplements 4. The patient's functional ability including ADL's, fall risks, home safety risks and hearing or visual             impairment. 5. Diet and physical activities 6. Evidence for depression or mood disorders  The patients weight, height, BMI have been recorded in the chart and visual acuity is per eye clinic.  I have made referrals, counseling and provided education to the patient based review of the above and I have provided the pt with a written personalized care plan for preventive services.  See scanned forms.  Routine anticipatory guidance given to patient.  See health maintenance. Flu 2014 Shingles 2014 PNA 2013 Tetanus 2010 Colonoscopy 2008 Prostate cancer screening and PSA options (with potential risks and benefits of testing vs not testing) were discussed along with recent recs/guidelines.  He declined testing PSA at this point. Advance directive d/w pt.  Wife designated if incapacitated.  Cognitive function addressed- see scanned forms- and if abnormal then additional documentation follows.   Carotid artery stenosis: no focal neuro sx, due for f/u.  See below.   Elevated sugar- more candy recently.  Labs, exercise and diet d/w pt.  He understood goals, ie lower sugar, lower risk.   Hypertension:    Using medication without problems or lightheadedness: yes Chest pain with exertion:no (he does have occ positional L upper shoulder pain with ROM, going on intermittently for the last 3 years; has has no sx with high cardio exercise like biking) Edema:no Short of breath:no  Elevated Cholesterol: Using medications  without problems:yes Muscle aches: no Diet compliance:see above Exercise:see above  ED-viagra works, no ADE. doin well on med.  Needs refill.   B rash noted on the forehead. Not tender.  Not itchy.  Not self-resolving.   PMH and SH reviewed  Meds, vitals, and allergies reviewed.   ROS: See HPI.  Otherwise negative.    GEN: nad, alert and oriented HEENT: mucous membranes moist NECK: supple w/o LA CV: rrr. PULM: ctab, no inc wob ABD: soft, +bs EXT: no edema SKIN: B flaky red rash on the forehead, likely AKs.  Also AKs noted on the dorsum of the hands B L shoulder w/o impingement, no deficit on ROM

## 2013-08-06 NOTE — Patient Instructions (Addendum)
I'll work on getting your carotid study scheduled.  Alan [name] will call about your referral (dermatology). Don't change your meds otherwise.  Keep exercising.  Cut back on candy/soda/beer.

## 2013-08-08 ENCOUNTER — Encounter: Payer: Self-pay | Admitting: Family Medicine

## 2013-08-08 NOTE — Assessment & Plan Note (Signed)
Repeat scan to be arranged.

## 2013-08-08 NOTE — Assessment & Plan Note (Signed)
Controlled, continue current meds.   

## 2013-08-08 NOTE — Assessment & Plan Note (Signed)
Likely AKs, refer to derm. He agrees.

## 2013-08-08 NOTE — Assessment & Plan Note (Signed)
Diet/exercise d/w pt.  Labs d/w pt.  He'll work on all of it.

## 2013-08-08 NOTE — Assessment & Plan Note (Signed)
See scanned forms.  Routine anticipatory guidance given to patient.  See health maintenance. Flu 2014 Shingles 2014 PNA 2013 Tetanus 2010 Colonoscopy 2008 Prostate cancer screening and PSA options (with potential risks and benefits of testing vs not testing) were discussed along with recent recs/guidelines.  He declined testing PSA at this point. Advance directive d/w pt.  Wife designated if incapacitated.  Cognitive function addressed- see scanned forms- and if abnormal then additional documentation follows.

## 2013-08-08 NOTE — Assessment & Plan Note (Signed)
Controlled except for TGs, continue current meds and he'll work on diet.

## 2013-08-10 ENCOUNTER — Other Ambulatory Visit: Payer: Self-pay | Admitting: *Deleted

## 2013-08-10 MED ORDER — VALSARTAN-HYDROCHLOROTHIAZIDE 160-25 MG PO TABS: ORAL_TABLET | ORAL | Status: DC

## 2013-08-18 ENCOUNTER — Encounter (HOSPITAL_COMMUNITY): Payer: Medicare Other

## 2013-08-24 ENCOUNTER — Encounter (INDEPENDENT_AMBULATORY_CARE_PROVIDER_SITE_OTHER): Payer: Self-pay

## 2013-08-24 ENCOUNTER — Encounter: Payer: Self-pay | Admitting: Cardiology

## 2013-08-24 ENCOUNTER — Ambulatory Visit (HOSPITAL_COMMUNITY): Payer: Medicare Other

## 2013-08-26 ENCOUNTER — Encounter (INDEPENDENT_AMBULATORY_CARE_PROVIDER_SITE_OTHER): Payer: Medicare Other

## 2013-08-26 DIAGNOSIS — I6529 Occlusion and stenosis of unspecified carotid artery: Secondary | ICD-10-CM

## 2013-09-06 ENCOUNTER — Encounter: Payer: Self-pay | Admitting: Podiatry

## 2013-09-06 ENCOUNTER — Ambulatory Visit (INDEPENDENT_AMBULATORY_CARE_PROVIDER_SITE_OTHER): Payer: Medicare Other | Admitting: Podiatry

## 2013-09-06 VITALS — BP 137/71 | HR 70 | Resp 16

## 2013-09-06 DIAGNOSIS — M109 Gout, unspecified: Secondary | ICD-10-CM

## 2013-09-06 DIAGNOSIS — M779 Enthesopathy, unspecified: Secondary | ICD-10-CM

## 2013-09-06 MED ORDER — TRIAMCINOLONE ACETONIDE 10 MG/ML IJ SUSP
10.0000 mg | Freq: Once | INTRAMUSCULAR | Status: AC
  Administered 2013-09-06: 10 mg

## 2013-09-06 NOTE — Progress Notes (Signed)
Subjective:     Patient ID: Alan [name], male   DOB: [birth year], 67 y.o.   MRN: [id number]  HPI patient presents stating I'm having pain in my big toe joint right for the last 10 days. First-time he has had issue with this in approximately 9 months   Review of Systems     Objective:   Physical Exam Neurovascular status intact with no health history changes noted and inflammation and redness around the first MPJ right medial side    Assessment:     Probable recurrence of gout attack versus inflammatory capsulitis right first MPJ    Plan:     Reviewed moderation and did inject around the joint 3 mg Kenalog 5 mg Xylocaine Marcaine mixture and advised if it attack should occur were him to reappoint immediately and he may ultimately need to be put on medication

## 2013-09-06 NOTE — Patient Instructions (Signed)
Gout Gout is an inflammatory arthritis caused by a buildup of uric acid crystals in the joints. Uric acid is a chemical that is normally present in the blood. When the level of uric acid in the blood is too high it can form crystals that deposit in your joints and tissues. This causes joint redness, soreness, and swelling (inflammation). Repeat attacks are common. Over time, uric acid crystals can form into masses (tophi) near a joint, destroying bone and causing disfigurement. Gout is treatable and often preventable. CAUSES  The disease begins with elevated levels of uric acid in the blood. Uric acid is produced by your body when it breaks down a naturally found substance called purines. Certain foods you eat, such as meats and fish, contain high amounts of purines. Causes of an elevated uric acid level include:  Being passed down from parent to child (heredity).  Diseases that cause increased uric acid production (such as obesity, psoriasis, and certain cancers).  Excessive alcohol use.  Diet, especially diets rich in meat and seafood.  Medicines, including certain cancer-fighting medicines (chemotherapy), water pills (diuretics), and aspirin.  Chronic kidney disease. The kidneys are no longer able to remove uric acid well.  Problems with metabolism. Conditions strongly associated with gout include:  Obesity.  High blood pressure.  High cholesterol.  Diabetes. Not everyone with elevated uric acid levels gets gout. It is not understood why some people get gout and others do not. Surgery, joint injury, and eating too much of certain foods are some of the factors that can lead to gout attacks. SYMPTOMS   An attack of gout comes on quickly. It causes intense pain with redness, swelling, and warmth in a joint.  Fever can occur.  Often, only one joint is involved. Certain joints are more commonly involved:  Base of the big toe.  Knee.  Ankle.  Wrist.  Finger. Without  treatment, an attack usually goes away in a few days to weeks. Between attacks, you usually will not have symptoms, which is different from many other forms of arthritis. DIAGNOSIS  Your caregiver will suspect gout based on your symptoms and exam. In some cases, tests may be recommended. The tests may include:  Blood tests.  Urine tests.  X-rays.  Joint fluid exam. This exam requires a needle to remove fluid from the joint (arthrocentesis). Using a microscope, gout is confirmed when uric acid crystals are seen in the joint fluid. TREATMENT  There are two phases to gout treatment: treating the sudden onset (acute) attack and preventing attacks (prophylaxis).  Treatment of an Acute Attack.  Medicines are used. These include anti-inflammatory medicines or steroid medicines.  An injection of steroid medicine into the affected joint is sometimes necessary.  The painful joint is rested. Movement can worsen the arthritis.  You may use warm or cold treatments on painful joints, depending which works best for you.  Treatment to Prevent Attacks.  If you suffer from frequent gout attacks, your caregiver may advise preventive medicine. These medicines are started after the acute attack subsides. These medicines either help your kidneys eliminate uric acid from your body or decrease your uric acid production. You may need to stay on these medicines for a very long time.  The early phase of treatment with preventive medicine can be associated with an increase in acute gout attacks. For this reason, during the first few months of treatment, your caregiver may also advise you to take medicines usually used for acute gout treatment. Be sure you   understand your caregiver's directions. Your caregiver may make several adjustments to your medicine dose before these medicines are effective.  Discuss dietary treatment with your caregiver or dietitian. Alcohol and drinks high in sugar and fructose and foods  such as meat, poultry, and seafood can increase uric acid levels. Your caregiver or dietician can advise you on drinks and foods that should be limited. HOME CARE INSTRUCTIONS   Do not take aspirin to relieve pain. This raises uric acid levels.  Only take over-the-counter or prescription medicines for pain, discomfort, or fever as directed by your caregiver.  Rest the joint as much as possible. When in bed, keep sheets and blankets off painful areas.  Keep the affected joint raised (elevated).  Apply warm or cold treatments to painful joints. Use of warm or cold treatments depends on which works best for you.  Use crutches if the painful joint is in your leg.  Drink enough fluids to keep your urine clear or pale yellow. This helps your body get rid of uric acid. Limit alcohol, sugary drinks, and fructose drinks.  Follow your dietary instructions. Pay careful attention to the amount of protein you eat. Your daily diet should emphasize fruits, vegetables, whole grains, and fat-free or low-fat milk products. Discuss the use of coffee, vitamin C, and cherries with your caregiver or dietician. These may be helpful in lowering uric acid levels.  Maintain a healthy body weight. SEEK MEDICAL CARE IF:   You develop diarrhea, vomiting, or any side effects from medicines.  You do not feel better in 24 hours, or you are getting worse. SEEK IMMEDIATE MEDICAL CARE IF:   Your joint becomes suddenly more tender, and you have chills or a fever. MAKE SURE YOU:   Understand these instructions.  Will watch your condition.  Will get help right away if you are not doing well or get worse. Document Released: 08/30/2000 Document Revised: 12/28/2012 Document Reviewed: 04/15/2012 [facility] Patient Information 2014 [place], [place].    Information for patients with Gout  Gout defined-Gout occurs when urate crystals accumulate in your joint causing the inflammation and intense pain of gout attack.  Urate  crystals can form when you have high levels of uric acid in your blood.  Your body produces uric acid when it breaks down prurines-substances that are found naturally in your body, as well as in certain foods such as organ meats, anchioves, herring, asparagus, and mushrooms.  Normally uric acid dissolves in your blood and passes through your kidneys into your urine.  But sometimes your body either produces too much uric acid or your kidneys excrete too little uric acid.  When this happens, uric acid can build up, forming sharp needle-like urate crystals in a joint or surrounding tissue that cause pain, inflammation and swelling.    Gout is characterized by sudden, severe attacks of pain, redness and tenderness in joints, often the joint at the base of the big toe.  Gout is complex form of arthritis that can affect anyone.  Men are more likely to get gout but women become increasingly more susceptible to gout after menopause.  An acute attack of gout can wake you up in the middle of the night with the sensation that your big toe is on fire.  The affected joint is hot, swollen and so tender that even the weight or the sheet on it may seem intolerable.  If you experience symptoms of an acute gout attack it is important to your doctor as soon as the  symptoms start.  Gout that goes untreated can lead to worsening pain and joint damage.  Risk Factors:  You are more likely to develop gout if you have high levels of uric acid in your body.    Factors that increase the uric acid level in your body include:  Lifestyle factors.  Excessive alcohol use-generally more than two drinks a day for men and more than one for women increase the risk of gout.  Medical conditions.  Certain conditions make it more likely that you will develop gout.  These include hypertension, and chronic conditions such as diabetes, high levels of fat and cholesterol in the blood, and narrowing of the arteries.  Certain medications.   The uses of Thiazide diuretics- commonly used to treat hypertension and low dose aspirin can also increase uric acid levels.  Family history of gout.  If other members of your family have had gout, you are more likely to develop the disease.  Age and sex. Gout occurs more often in men than it does in women, primarily because women tend to have lower uric acid levels than men do.  Men are more likely to develop gout earlier usually between the ages of 4-50- whereas women generally develop signs and symptoms after menopause.    Tests and diagnosis:  Tests to help diagnose gout may include:  Blood test.  Your doctor may recommend a blood test to measure the uric acid level in your blood .  Blood tests can be misleading, though.  Some people have high uric acid levels but never experience gout.  And some people have signs and symptoms of gout, but don't have unusual levels of uric acid in their blood.  Joint fluid test.  Your doctor may use a needle to draw fluid from your affected joint.  When examined under the microscope, your joint fluid may reveal urate crystals.  Treatment:  Treatment for gout usually involves medications.  What medications you and your doctor choose will be based on your current health and other medications you currently take.  Gout medications can be used to treat acute gout attacks and prevent future attacks as well as reduce your risk of complications from gout such as the development of tophi from urate crystal deposits.  Alternative medicine:   Certain foods have been studied for their potential to lower uric acid levels, including:  Coffee.  Studies have found an association between coffee drinking (regular and decaf) and lower uric acid levels.  The evidence is not enough to encourage non-coffee drinkers to start, but it may give clues to new ways of treating gout in the future.  Vitamin C.  Supplements containing vitamin C may reduce the levels of uric acid in  your blood.  However, vitamin as a treatment for gout. Don't assume that if a little vitamin C is good, than lots is better.  Megadoses of vitamin C may increase your bodies uric acid levels.  Cherries.  Cherries have been associated with lower levels of uric acid in studies, but it isn't clear if they have any effect on gout signs and symptoms.  Eating more cherries and other dar-colored fruits, such as blackberries, blueberries, purple grapes and raspberries, may be a safe way to support your gout treatment.    Lifestyle/Diet Recommendations:   Drink 8 to 16 cups ( about 2 to 4 liters) of fluid each day, with at least half being water.  Avoid alcohol  Eat a moderate amount of protein, preferably from healthy  sources, such as low-fat or fat-free dairy, tofu, eggs, and nut butters.  Limit you daily intake of meat, fish, and poultry to 4 to 6 ounces.  Avoid high fat meats and desserts.  Decrease you intake of shellfish, beef, lamb, pork, eggs and cheese.  Choose a good source of vitamin C daily such as citrus fruits, strawberries, broccoli,  brussel sprouts, papaya, and cantaloupe.   Choose a good source of vitamin A every other day such as yellow fruits, or dark green/yellow vegetables.  Avoid drastic weigh reduction or fasting.  If weigh loss is desired lose it over a period of several months.  See "dietary considerations.." chart for specific food recommendations.  Dietary Considerations for people with Gout  Food with negligible purine content (0-15 mg of purine nitrogen per 100 grams food)  May use as desired except on calorie variations  Non fat milk Cocoa Cereals (except in list II) Hard candies  Buttermilk Carbonated drinks Vegetables (except in list II) Sherbet  Coffee Fruits Sugar Honey  Tea Cottage Cheese Gelatin-jell-o Salt  Fruit juice Breads Angel food Cake   Herbs/spices Jams/Jellies [employer]    Foods that do not contain excessive purine content,  but must be limited due to fat content  Cream Eggs Oil and Salad Dressing  Half and Half Peanut Butter Chocolate  Whole Milk Cakes Potato Chips  Butter Ice Cream Fried Foods  Cheese Nuts Waffles, pancakes   List II: Food with moderate purine content (50-150 mg of purine nitrogen per 100 grams of food)  Limit total amount each day to 5 oz. cooked Lean meat, other than those on list III   Poultry, other than those on list III Fish, other than those on list III   Seafood, other than those on list III  These foods may be used occasionally  Peas Lentils Bran  Spinach Oatmeal Dried Beans and Peas  Asparagus Wheat Germ Mushrooms   Additional information about meat choices  Choose fish and poultry, particularly without skin, often.  Select lean, well trimmed cuts of meat.  Avoid all fatty meats, bacon , sausage, fried meats, fried fish, or poultry, luncheon meats, cold cuts, hot dogs, meats canned or frozen in gravy, spareribs and frozen and packaged prepared meats.   List III: Foods with HIGH purine content / Foods to AVOID (150-800 mg of purine nitrogen per 100 grams of food)  Anchovies Herring Meat Broths  Liver Mackerel Meat Extracts  Kidney Scallops Meat Drippings  Sardines Wild Game Mincemeat  Sweetbreads Goose Gravy  Heart Tongue Yeast, baker's and brewers   Commercial soups made with any of the foods listed in List II or List III  In addition avoid all alcoholic beverages

## 2013-10-04 ENCOUNTER — Other Ambulatory Visit: Payer: Self-pay | Admitting: *Deleted

## 2013-10-04 MED ORDER — SPIRONOLACTONE 25 MG PO TABS: 25.0000 mg | ORAL_TABLET | Freq: Every day | ORAL | Status: DC

## 2013-10-07 ENCOUNTER — Telehealth: Payer: Self-pay

## 2013-10-07 NOTE — Telephone Encounter (Signed)
Pt wanted noted in pt chart that his mail order pharmacy is [facility]; pt will contact [facility] to notify no longer using that pharmacy. When pt needs refills will call office; pt has already set up acct with [facility].

## 2013-10-08 ENCOUNTER — Other Ambulatory Visit: Payer: Self-pay | Admitting: *Deleted

## 2013-10-08 ENCOUNTER — Other Ambulatory Visit: Payer: Self-pay

## 2013-10-08 MED ORDER — METOPROLOL TARTRATE 100 MG PO TABS: ORAL_TABLET | ORAL | Status: DC

## 2013-10-08 MED ORDER — ATORVASTATIN CALCIUM 80 MG PO TABS: 40.0000 mg | ORAL_TABLET | Freq: Every day | ORAL | Status: DC

## 2013-10-08 MED ORDER — VALSARTAN-HYDROCHLOROTHIAZIDE 160-25 MG PO TABS: ORAL_TABLET | ORAL | Status: DC

## 2013-10-08 MED ORDER — AMLODIPINE BESYLATE 10 MG PO TABS: 10.0000 mg | ORAL_TABLET | Freq: Every day | ORAL | Status: DC

## 2013-11-08 ENCOUNTER — Other Ambulatory Visit: Payer: Self-pay

## 2013-11-08 MED ORDER — SPIRONOLACTONE 25 MG PO TABS: 25.0000 mg | ORAL_TABLET | Freq: Every day | ORAL | Status: DC

## 2013-11-08 NOTE — Telephone Encounter (Signed)
Pt request refill spironolactone to [facility]; pt has already cancelled refill given for spironolactone on 10/04/13.advised pt refill done.

## 2013-11-29 ENCOUNTER — Encounter: Payer: Self-pay | Admitting: Cardiology

## 2013-11-29 ENCOUNTER — Ambulatory Visit (INDEPENDENT_AMBULATORY_CARE_PROVIDER_SITE_OTHER): Payer: Medicare HMO | Admitting: Cardiology

## 2013-11-29 VITALS — BP 140/66 | HR 69 | Ht 68.0 in | Wt 196.1 lb

## 2013-11-29 DIAGNOSIS — I10 Essential (primary) hypertension: Secondary | ICD-10-CM

## 2013-11-29 DIAGNOSIS — I6529 Occlusion and stenosis of unspecified carotid artery: Secondary | ICD-10-CM

## 2013-11-29 DIAGNOSIS — Z951 Presence of aortocoronary bypass graft: Secondary | ICD-10-CM

## 2013-11-29 DIAGNOSIS — I2510 Atherosclerotic heart disease of native coronary artery without angina pectoris: Secondary | ICD-10-CM

## 2013-11-29 NOTE — Patient Instructions (Signed)
The current medical regimen is effective;  continue present plan and medications.  Your physician has requested that you have an exercise tolerance test. For further information please visit [email or web address]. Please also follow instruction sheet, as given.  Follow up in 1 year with Dr Hochrein.  You will receive a letter in the mail 2 months before you are due.  Please call us when you receive this letter to schedule your follow up appointment.  

## 2013-11-29 NOTE — Progress Notes (Signed)
HPI The patient presents for yearly follow up.  Since I last saw him he has done well. His wife had both her knees operated on and he has been her caregiver so he's not been exercising as much as he did previously. However, he still on his stationary bicycle 3 times per week.The patient denies any new symptoms such as chest discomfort, neck or arm discomfort. There has been no new shortness of breath, PND or orthopnea. There have been no reported palpitations, presyncope or syncope.  He has gained some weight.   No Known Allergies  Current Outpatient Prescriptions  Medication Sig Dispense Refill  . amLODipine (NORVASC) 10 MG tablet Take 1 tablet (10 mg total) by mouth daily.  90 tablet  2  . aspirin 81 MG tablet Take 81 mg by mouth daily.        Marland Kitchen atorvastatin (LIPITOR) 80 MG tablet Take 0.5 tablets (40 mg total) by mouth daily.  45 tablet  2  . metoprolol (LOPRESSOR) 100 MG tablet TAKE 1 TABLET TWICE A DAY  180 tablet  3  . Multiple Vitamins-Minerals (BL MULTIPLE VITAMINS) TABS Take 1 tablet by mouth daily.        . sildenafil (VIAGRA) 100 MG tablet Take 1 tablet (100 mg total) by mouth daily. As needed  8 tablet  12  . spironolactone (ALDACTONE) 25 MG tablet Take 1 tablet (25 mg total) by mouth daily.  90 tablet  2  . timolol (BETIMOL) 0.5 % ophthalmic solution 1 drop as directed.        . valsartan-hydrochlorothiazide (DIOVAN-HCT) 160-25 MG per tablet TAKE 1 TABLET DAILY  90 tablet  2   No current facility-administered medications for this visit.    Past Medical History  Diagnosis Date  . Hyperlipidemia 03/17/1999  . Hypertension 03/17/1999  . GERD (gastroesophageal reflux disease) 09/16/1996  . CAD (coronary artery disease) 07/17/2004  . Diverticulosis of colon 12/15/2005  . Glaucoma   . History of tobacco abuse     Quit 2000  . Esophageal stricture   . Gastritis   . Stress fracture of foot     right midfoot, per Dr. Canary Brim 2012    Past Surgical History  Procedure Laterality Date    . Esophagogastroduodenoscopy  10/08/2000    Stricture distal esoph, repeat dilation done 2012  . Esophagogastroduodenoscopy  10/08/2000    Stricture distal esoph  . 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 Roxy Manns with a LIMA to the LAD, RIMA to the distal right coronary artery, spahenous vein graft first diagonal, saphenous vein graft to the first circumflex marginal, sequential saphenous vein graft to the second  . Colonoscopy  12/30/2005    Divertics, mild, 10 yrs  . Colonoscopy  11/26/2006    Colitis biopsy neg    ROS:  As stated in the HPI and negative for all other systems.  PHYSICAL EXAM BP 140/66  Pulse 69  Ht 5\' 8"  (1.727 m)  Wt 196 lb 1.9 oz (88.959 kg)  BMI 29.83 kg/m2 GENERAL:  Well appearing HEENT:  Pupils equal round and reactive, fundi not visualized, oral mucosa unremarkable NECK:  No jugular venous distention, waveform within normal limits, carotid upstroke brisk and symmetric, no bruits, no thyromegaly LYMPHATICS:  No cervical, inguinal adenopathy LUNGS:  Clear to auscultation bilaterally BACK:  No CVA tenderness CHEST:  Well healed sternotomy scar. HEART:  PMI not displaced or sustained,S1 and S2 within normal limits, no S3, no S4, no clicks, no  rubs, no murmurs ABD:  Flat, positive bowel sounds normal in frequency in pitch, no bruits, no rebound, no guarding, no midline pulsatile mass, no hepatomegaly, no splenomegaly EXT:  2 plus pulses throughout, no edema, no cyanosis no clubbing   EKG:  Sinus rhythm, rate 70 axis within normal limits, intervals within normal limits, no acute ST-T wave changes. 11/29/2013   ASSESSMENT AND PLAN  CAD:  The patient has no new sypmtoms.  He has been 10 years since his bypass. I will bring the patient back for a POET (Plain Old Exercise Test). This will allow me to screen for obstructive coronary disease, risk stratify and very importantly provide a prescription for exercise.  HTN:  The blood pressure is at target. No  change in medications is indicated. We will continue with therapeutic lifestyle changes (TLC).  CAROTID STENOSIS:  He has moderate stenosis was 0-39% right and 40-59% left. I reviewed the 2014 study.  He will have this repeated in December.  WEIGHT:  The patient understands the need to lose weight with diet and exercise. We have discussed specific strategies for this.

## 2014-01-05 ENCOUNTER — Ambulatory Visit (INDEPENDENT_AMBULATORY_CARE_PROVIDER_SITE_OTHER): Payer: Commercial Managed Care - HMO | Admitting: Physician Assistant

## 2014-01-05 DIAGNOSIS — I2510 Atherosclerotic heart disease of native coronary artery without angina pectoris: Secondary | ICD-10-CM

## 2014-01-05 DIAGNOSIS — Z951 Presence of aortocoronary bypass graft: Secondary | ICD-10-CM

## 2014-01-05 NOTE — Progress Notes (Signed)
Exercise Treadmill Test  Pre-Exercise Testing Evaluation Rhythm: normal sinus  Rate: 65 bpm     Test  Exercise Tolerance Test Ordering MD: Marijo File, MD  Interpreting MD: Richardson Dopp, PA-C  Unique Test No: 1  Treadmill:  1  Indication for ETT: known ASHD  Contraindication to ETT: No   Stress Modality: exercise - treadmill  Cardiac Imaging Performed: non   Protocol: standard Bruce - maximal  Max BP:  206/76  Max MPHR (bpm):  153 85% MPR (bpm):  130  MPHR obtained (bpm):  142 % MPHR obtained:  92  Reached 85% MPHR (min:sec):  6:43 Total Exercise Time (min-sec):  9:00  Workload in METS:  10.1 Borg Scale: 17  Reason ETT Terminated:  patient's desire to stop    ST Segment Analysis At Rest: normal ST segments - no evidence of significant ST depression With Exercise: borderline ST changes  Other Information Arrhythmia:  No Angina during ETT:  absent (0) Quality of ETT:  indeterminate  ETT Interpretation:  borderline (indeterminate) with non-specific ST changes  Comments: Good exercise capacity. No chest pain. Normal BP response to exercise. There was borderline (~1 mm) inferolateral ST depression at peak exercise.    Recommendations: Arrange ETT-Myoview. Discussed with patient. Signed,  Richardson Dopp, PA-C   01/05/2014 12:08 PM

## 2014-01-05 NOTE — Patient Instructions (Signed)
Your physician has requested that you have en exercise stress myoview. For further information please visit [email or web address]. Please follow instruction sheet, as given.   

## 2014-01-17 ENCOUNTER — Encounter: Payer: Self-pay | Admitting: Cardiology

## 2014-01-24 ENCOUNTER — Ambulatory Visit (HOSPITAL_COMMUNITY): Payer: Medicare HMO | Attending: Cardiology | Admitting: Radiology

## 2014-01-24 VITALS — BP 144/77 | HR 78 | Ht 68.0 in | Wt 194.0 lb

## 2014-01-24 DIAGNOSIS — Z951 Presence of aortocoronary bypass graft: Secondary | ICD-10-CM | POA: Insufficient documentation

## 2014-01-24 DIAGNOSIS — R9439 Abnormal result of other cardiovascular function study: Secondary | ICD-10-CM | POA: Diagnosis not present

## 2014-01-24 DIAGNOSIS — I2510 Atherosclerotic heart disease of native coronary artery without angina pectoris: Secondary | ICD-10-CM | POA: Diagnosis not present

## 2014-01-24 MED ORDER — TECHNETIUM TC 99M SESTAMIBI GENERIC - CARDIOLITE
30.0000 | Freq: Once | INTRAVENOUS | Status: AC | PRN
  Administered 2014-01-24: 30 via INTRAVENOUS

## 2014-01-24 MED ORDER — TECHNETIUM TC 99M SESTAMIBI GENERIC - CARDIOLITE
10.0000 | Freq: Once | INTRAVENOUS | Status: AC | PRN
  Administered 2014-01-24: 10 via INTRAVENOUS

## 2014-01-24 NOTE — Progress Notes (Signed)
[name]UCLE[facility] [place], [place] [address] [phone]    Cardiology Nuclear Med Study  STEVEN BASSO is a 68 y.o. male     MRN : [id number]     DOB: 1945-12-05  Procedure Date: 01/24/2014  Nuclear Med Background Indication for Stress Test:  Evaluation for Ischemia, Graft Patency and Abnormal EKG with GXT,Nonspecific ST changes History:  CAD, MI, Cath, CABG 2005, Echo, Glaucoma Cardiac Risk Factors: Carotid Disease, History of Smoking, Hypertension and Lipids  Symptoms:  None indicated   Nuclear Pre-Procedure Caffeine/Decaff Intake:  None NPO After: 9:30pm   Lungs:  clear O2 Sat: 98% on room air. IV 0.9% NS with Angio Cath:  20g  IV Site: R Hand  IV Started by:  Matilde Haymaker, RN  Chest Size (in):  42 Cup Size: n/a  Height: 5\' 8"  (1.727 m)  Weight:  194 lb (87.998 kg)  BMI:  Body mass index is 29.5 kg/(m^2). Tech Comments:  No Lopressor x 12 hrs    Nuclear Med Study 1 or 2 day study: 1 day  Stress Test Type:  Stress  Reading MD: n/a  Order Authorizing Provider:  Benjamin Stain  Resting Radionuclide: Technetium 69m Sestamibi  Resting Radionuclide Dose: 11.0 mCi   Stress Radionuclide:  Technetium 55m Sestamibi  Stress Radionuclide Dose: 33.0 mCi           Stress Protocol Rest HR: 78 Stress HR: 141  Rest BP: 144/77 Stress BP: 171/75  Exercise Time (min): 7:30 METS: 9.3           Dose of Adenosine (mg):  n/a Dose of Lexiscan: n/a mg  Dose of Atropine (mg): n/a Dose of Dobutamine: n/a mcg/kg/min (at max HR)  Stress Test Technologist: Glade Lloyd, BS-ES  Nuclear Technologist:  Charlton Amor, CNMT     Rest Procedure:  Myocardial perfusion imaging was performed at rest 45 minutes following the intravenous administration of Technetium 48m Sestamibi. Rest ECG: NSR - Normal EKG  Stress Procedure:  The patient exercised on the treadmill utilizing the Bruce Protocol for 7:30 minutes. The patient stopped due to fatigue  and denied any chest pain.  Technetium 49m Sestamibi was injected at peak exercise and myocardial perfusion imaging was performed after a brief delay. Stress ECG: There is 1.5 mm ST depression in inferior leads at peak exercise.  QPS Raw Data Images:  Normal; no motion artifact; normal heart/lung ratio. Stress Images:  Normal homogeneous uptake in all areas of the myocardium. Rest Images:  Normal homogeneous uptake in all areas of the myocardium. Subtraction (SDS):  No evidence of ischemia. Transient Ischemic Dilatation (Normal <1.22):  0.96 Lung/Heart Ratio (Normal <0.45):  0.33  Quantitative Gated Spect Images QGS EDV:  93 ml QGS ESV:  40 ml  Impression Exercise Capacity:  Good exercise capacity. BP Response:  Normal blood pressure response. Clinical Symptoms:  No chest pain. ECG Impression:  There is 1.5 mm ST depression in inferior leads at peak exercise. Comparison with Prior Nuclear Study: No previous nuclear study performed  Overall Impression:  Low risk stress nuclear study.  Imaging shows no ischemia or scar. No chest pain. EKG shows ST depression at peak exercise. LV systolic function and wall motion are normal.  LV Ejection Fraction: 57%.  LV Wall Motion:  Normal Wall Motion  Darlin Coco MD

## 2014-01-25 ENCOUNTER — Encounter: Payer: Self-pay | Admitting: Physician Assistant

## 2014-02-10 ENCOUNTER — Other Ambulatory Visit: Payer: Self-pay

## 2014-02-21 ENCOUNTER — Ambulatory Visit (INDEPENDENT_AMBULATORY_CARE_PROVIDER_SITE_OTHER): Payer: Commercial Managed Care - HMO

## 2014-02-21 ENCOUNTER — Ambulatory Visit (INDEPENDENT_AMBULATORY_CARE_PROVIDER_SITE_OTHER): Payer: Commercial Managed Care - HMO | Admitting: Podiatry

## 2014-02-21 ENCOUNTER — Encounter: Payer: Self-pay | Admitting: Podiatry

## 2014-02-21 VITALS — BP 149/87 | HR 64 | Resp 16

## 2014-02-21 DIAGNOSIS — R52 Pain, unspecified: Secondary | ICD-10-CM

## 2014-02-21 DIAGNOSIS — M779 Enthesopathy, unspecified: Secondary | ICD-10-CM

## 2014-02-21 MED ORDER — TRIAMCINOLONE ACETONIDE 10 MG/ML IJ SUSP: 10.0000 mg | Freq: Once | INTRAMUSCULAR | Status: DC

## 2014-02-21 NOTE — Progress Notes (Signed)
   Subjective:    Patient ID: [name], male    DOB: 12/23/1945, 68 y.o.   MRN: [id number]  HPI  Right foot pain. " It swells up and hard to walk, painful when I put pressure on it" lasting 3 days  Review of Systems  All other systems reviewed and are negative.      Objective:   Physical Exam        Assessment & Plan:

## 2014-02-22 NOTE — Progress Notes (Signed)
Subjective:     Patient ID: Alan [name], male   DOB: 09-05-1946, 68 y.o.   MRN: [id number]  HPI patient presents stating I have had some swelling and pain in my foot for the last 3 days that reminds me of gout   Review of Systems     Objective:   Physical Exam Neurovascular status intact with no health history changes noted with significant discomfort around the first metatarsal head right with redness and inflammation noted    Assessment:     Capsulitis with probable gout of the first MPJ right    Plan:     Only gets occasional attacks and at this point we will continue to treat conservatively. Injected around the right first MPJ 3 mg Kenalog 5 mg Xylocaine Marcaine mixture and instructed on soaks and the possibility for oral meds if it does not get better

## 2014-06-15 ENCOUNTER — Ambulatory Visit (INDEPENDENT_AMBULATORY_CARE_PROVIDER_SITE_OTHER): Payer: Medicare HMO

## 2014-06-15 DIAGNOSIS — Z23 Encounter for immunization: Secondary | ICD-10-CM

## 2014-06-16 ENCOUNTER — Ambulatory Visit (INDEPENDENT_AMBULATORY_CARE_PROVIDER_SITE_OTHER): Payer: Commercial Managed Care - HMO | Admitting: Family Medicine

## 2014-06-16 ENCOUNTER — Ambulatory Visit: Payer: Medicare HMO | Admitting: Family Medicine

## 2014-06-16 ENCOUNTER — Encounter: Payer: Self-pay | Admitting: Family Medicine

## 2014-06-16 VITALS — BP 118/62 | HR 64 | Temp 98.3°F | Ht 68.0 in | Wt 196.8 lb

## 2014-06-16 DIAGNOSIS — R059 Cough, unspecified: Secondary | ICD-10-CM

## 2014-06-16 DIAGNOSIS — R05 Cough: Secondary | ICD-10-CM

## 2014-06-16 MED ORDER — GUAIFENESIN-CODEINE 100-10 MG/5ML PO SYRP: 5.0000 mL | ORAL_SOLUTION | Freq: Three times a day (TID) | ORAL | Status: DC | PRN

## 2014-06-16 NOTE — Progress Notes (Signed)
Pre visit review using our clinic review tool, if applicable. No additional management support is needed unless otherwise documented below in the visit note.  Sx for the last week, URI sx.  Was taking OTC meds for rhinitis, mucinex and claritin, gargling with salt water.  Worse last night, more cough last night.  No fevers.  No vomiting, no diarrhea.  Stuffy.  Minimal ST, but a tickle in the throat.  No ear pain. No myalgias.  He doesn't feel awful, but the nocturnal cough was bothersome.  No discolored sputum.  If the cough were better, he'd feel better.   Meds, vitals, and allergies reviewed.   ROS: See HPI.  Otherwise, noncontributory.  GEN: nad, alert and oriented HEENT: mucous membranes moist, tm w/o erythema, nasal exam w/o erythema, clear discharge noted,  OP with cobblestoning, sinuses not ttp NECK: supple w/o LA CV: rrr.   PULM: ctab, no inc wob EXT: no edema SKIN: no acute rash

## 2014-06-16 NOTE — Patient Instructions (Signed)
Use the cough medicine as needed, keep taking mucinex and drink plenty of fluids.  This should gradually get better.  If not, then let me know.  Take care.  Glad to see you.

## 2014-06-17 NOTE — Assessment & Plan Note (Signed)
Nontoxic, likely viral.  Okay for outpatient f/u.  Rest and fluids, use cough medicine in the meantime, call back prn. He agrees.  No indication for abx based on exam.

## 2014-06-26 ENCOUNTER — Telehealth: Payer: Self-pay | Admitting: Family Medicine

## 2014-06-26 ENCOUNTER — Other Ambulatory Visit: Payer: Self-pay | Admitting: Family Medicine

## 2014-06-26 DIAGNOSIS — I10 Essential (primary) hypertension: Secondary | ICD-10-CM

## 2014-06-26 NOTE — Telephone Encounter (Signed)
Please call pt.  I couldn't find good evidence to screen for renal cancer.  However, with this family history it is not unreasonable to check his urine for blood when he comes in for his CPE labs.  I wouldn't do more than that unless his urine was abnormal.  I put in the orders for everything.  Thanks.

## 2014-06-27 NOTE — Telephone Encounter (Signed)
Patient notified as instructed by telephone. 

## 2014-08-01 ENCOUNTER — Other Ambulatory Visit: Payer: Self-pay | Admitting: Family Medicine

## 2014-08-01 NOTE — Telephone Encounter (Signed)
Received refill request electronically from pharmacy. See drug waring. Is it okay to refill medication?

## 2014-08-02 NOTE — Telephone Encounter (Signed)
Okay, sent.

## 2014-08-04 ENCOUNTER — Telehealth: Payer: Self-pay

## 2014-08-04 NOTE — Telephone Encounter (Signed)
Pt left v/m to confirm refill done; pt did not leave name of med; left v/m requesting cb.

## 2014-08-04 NOTE — Telephone Encounter (Signed)
Pt called back to ck on spironlactone refill; advised sent to [facility] mail order on 08/02/14 and pt will ck with pharmacy.

## 2014-08-10 ENCOUNTER — Other Ambulatory Visit (INDEPENDENT_AMBULATORY_CARE_PROVIDER_SITE_OTHER): Payer: Commercial Managed Care - HMO

## 2014-08-10 DIAGNOSIS — I10 Essential (primary) hypertension: Secondary | ICD-10-CM

## 2014-08-10 LAB — COMPREHENSIVE METABOLIC PANEL
ALT: 22 U/L (ref 0–53)
AST: 14 U/L (ref 0–37)
Albumin: 4 g/dL (ref 3.5–5.2)
Alkaline Phosphatase: 62 U/L (ref 39–117)
BILIRUBIN TOTAL: 1 mg/dL (ref 0.2–1.2)
BUN: 19 mg/dL (ref 6–23)
CO2: 29 meq/L (ref 19–32)
Calcium: 9.4 mg/dL (ref 8.4–10.5)
Chloride: 97 mEq/L (ref 96–112)
Creatinine, Ser: 1.2 mg/dL (ref 0.4–1.5)
GFR: 62.17 mL/min (ref >60.00)
GLUCOSE: 91 mg/dL (ref 70–99)
Potassium: 4.1 mEq/L (ref 3.5–5.1)
SODIUM: 134 meq/L — AB (ref 135–145)
TOTAL PROTEIN: 7.1 g/dL (ref 6.0–8.3)

## 2014-08-10 LAB — LIPID PANEL
CHOLESTEROL: 136 mg/dL (ref 0–200)
HDL: 47.9 mg/dL (ref >39.00)
LDL Cholesterol: 57 mg/dL (ref 0–99)
NonHDL: 88.1
Total CHOL/HDL Ratio: 3
Triglycerides: 158 mg/dL — ABNORMAL HIGH (ref 0.0–149.0)
VLDL: 31.6 mg/dL (ref 0.0–40.0)

## 2014-08-11 LAB — URINALYSIS, COMPLETE
Bacteria, UA: NONE SEEN
Bilirubin Urine: NEGATIVE
CASTS: NONE SEEN
CRYSTALS: NONE SEEN
Glucose, UA: NEGATIVE mg/dL
Hgb urine dipstick: NEGATIVE
Ketones, ur: NEGATIVE mg/dL
LEUKOCYTES UA: NEGATIVE
Nitrite: NEGATIVE
PH: 6 (ref 5.0–8.0)
Protein, ur: NEGATIVE mg/dL
SPECIFIC GRAVITY, URINE: 1.012 (ref 1.005–1.030)
SQUAMOUS EPITHELIAL / LPF: NONE SEEN
Urobilinogen, UA: 1 mg/dL (ref 0.0–1.0)

## 2014-08-16 ENCOUNTER — Encounter: Payer: Medicare HMO | Admitting: Family Medicine

## 2014-08-17 ENCOUNTER — Telehealth: Payer: Self-pay | Admitting: Family Medicine

## 2014-08-17 DIAGNOSIS — I6529 Occlusion and stenosis of unspecified carotid artery: Secondary | ICD-10-CM

## 2014-08-17 NOTE — Telephone Encounter (Signed)
Patient coming in tomorrow. Would be reasonable to recheck his carotid ultrasound.  I put in the order.  Thanks.

## 2014-08-18 ENCOUNTER — Encounter: Payer: Self-pay | Admitting: Family Medicine

## 2014-08-18 ENCOUNTER — Ambulatory Visit (INDEPENDENT_AMBULATORY_CARE_PROVIDER_SITE_OTHER): Payer: Commercial Managed Care - HMO | Admitting: Family Medicine

## 2014-08-18 VITALS — BP 122/70 | HR 67 | Temp 98.0°F | Ht 68.0 in | Wt 196.0 lb

## 2014-08-18 DIAGNOSIS — Z0000 Encounter for general adult medical examination without abnormal findings: Secondary | ICD-10-CM

## 2014-08-18 DIAGNOSIS — Z136 Encounter for screening for cardiovascular disorders: Secondary | ICD-10-CM

## 2014-08-18 DIAGNOSIS — Z23 Encounter for immunization: Secondary | ICD-10-CM

## 2014-08-18 DIAGNOSIS — I10 Essential (primary) hypertension: Secondary | ICD-10-CM

## 2014-08-18 DIAGNOSIS — Z7189 Other specified counseling: Secondary | ICD-10-CM

## 2014-08-18 DIAGNOSIS — I6529 Occlusion and stenosis of unspecified carotid artery: Secondary | ICD-10-CM

## 2014-08-18 DIAGNOSIS — E785 Hyperlipidemia, unspecified: Secondary | ICD-10-CM

## 2014-08-18 DIAGNOSIS — I861 Scrotal varices: Secondary | ICD-10-CM

## 2014-08-18 MED ORDER — SILDENAFIL CITRATE 100 MG PO TABS: 100.0000 mg | ORAL_TABLET | Freq: Every day | ORAL | Status: DC

## 2014-08-18 NOTE — Telephone Encounter (Signed)
Rosaria Ferries asked that the patient stop by her office prior to leaving the [facility] today.  Message will be given to patient.

## 2014-08-18 NOTE — Patient Instructions (Signed)
Rosaria Ferries will call about your referral (carotid study and AAA screening). Take care.  Glad to see you.

## 2014-08-18 NOTE — Progress Notes (Signed)
Pre visit review using our clinic review tool, if applicable. No additional management support is needed unless otherwise documented below in the visit note.  I have personally reviewed the Medicare Annual Wellness questionnaire and have noted 1. The patient's medical and social history 2. Their use of alcohol, tobacco or illicit drugs 3. Their current medications and supplements 4. The patient's functional ability including ADL's, fall risks, home safety risks and hearing or visual             impairment. 5. Diet and physical activities 6. Evidence for depression or mood disorders  The patients weight, height, BMI have been recorded in the chart and visual acuity is per eye clinic.  I have made referrals, counseling and provided education to the patient based review of the above and I have provided the pt with a written personalized care plan for preventive services.  Provider list updated- see scanned forms.  Routine anticipatory guidance given to patient.  See health maintenance.  Flu 2015 Shingles 2013 per patient report PNA 2012 Tetanus 2010 Colonoscopy 2008 Prostate cancer screening and PSA options (with potential risks and benefits of testing vs not testing) were discussed along with recent recs/guidelines.  He declined testing PSA at this point. Advance directive- wife designated if patient were incapacitated.  Cognitive function addressed- see scanned forms- and if abnormal then additional documentation follows.  AAA screening pending.   Carotid f/u pending. No focal neuro changes. No neck pain. No concerns per patient.   Hypertension:    Using medication without problems or lightheadedness: yes Chest pain with exertion:no Edema:occ BLE edema, mild Short of breath:no  Elevated Cholesterol: Using medications without problems:yes Muscle aches: no Diet compliance:yes Exercise:yes  He has some scrotal lesions that concerned him. Not ttp. See exam.   PMH and SH  reviewed  Meds, vitals, and allergies reviewed.   ROS: See HPI.  Otherwise negative.    GEN: nad, alert and oriented HEENT: mucous membranes moist NECK: supple w/o LA CV: rrr. PULM: ctab, no inc wob ABD: soft, +bs EXT: no edema SKIN: no acute rash but small nonirritated scrotal varices noted.

## 2014-08-21 DIAGNOSIS — I861 Scrotal varices: Secondary | ICD-10-CM | POA: Insufficient documentation

## 2014-08-21 DIAGNOSIS — Z7189 Other specified counseling: Secondary | ICD-10-CM | POA: Insufficient documentation

## 2014-08-21 NOTE — Assessment & Plan Note (Signed)
Flu 2015  Shingles 2013 per patient report  PNA 2012  Tetanus 2010  Colonoscopy 2008  Prostate cancer screening and PSA options (with potential risks and benefits of testing vs not testing) were discussed along with recent recs/guidelines. He declined testing PSA at this point.  Advance directive- wife designated if patient were incapacitated.  Cognitive function addressed- see scanned forms- and if abnormal then additional documentation follows.  AAA screening pending.

## 2014-08-21 NOTE — Assessment & Plan Note (Signed)
Small, nonbothersome, wouldn't intervene.

## 2014-08-21 NOTE — Assessment & Plan Note (Signed)
Controlled, continue as is.  No change in meds.  Labs d/w pt.  

## 2014-08-21 NOTE — Assessment & Plan Note (Signed)
F/u scan pending.  D/w pt.

## 2014-08-21 NOTE — Assessment & Plan Note (Signed)
Controlled except for mild inc in TG, continue as is.  No change in meds.  Labs d/w pt.  Continue diet and exercise.

## 2014-08-22 ENCOUNTER — Other Ambulatory Visit: Payer: Self-pay | Admitting: Family Medicine

## 2014-08-24 ENCOUNTER — Other Ambulatory Visit (HOSPITAL_COMMUNITY): Payer: Self-pay | Admitting: *Deleted

## 2014-08-24 DIAGNOSIS — Z139 Encounter for screening, unspecified: Secondary | ICD-10-CM

## 2014-08-24 DIAGNOSIS — I6523 Occlusion and stenosis of bilateral carotid arteries: Secondary | ICD-10-CM

## 2014-09-05 ENCOUNTER — Other Ambulatory Visit: Payer: Self-pay | Admitting: Family Medicine

## 2014-09-12 ENCOUNTER — Other Ambulatory Visit: Payer: Self-pay | Admitting: Family Medicine

## 2014-09-15 ENCOUNTER — Encounter (INDEPENDENT_AMBULATORY_CARE_PROVIDER_SITE_OTHER): Payer: Commercial Managed Care - HMO

## 2014-09-15 DIAGNOSIS — I10 Essential (primary) hypertension: Secondary | ICD-10-CM

## 2014-09-15 DIAGNOSIS — I6523 Occlusion and stenosis of bilateral carotid arteries: Secondary | ICD-10-CM

## 2014-09-15 DIAGNOSIS — Z139 Encounter for screening, unspecified: Secondary | ICD-10-CM

## 2014-09-15 DIAGNOSIS — Z136 Encounter for screening for cardiovascular disorders: Secondary | ICD-10-CM

## 2014-09-28 ENCOUNTER — Telehealth: Payer: Self-pay

## 2014-09-28 NOTE — Telephone Encounter (Signed)
Pt has non prod cough with wheezing at night for few days. Pt taking mucinex DM and allergy pill with no relief. Pt is not resting well at night. No fever or SOB. Pt scheduled appt to see Dr Damita Dunnings 09/29/14 at 12:30 pm. Offered pt appt today but pt prefers to see Dr Damita Dunnings. If pt condition changes or worsens prior to appt pt will cb or go to [facility].

## 2014-09-28 NOTE — Telephone Encounter (Signed)
Noted, thanks!

## 2014-09-29 ENCOUNTER — Ambulatory Visit (INDEPENDENT_AMBULATORY_CARE_PROVIDER_SITE_OTHER): Payer: Commercial Managed Care - HMO | Admitting: Family Medicine

## 2014-09-29 ENCOUNTER — Encounter: Payer: Self-pay | Admitting: Family Medicine

## 2014-09-29 VITALS — BP 122/58 | HR 67 | Temp 98.8°F | Wt 198.2 lb

## 2014-09-29 DIAGNOSIS — R059 Cough, unspecified: Secondary | ICD-10-CM

## 2014-09-29 DIAGNOSIS — R05 Cough: Secondary | ICD-10-CM

## 2014-09-29 MED ORDER — GUAIFENESIN-CODEINE 100-10 MG/5ML PO SYRP: 5.0000 mL | ORAL_SOLUTION | Freq: Three times a day (TID) | ORAL | Status: DC | PRN

## 2014-09-29 MED ORDER — ALBUTEROL SULFATE HFA 108 (90 BASE) MCG/ACT IN AERS: 1.0000 | INHALATION_SPRAY | Freq: Four times a day (QID) | RESPIRATORY_TRACT | Status: DC | PRN

## 2014-09-29 NOTE — Progress Notes (Signed)
Pre visit review using our clinic review tool, if applicable. No additional management support is needed unless otherwise documented below in the visit note.  Sx started about 1 week ago.  Clearing his throat and coughing, chest congestion and wheezing noted.  Gargling at baseline.  No ST.  No sputum usually, but some clearing of drainage.  No FCNAVD.  Sleep disrupted recently.  Cheratussin helped some with the cough, but he's out of that now.    Meds, vitals, and allergies reviewed.   ROS: See HPI.  Otherwise, noncontributory.  GEN: nad, alert and oriented HEENT: mucous membranes moist, tm w/o erythema, nasal exam w/o erythema, clear discharge noted,  OP with cobblestoning NECK: supple w/o LA CV: rrr.   PULM: ctab except for scant exp wheeze, no inc wob, no focal dec in BS EXT: no edema SKIN: no acute rash

## 2014-09-29 NOTE — Patient Instructions (Signed)
Use the cough syrup and the inhaler in the meantime.  Both should help.  If not better, then let me know.  Take care.

## 2014-09-30 NOTE — Assessment & Plan Note (Signed)
Nontoxic, okay for outpatient f/u.  Start SABA, continue cheratussin and f/u prn.  He agrees.

## 2014-11-16 ENCOUNTER — Other Ambulatory Visit: Payer: Self-pay | Admitting: Family Medicine

## 2014-11-16 NOTE — Telephone Encounter (Signed)
Received refill request electronically from pharmacy. Warning with Spironolactone and Diovan. Is it okay to refill?

## 2014-11-17 NOTE — Telephone Encounter (Signed)
Okay to continue, K wnl prev.  Thanks.  Sent.

## 2014-11-18 ENCOUNTER — Ambulatory Visit (INDEPENDENT_AMBULATORY_CARE_PROVIDER_SITE_OTHER): Payer: Commercial Managed Care - HMO | Admitting: Cardiology

## 2014-11-18 ENCOUNTER — Encounter: Payer: Self-pay | Admitting: Cardiology

## 2014-11-18 VITALS — BP 128/60 | HR 60 | Ht 68.5 in | Wt 197.2 lb

## 2014-11-18 DIAGNOSIS — I2510 Atherosclerotic heart disease of native coronary artery without angina pectoris: Secondary | ICD-10-CM

## 2014-11-18 NOTE — Progress Notes (Signed)
HPI The patient presents for yearly follow up.  Since I last saw him he has done well. His wife is in the hospital apparently with significant medical problems and he is having to take care of her. He does notice a little dyspnea when he walks for long distances in the hospital particularly up an incline. However, this is mild. He's not getting any chest discomfort. He's not describing any PND or orthopnea. He's not having any palpitations, presyncope or syncope. He has no weight gain or edema. He did have a treadmill test last year that was equivocal but a stress perfusion study demonstrated a well preserved ejection fraction with no evidence of ischemia.  No Known Allergies  Current Outpatient Prescriptions  Medication Sig Dispense Refill  . amLODipine (NORVASC) 10 MG tablet TAKE 1 TABLET EVERY DAY 90 tablet 2  . aspirin 81 MG tablet Take 81 mg by mouth daily.      Marland Kitchen atorvastatin (LIPITOR) 80 MG tablet TAKE 1/2 TABLET EVERY DAY 45 tablet 2  . metoprolol (LOPRESSOR) 100 MG tablet TAKE 1 TABLET TWICE A DAY 180 tablet 3  . sildenafil (VIAGRA) 100 MG tablet Take 1 tablet (100 mg total) by mouth daily. As needed 8 tablet 12  . spironolactone (ALDACTONE) 25 MG tablet TAKE 1 TABLET EVERY DAY 90 tablet 3  . timolol (BETIMOL) 0.5 % ophthalmic solution 1 drop as directed.      . timolol (TIMOPTIC) 0.5 % ophthalmic solution Place 1 drop into both eyes daily.    . valsartan-hydrochlorothiazide (DIOVAN-HCT) 160-25 MG per tablet TAKE 1 TABLET EVERY DAY 90 tablet 3   Current Facility-Administered Medications  Medication Dose Route Frequency Provider Last Rate Last Dose  . triamcinolone acetonide (KENALOG) 10 MG/ML injection 10 mg  10 mg Other Once Wallene Huh, DPM        Past Medical History  Diagnosis Date  . Hyperlipidemia 03/17/1999  . Hypertension 03/17/1999  . GERD (gastroesophageal reflux disease) 09/16/1996  . CAD (coronary artery disease) 07/17/2004  . Diverticulosis of colon 12/15/2005  .  Glaucoma   . History of tobacco abuse     Quit 2000  . Esophageal stricture   . Gastritis   . Stress fracture of foot     right midfoot, per Dr. Canary Brim 2012  . Hx of cardiovascular stress test     ETT-Myoview (01/2014):  1.5 mm ST depression in inf leads at peak exercise; no ischemia or scar, EF 57%, Low Risk    Past Surgical History  Procedure Laterality Date  . Esophagogastroduodenoscopy  10/08/2000    Stricture distal esoph, repeat dilation done 2012  . Esophagogastroduodenoscopy  10/08/2000    Stricture distal esoph  . 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 Roxy Manns with a LIMA to the LAD, RIMA to the distal right coronary artery, spahenous vein graft first diagonal, saphenous vein graft to the first circumflex marginal, sequential saphenous vein graft to the second  . Colonoscopy  12/30/2005    Divertics, mild, 10 yrs  . Colonoscopy  11/26/2006    Colitis biopsy neg    ROS:  As stated in the HPI and negative for all other systems.  PHYSICAL EXAM BP 128/60 mmHg  Pulse 60  Ht 5' 8.5" (1.74 m)  Wt 197 lb 3.2 oz (89.449 kg)  BMI 29.54 kg/m2 GENERAL:  Well appearing HEENT:  Pupils equal round and reactive, fundi not visualized, oral mucosa unremarkable NECK:  No jugular venous distention, waveform within  normal limits, carotid upstroke brisk and symmetric, no bruits, no thyromegaly LYMPHATICS:  No cervical, inguinal adenopathy LUNGS:  Clear to auscultation bilaterally BACK:  No CVA tenderness CHEST:  Well healed sternotomy scar. HEART:  PMI not displaced or sustained,S1 and S2 within normal limits, no S3, no S4, no clicks, no rubs, no murmurs ABD:  Flat, positive bowel sounds normal in frequency in pitch, no bruits, no rebound, no guarding, no midline pulsatile mass, no hepatomegaly, no splenomegaly EXT:  2 plus pulses throughout, no edema, no cyanosis no clubbing   EKG:  Sinus rhythm, rate 60 axis within normal limits, intervals within normal limits, no acute ST-T  wave changes. 11/18/2014   ASSESSMENT AND PLAN  CAD:  He is having no new symptoms since his stress perfusion study that would suggest new onset angina. He has not been as active because of cold recently. He also hasn't been able to exercise because he is taking care of his wife. If he starts to get more dyspnea with activities as he starts back to his exercise regimen rather than improving over the weeks to come he would let me know. Otherwise he will continue the meds as listed.  HTN:  The blood pressure is at target. No change in medications is indicated. We will continue with therapeutic lifestyle changes (TLC).  CAROTID STENOSIS:  He has moderate stenosis was 0-39% right and 40-59% left. I reviewed the 2015 study.  He will have this in 2017.  CHOLESTEROL:  His HDL was 47.9 and his LDL 57. He will continue the meds as listed.  WEIGHT:  The patient understands the need to lose weight with diet and exercise. We have discussed specific strategies for this.

## 2014-11-18 NOTE — Patient Instructions (Signed)
Your physician recommends that you schedule a follow-up appointment in: one year with Dr. Hochrein  

## 2014-12-26 ENCOUNTER — Other Ambulatory Visit: Payer: Self-pay | Admitting: Cardiology

## 2015-02-02 DIAGNOSIS — L821 Other seborrheic keratosis: Secondary | ICD-10-CM | POA: Diagnosis not present

## 2015-02-02 DIAGNOSIS — L814 Other melanin hyperpigmentation: Secondary | ICD-10-CM | POA: Diagnosis not present

## 2015-02-02 DIAGNOSIS — D225 Melanocytic nevi of trunk: Secondary | ICD-10-CM | POA: Diagnosis not present

## 2015-02-02 DIAGNOSIS — L570 Actinic keratosis: Secondary | ICD-10-CM | POA: Diagnosis not present

## 2015-02-02 DIAGNOSIS — D1801 Hemangioma of skin and subcutaneous tissue: Secondary | ICD-10-CM | POA: Diagnosis not present

## 2015-02-22 ENCOUNTER — Encounter: Payer: Self-pay | Admitting: Primary Care

## 2015-02-22 ENCOUNTER — Ambulatory Visit (INDEPENDENT_AMBULATORY_CARE_PROVIDER_SITE_OTHER): Payer: Commercial Managed Care - HMO | Admitting: Primary Care

## 2015-02-22 VITALS — BP 134/64 | HR 63 | Temp 98.1°F | Ht 68.0 in | Wt 187.8 lb

## 2015-02-22 DIAGNOSIS — H6123 Impacted cerumen, bilateral: Secondary | ICD-10-CM

## 2015-02-22 NOTE — Progress Notes (Signed)
Subjective:    Patient ID: Alan [name], male    DOB: 06/06/1946, 69 y.o.   MRN: [id number]  HPI  Alan [name] is a 69 year old male who presents today with a chief complaint of ear fullness that is present to bilateral ears. His ear fullness has been present for one week while on vacation at the beach and has continued afterward. He's been swimming in the pool and ocean. Overall the fullness is the same but is not dissipating. He's tried applying ear wax removal drops for 3 days without any improvement. Denies fevers, chills, nasal congestion, pain.  Review of Systems  Constitutional: Negative for fever and chills.  HENT: Positive for hearing loss. Negative for congestion, ear pain, rhinorrhea and sore throat.        Past Medical History  Diagnosis Date  . Hyperlipidemia 03/17/1999  . Hypertension 03/17/1999  . GERD (gastroesophageal reflux disease) 09/16/1996  . CAD (coronary artery disease) 07/17/2004  . Diverticulosis of colon 12/15/2005  . Glaucoma   . History of tobacco abuse     Quit 2000  . Esophageal stricture   . Gastritis   . Stress fracture of foot     right midfoot, per Dr. Canary Brim 2012  . Hx of cardiovascular stress test     ETT-Myoview (01/2014):  1.5 mm ST depression in inf leads at peak exercise; no ischemia or scar, EF 57%, Low Risk    History   Social History  . Marital Status: Married    Spouse Name: N/A  . Number of Children: N/A  . Years of Education: N/A   Occupational History  . 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 Smoking status: Former Smoker    Types: Cigarettes    Quit date: 09/16/1998  . Smokeless tobacco: Never Used  . Alcohol Use: 16.8 oz/week    28 Cans of beer per week     Comment: 3-4 Bud Lights/day  . Drug Use: No  . Sexual Activity: Not on file   Other Topics Concern  . Not on file   Social History Narrative   Married in 1974   Lives with wife   Enjoys golf, fishing   Gets regular  exercise: cycling 4 days a week, stretching, weights    Past Surgical History  Procedure Laterality Date  . Esophagogastroduodenoscopy  10/08/2000    Stricture distal esoph, repeat dilation done 2012  . Esophagogastroduodenoscopy  10/08/2000    Stricture distal esoph  . 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 Roxy Manns with a LIMA to the LAD, RIMA to the distal right coronary artery, spahenous vein graft first diagonal, saphenous vein graft to the first circumflex marginal, sequential saphenous vein graft to the second  . Colonoscopy  12/30/2005    Divertics, mild, 10 yrs  . Colonoscopy  11/26/2006    Colitis biopsy neg    Family History  Problem Relation Age of Onset  . Peripheral vascular disease Mother   . Stroke Father     x2  . Hypertension Brother   . Heart attack Brother     MI  . Colon polyps Brother   . Heart disease Brother     MI  . Diabetes Neg Hx   . Depression Neg Hx   . Alcohol abuse Neg Hx   . Drug abuse Neg Hx   . Prostate cancer Neg Hx   .  Colon cancer Neg Hx   . Hypertension Sister   . Breast cancer Sister   . Cancer Sister     Breast CA  . Colon polyps Other   . Hypertension Sister   . Hypertension Sister     No Known Allergies  Current Outpatient Prescriptions on File Prior to Visit  Medication Sig Dispense Refill  . amLODipine (NORVASC) 10 MG tablet TAKE 1 TABLET EVERY DAY 90 tablet 2  . aspirin 81 MG tablet Take 81 mg by mouth daily.      Marland Kitchen atorvastatin (LIPITOR) 80 MG tablet TAKE 1/2 TABLET EVERY DAY 45 tablet 2  . metoprolol (LOPRESSOR) 100 MG tablet TAKE 1 TABLET TWICE A DAY 180 tablet 3  . sildenafil (VIAGRA) 100 MG tablet Take 1 tablet (100 mg total) by mouth daily. As needed 8 tablet 12  . spironolactone (ALDACTONE) 25 MG tablet TAKE 1 TABLET EVERY DAY 90 tablet 3  . timolol (BETIMOL) 0.5 % ophthalmic solution 1 drop as directed.      . timolol (TIMOPTIC) 0.5 % ophthalmic solution Place 1 drop into both eyes daily.    .  valsartan-hydrochlorothiazide (DIOVAN-HCT) 160-25 MG per tablet TAKE 1 TABLET EVERY DAY 90 tablet 3   Current Facility-Administered Medications on File Prior to Visit  Medication Dose Route Frequency Provider Last Rate Last Dose  . triamcinolone acetonide (KENALOG) 10 MG/ML injection 10 mg  10 mg Other Once [employer], DPM        BP 134/64 mmHg  Pulse 63  Temp(Src) 98.1 F (36.7 C) (Oral)  Ht 5\' 8"  (1.727 m)  Wt 187 lb 12.8 oz (85.186 kg)  BMI 28.56 kg/m2  SpO2 95%    Objective:   Physical Exam  Constitutional: He is oriented to person, place, and time. He does not appear ill.  HENT:  Nose: Nose normal. Right sinus exhibits no maxillary sinus tenderness and no frontal sinus tenderness. Left sinus exhibits no maxillary sinus tenderness and no frontal sinus tenderness.  Mouth/Throat: Oropharynx is clear and moist.  Bilateral ears impacted with ear wax. Attempted to clear with plastic curette without success. Irrigation completed by CMA with TM's unremarkable with small effusion noted to right ear.  Eyes: Conjunctivae are normal. Pupils are equal, round, and reactive to light.  Neck: Neck supple.  Cardiovascular: Normal rate and regular rhythm.   Pulmonary/Chest: Effort normal and breath sounds normal.  Lymphadenopathy:    He has no cervical adenopathy.  Neurological: He is alert and oriented to person, place, and time.  Skin: Skin is warm and dry.          Assessment & Plan:  Cerumen Impaction:  Ear fullness x 1 week while vacationing at the beach. Bilateral ears impacted with cerumen. Unsuccessful in removing cerumen with plastic curette. Irrigation completed. TM's WNL without erythema and do not appear to be infected.  Debrox drops for future cerumen impaction Follow up as needed.

## 2015-02-22 NOTE — Progress Notes (Signed)
Pre visit review using our clinic review tool, if applicable. No additional management support is needed unless otherwise documented below in the visit note. 

## 2015-02-22 NOTE — Patient Instructions (Signed)
Your ear fullness was likely due to ear wax impaction. You may use Debrox drops for any signs of ear fullness. You do have a little fluid to your right ear which will go away on its own. Your ears are not infected. It was nice meeting you!  Cerumen Impaction A cerumen impaction is when the wax in your ear forms a plug. This plug usually causes reduced hearing. Sometimes it also causes an earache or dizziness. Removing a cerumen impaction can be difficult and painful. The wax sticks to the ear canal. The canal is sensitive and bleeds easily. If you try to remove a heavy wax buildup with a cotton tipped swab, you may push it in further. Irrigation with water, suction, and small ear curettes may be used to clear out the wax. If the impaction is fixed to the skin in the ear canal, ear drops may be needed for a few days to loosen the wax. People who build up a lot of wax frequently can use ear wax removal products available in your local drugstore. SEEK MEDICAL CARE IF:  You develop an earache, increased hearing loss, or marked dizziness. Document Released: 10/10/2004 Document Revised: 11/25/2011 Document Reviewed: 11/30/2009 [facility] Patient Information 2015 [place], [place]. This information is not intended to replace advice given to you by your health care provider. Make sure you discuss any questions you have with your health care provider.

## 2015-04-06 DIAGNOSIS — H43813 Vitreous degeneration, bilateral: Secondary | ICD-10-CM | POA: Diagnosis not present

## 2015-04-06 DIAGNOSIS — H3531 Nonexudative age-related macular degeneration: Secondary | ICD-10-CM | POA: Diagnosis not present

## 2015-05-31 ENCOUNTER — Ambulatory Visit (INDEPENDENT_AMBULATORY_CARE_PROVIDER_SITE_OTHER): Payer: Commercial Managed Care - HMO

## 2015-05-31 DIAGNOSIS — Z23 Encounter for immunization: Secondary | ICD-10-CM

## 2015-06-12 ENCOUNTER — Other Ambulatory Visit: Payer: Self-pay | Admitting: Family Medicine

## 2015-07-10 ENCOUNTER — Other Ambulatory Visit: Payer: Self-pay | Admitting: Family Medicine

## 2015-08-06 ENCOUNTER — Other Ambulatory Visit: Payer: Self-pay | Admitting: Family Medicine

## 2015-08-06 DIAGNOSIS — E785 Hyperlipidemia, unspecified: Secondary | ICD-10-CM

## 2015-08-11 ENCOUNTER — Encounter: Payer: Self-pay | Admitting: Internal Medicine

## 2015-08-15 ENCOUNTER — Other Ambulatory Visit (INDEPENDENT_AMBULATORY_CARE_PROVIDER_SITE_OTHER): Payer: Commercial Managed Care - HMO

## 2015-08-15 DIAGNOSIS — E785 Hyperlipidemia, unspecified: Secondary | ICD-10-CM | POA: Diagnosis not present

## 2015-08-15 LAB — COMPREHENSIVE METABOLIC PANEL
ALT: 19 U/L (ref 0–53)
AST: 15 U/L (ref 0–37)
Albumin: 4 g/dL (ref 3.5–5.2)
Alkaline Phosphatase: 59 U/L (ref 39–117)
BILIRUBIN TOTAL: 0.7 mg/dL (ref 0.2–1.2)
BUN: 18 mg/dL (ref 6–23)
CALCIUM: 10.1 mg/dL (ref 8.4–10.5)
CHLORIDE: 98 meq/L (ref 96–112)
CO2: 32 meq/L (ref 19–32)
CREATININE: 1.13 mg/dL (ref 0.40–1.50)
GFR: 68.35 mL/min (ref >60.00)
Glucose, Bld: 97 mg/dL (ref 70–99)
Potassium: 4.4 mEq/L (ref 3.5–5.1)
SODIUM: 134 meq/L — AB (ref 135–145)
Total Protein: 7.3 g/dL (ref 6.0–8.3)

## 2015-08-15 LAB — LIPID PANEL
CHOL/HDL RATIO: 3
Cholesterol: 154 mg/dL (ref 0–200)
HDL: 50 mg/dL (ref >39.00)
LDL CALC: 68 mg/dL (ref 0–99)
NONHDL: 103.84
TRIGLYCERIDES: 181 mg/dL — AB (ref 0.0–149.0)
VLDL: 36.2 mg/dL (ref 0.0–40.0)

## 2015-08-21 ENCOUNTER — Encounter: Payer: Self-pay | Admitting: Family Medicine

## 2015-08-21 ENCOUNTER — Ambulatory Visit (INDEPENDENT_AMBULATORY_CARE_PROVIDER_SITE_OTHER): Payer: Commercial Managed Care - HMO | Admitting: Family Medicine

## 2015-08-21 ENCOUNTER — Other Ambulatory Visit: Payer: Self-pay | Admitting: Family Medicine

## 2015-08-21 VITALS — BP 124/72 | HR 65 | Temp 98.3°F | Ht 68.0 in | Wt 198.5 lb

## 2015-08-21 DIAGNOSIS — I10 Essential (primary) hypertension: Secondary | ICD-10-CM | POA: Diagnosis not present

## 2015-08-21 DIAGNOSIS — E785 Hyperlipidemia, unspecified: Secondary | ICD-10-CM | POA: Diagnosis not present

## 2015-08-21 DIAGNOSIS — Z119 Encounter for screening for infectious and parasitic diseases, unspecified: Secondary | ICD-10-CM | POA: Diagnosis not present

## 2015-08-21 DIAGNOSIS — Z0000 Encounter for general adult medical examination without abnormal findings: Secondary | ICD-10-CM | POA: Diagnosis not present

## 2015-08-21 MED ORDER — SPIRONOLACTONE 25 MG PO TABS: 25.0000 mg | ORAL_TABLET | Freq: Every day | ORAL | Status: DC

## 2015-08-21 MED ORDER — SILDENAFIL CITRATE 20 MG PO TABS: 100.0000 mg | ORAL_TABLET | Freq: Every day | ORAL | Status: DC | PRN

## 2015-08-21 MED ORDER — VALSARTAN-HYDROCHLOROTHIAZIDE 160-25 MG PO TABS: 1.0000 | ORAL_TABLET | Freq: Every day | ORAL | Status: DC

## 2015-08-21 MED ORDER — ATORVASTATIN CALCIUM 80 MG PO TABS: 40.0000 mg | ORAL_TABLET | Freq: Every day | ORAL | Status: DC

## 2015-08-21 MED ORDER — AMLODIPINE BESYLATE 10 MG PO TABS: 10.0000 mg | ORAL_TABLET | Freq: Every day | ORAL | Status: DC

## 2015-08-21 NOTE — Patient Instructions (Signed)
We'll contact you with your lab report. Take care.  Glad to see you.  Recheck yearly, labs ahead of time.

## 2015-08-21 NOTE — Assessment & Plan Note (Signed)
Controlled except for TG, needs work on weight and diet.  D/w pt re: both, along with labs.

## 2015-08-21 NOTE — Assessment & Plan Note (Signed)
Flu 2016 Shingles 2013 PNA 2015 Tetanus 2010 Colonoscopy 2008 Prostate cancer screening and PSA options (with potential risks and benefits of testing vs not testing) were discussed along with recent recs/guidelines.  He declined testing PSA at this point. Advance directive- wife designated if patient were incapacitated.   Cognitive function addressed- see scanned forms- and if abnormal then additional documentation follows.  Pt opts in for HCV screening.  D/w pt re: routine screening.

## 2015-08-21 NOTE — Assessment & Plan Note (Signed)
Controlled, needs work on weight and diet.  D/w pt re: both, along with labs.

## 2015-08-21 NOTE — Progress Notes (Signed)
Pre visit review using our clinic review tool, if applicable. No additional management support is needed unless otherwise documented below in the visit note.  I have personally reviewed the Medicare Annual Wellness questionnaire and have noted 1. The patient's medical and social history 2. Their use of alcohol, tobacco or illicit drugs 3. Their current medications and supplements 4. The patient's functional ability including ADL's, fall risks, home safety risks and hearing or visual             impairment. 5. Diet and physical activities 6. Evidence for depression or mood disorders  The patients weight, height, BMI have been recorded in the chart and visual acuity is per eye clinic.  I have made referrals, counseling and provided education to the patient based review of the above and I have provided the pt with a written personalized care plan for preventive services.  Provider list updated- see scanned forms.  Routine anticipatory guidance given to patient.  See health maintenance.  Flu 2016 Shingles 2013 PNA 2015 Tetanus 2010 Colonoscopy 2008 Prostate cancer screening and PSA options (with potential risks and benefits of testing vs not testing) were discussed along with recent recs/guidelines.  He declined testing PSA at this point. Advance directive- wife designated if patient were incapacitated.   Cognitive function addressed- see scanned forms- and if abnormal then additional documentation follows.  Pt opts in for HCV screening.  D/w pt re: routine screening.    Elevated Cholesterol: Using medications without problems: yes Muscle aches: no Diet compliance:yes Exercise:yes D/w pt about cutting back on etoh.    Hypertension:    Using medication without problems or lightheadedness: yes Chest pain with exertion:no Edema:no Short of breath:no  PMH and SH reviewed  Meds, vitals, and allergies reviewed.   ROS: See HPI.  Otherwise negative.    GEN: nad, alert and  oriented HEENT: mucous membranes moist NECK: supple w/o LA CV: rrr. PULM: ctab, no inc wob ABD: soft, +bs EXT: no edema SKIN: no acute rash

## 2015-08-22 LAB — HEPATITIS C ANTIBODY: HCV Ab: NEGATIVE

## 2015-08-24 ENCOUNTER — Other Ambulatory Visit: Payer: Self-pay | Admitting: *Deleted

## 2015-08-24 ENCOUNTER — Encounter: Payer: Self-pay | Admitting: *Deleted

## 2015-08-24 MED ORDER — SILDENAFIL CITRATE 20 MG PO TABS: 100.0000 mg | ORAL_TABLET | Freq: Every day | ORAL | Status: DC | PRN

## 2015-08-24 NOTE — Telephone Encounter (Signed)
Printed.  Thanks.  

## 2015-08-24 NOTE — Telephone Encounter (Signed)
Insurance will not cover this medication.  Patient requests written Rx to be mailed to him for fill at [facility] or [place].

## 2015-08-25 NOTE — Telephone Encounter (Signed)
Rx mailed to patient.

## 2015-11-17 ENCOUNTER — Encounter: Payer: Self-pay | Admitting: Gastroenterology

## 2015-11-23 NOTE — Progress Notes (Signed)
HPI The patient presents for yearly follow up.  Since I last saw him he has done well.  He is exercising.  The patient denies any new symptoms such as chest discomfort, neck or arm discomfort. There has been no new shortness of breath, PND or orthopnea. There have been no reported palpitations, presyncope or syncope.   No Known Allergies  Current Outpatient Prescriptions  Medication Sig Dispense Refill  . amLODipine (NORVASC) 10 MG tablet Take 1 tablet (10 mg total) by mouth daily. 90 tablet 3  . aspirin 81 MG tablet Take 81 mg by mouth daily.      Marland Kitchen atorvastatin (LIPITOR) 80 MG tablet Take 0.5 tablets (40 mg total) by mouth daily. 45 tablet 3  . [facility] IBUPROFEN IB 200 MG tablet As needed    . Loratadine (CLARITIN PO) Take 1 tablet by mouth daily as needed. Take 1 tab by mouth daily as needed    . metoprolol (LOPRESSOR) 100 MG tablet TAKE 1 TABLET TWICE A DAY 180 tablet 3  . RaNITidine HCl (RANITIDINE 75 PO) Take 1 tablet by mouth daily as needed. Take 1 tab by mouth daily as needed    . sildenafil (REVATIO) 20 MG tablet Take 5 tablets (100 mg total) by mouth daily as needed. 50 tablet 12  . spironolactone (ALDACTONE) 25 MG tablet Take 1 tablet (25 mg total) by mouth daily. 90 tablet 3  . timolol (BETIMOL) 0.5 % ophthalmic solution 1 drop as directed.      . valsartan-hydrochlorothiazide (DIOVAN-HCT) 160-25 MG tablet Take 1 tablet by mouth daily. 90 tablet 3   No current facility-administered medications for this visit.    Past Medical History  Diagnosis Date  . Hyperlipidemia 03/17/1999  . Hypertension 03/17/1999  . GERD (gastroesophageal reflux disease) 09/16/1996  . CAD (coronary artery disease) 07/17/2004  . Diverticulosis of colon 12/15/2005  . Glaucoma   . History of tobacco abuse     Quit 2000  . Esophageal stricture   . Gastritis   . Stress fracture of foot     right midfoot, per Dr. Canary Brim 2012  . Hx of cardiovascular stress test     ETT-Myoview (01/2014):  1.5 mm ST depression  in inf leads at peak exercise; no ischemia or scar, EF 57%, Low Risk    Past Surgical History  Procedure Laterality Date  . Esophagogastroduodenoscopy  10/08/2000    Stricture distal esoph, repeat dilation done 2012  . Esophagogastroduodenoscopy  10/08/2000    Stricture distal esoph  . 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 Roxy Manns with a LIMA to the LAD, RIMA to the distal right coronary artery, spahenous vein graft first diagonal, saphenous vein graft to the first circumflex marginal, sequential saphenous vein graft to the second  . Colonoscopy  12/30/2005    Divertics, mild, 10 yrs  . Colonoscopy  11/26/2006    Colitis biopsy neg    ROS:  As stated in the HPI and negative for all other systems.  PHYSICAL EXAM BP 134/68 mmHg  Pulse 64  Ht 5\' 9"  (1.753 m)  Wt 204 lb 4 oz (92.647 kg)  BMI 30.15 kg/m2 GENERAL:  Well appearing HEENT:  Pupils equal round and reactive, fundi not visualized, oral mucosa unremarkable NECK:  No jugular venous distention, waveform within normal limits, carotid upstroke brisk and symmetric, left bruit, no thyromegaly LYMPHATICS:  No cervical, inguinal adenopathy LUNGS:  Clear to auscultation bilaterally BACK:  No CVA tenderness CHEST:  Well healed  sternotomy scar. HEART:  PMI not displaced or sustained,S1 and S2 within normal limits, no S3, no S4, no clicks, no rubs, no murmurs ABD:  Flat, positive bowel sounds normal in frequency in pitch, no bruits, no rebound, no guarding, no midline pulsatile mass, no hepatomegaly, no splenomegaly EXT:  2 plus pulses throughout, trace edema, no cyanosis no clubbing SKIN:  Dry skin with an ulcer in the right pretibial.    EKG:  Sinus rhythm, rate 64  axis within normal limits, intervals within normal limits, no acute ST-T wave changes. 11/24/2015   ASSESSMENT AND PLAN  CAD:  He is having no new symptoms since his stress perfusion study that would suggest new onset angina.  He will continue with risk  reduction.   HTN:  The blood pressure is at target. No change in medications is indicated. We will continue with therapeutic lifestyle changes (TLC).  CAROTID STENOSIS:  He has moderate stenosis was 0-39% right and 40-59% left. I reviewed the 2015 study.  I will order a repeat study.   CHOLESTEROL:  His HDL was 69.6 and his LDL 50. He will continue the meds as listed.  This was checked in Nov 2016  WEIGHT:  The patient understands the need to lose weight with diet and exercise. We have discussed specific strategies for this.  His weight has gone up.

## 2015-11-24 ENCOUNTER — Ambulatory Visit (INDEPENDENT_AMBULATORY_CARE_PROVIDER_SITE_OTHER): Payer: Commercial Managed Care - HMO | Admitting: Cardiology

## 2015-11-24 ENCOUNTER — Encounter: Payer: Self-pay | Admitting: Cardiology

## 2015-11-24 VITALS — BP 134/68 | HR 64 | Ht 69.0 in | Wt 204.2 lb

## 2015-11-24 DIAGNOSIS — I6523 Occlusion and stenosis of bilateral carotid arteries: Secondary | ICD-10-CM | POA: Diagnosis not present

## 2015-11-24 NOTE — Patient Instructions (Signed)
Dr Percival Spanish recommends that you continue on your current medications as directed. Please refer to the Current Medication list given to you today.  Your physician has requested that you have a carotid duplex. This test is an ultrasound of the carotid arteries in your neck. It looks at blood flow through these arteries that supply the brain with blood. Allow one hour for this exam. There are no restrictions or special instructions.  Dr Percival Spanish recommends that you schedule a follow-up appointment in 1 year. You will receive a reminder letter in the mail two months in advance. If you don't receive a letter, please call our office to schedule the follow-up appointment.  If you need a refill on your cardiac medications before your next appointment, please call your pharmacy.

## 2015-11-29 ENCOUNTER — Ambulatory Visit (HOSPITAL_COMMUNITY)
Admission: RE | Admit: 2015-11-29 | Discharge: 2015-11-29 | Disposition: A | Discharge status: Home / Self Care | Payer: Commercial Managed Care - HMO | Source: Ambulatory Visit | Attending: Cardiology | Admitting: Cardiology

## 2015-11-29 DIAGNOSIS — Z87891 Personal history of nicotine dependence: Secondary | ICD-10-CM | POA: Insufficient documentation

## 2015-11-29 DIAGNOSIS — I6523 Occlusion and stenosis of bilateral carotid arteries: Secondary | ICD-10-CM | POA: Insufficient documentation

## 2015-11-29 DIAGNOSIS — E785 Hyperlipidemia, unspecified: Secondary | ICD-10-CM | POA: Diagnosis not present

## 2015-11-29 DIAGNOSIS — I10 Essential (primary) hypertension: Secondary | ICD-10-CM | POA: Diagnosis not present

## 2015-11-30 ENCOUNTER — Other Ambulatory Visit: Payer: Self-pay | Admitting: *Deleted

## 2015-11-30 DIAGNOSIS — I739 Peripheral vascular disease, unspecified: Principal | ICD-10-CM

## 2015-11-30 DIAGNOSIS — I779 Disorder of arteries and arterioles, unspecified: Secondary | ICD-10-CM

## 2015-11-30 NOTE — Addendum Note (Signed)
Addended by: Therisa Doyne on: 11/30/2015 04:45 PM   Modules accepted: Orders

## 2016-01-15 ENCOUNTER — Other Ambulatory Visit: Payer: Self-pay | Admitting: Cardiology

## 2016-02-01 DIAGNOSIS — D225 Melanocytic nevi of trunk: Secondary | ICD-10-CM | POA: Diagnosis not present

## 2016-02-01 DIAGNOSIS — D1801 Hemangioma of skin and subcutaneous tissue: Secondary | ICD-10-CM | POA: Diagnosis not present

## 2016-02-01 DIAGNOSIS — L309 Dermatitis, unspecified: Secondary | ICD-10-CM | POA: Diagnosis not present

## 2016-02-01 DIAGNOSIS — L821 Other seborrheic keratosis: Secondary | ICD-10-CM | POA: Diagnosis not present

## 2016-02-01 DIAGNOSIS — L570 Actinic keratosis: Secondary | ICD-10-CM | POA: Diagnosis not present

## 2016-02-01 DIAGNOSIS — L812 Freckles: Secondary | ICD-10-CM | POA: Diagnosis not present

## 2016-04-04 DIAGNOSIS — H43813 Vitreous degeneration, bilateral: Secondary | ICD-10-CM | POA: Diagnosis not present

## 2016-04-04 DIAGNOSIS — H353131 Nonexudative age-related macular degeneration, bilateral, early dry stage: Secondary | ICD-10-CM | POA: Diagnosis not present

## 2016-04-04 DIAGNOSIS — H35363 Drusen (degenerative) of macula, bilateral: Secondary | ICD-10-CM | POA: Diagnosis not present

## 2016-04-04 DIAGNOSIS — H4010X1 Unspecified open-angle glaucoma, mild stage: Secondary | ICD-10-CM | POA: Diagnosis not present

## 2016-04-08 DIAGNOSIS — H40013 Open angle with borderline findings, low risk, bilateral: Secondary | ICD-10-CM | POA: Diagnosis not present

## 2016-04-08 DIAGNOSIS — H40033 Anatomical narrow angle, bilateral: Secondary | ICD-10-CM | POA: Diagnosis not present

## 2016-04-08 DIAGNOSIS — H40053 Ocular hypertension, bilateral: Secondary | ICD-10-CM | POA: Diagnosis not present

## 2016-04-08 DIAGNOSIS — H01003 Unspecified blepharitis right eye, unspecified eyelid: Secondary | ICD-10-CM | POA: Diagnosis not present

## 2016-04-15 DIAGNOSIS — H40013 Open angle with borderline findings, low risk, bilateral: Secondary | ICD-10-CM | POA: Diagnosis not present

## 2016-04-15 DIAGNOSIS — H40053 Ocular hypertension, bilateral: Secondary | ICD-10-CM | POA: Diagnosis not present

## 2016-04-29 DIAGNOSIS — H40051 Ocular hypertension, right eye: Secondary | ICD-10-CM | POA: Diagnosis not present

## 2016-04-29 DIAGNOSIS — H40011 Open angle with borderline findings, low risk, right eye: Secondary | ICD-10-CM | POA: Diagnosis not present

## 2016-05-24 ENCOUNTER — Ambulatory Visit (INDEPENDENT_AMBULATORY_CARE_PROVIDER_SITE_OTHER): Payer: Commercial Managed Care - HMO

## 2016-05-24 DIAGNOSIS — Z23 Encounter for immunization: Secondary | ICD-10-CM | POA: Diagnosis not present

## 2016-06-10 DIAGNOSIS — H5310 Unspecified subjective visual disturbances: Secondary | ICD-10-CM | POA: Diagnosis not present

## 2016-06-10 DIAGNOSIS — H40013 Open angle with borderline findings, low risk, bilateral: Secondary | ICD-10-CM | POA: Diagnosis not present

## 2016-06-10 DIAGNOSIS — H35059 Retinal neovascularization, unspecified, unspecified eye: Secondary | ICD-10-CM | POA: Diagnosis not present

## 2016-06-10 DIAGNOSIS — H40033 Anatomical narrow angle, bilateral: Secondary | ICD-10-CM | POA: Diagnosis not present

## 2016-06-10 DIAGNOSIS — H43391 Other vitreous opacities, right eye: Secondary | ICD-10-CM | POA: Diagnosis not present

## 2016-07-10 DIAGNOSIS — H35051 Retinal neovascularization, unspecified, right eye: Secondary | ICD-10-CM | POA: Diagnosis not present

## 2016-07-10 DIAGNOSIS — H353211 Exudative age-related macular degeneration, right eye, with active choroidal neovascularization: Secondary | ICD-10-CM | POA: Diagnosis not present

## 2016-07-10 DIAGNOSIS — H3561 Retinal hemorrhage, right eye: Secondary | ICD-10-CM | POA: Diagnosis not present

## 2016-07-10 DIAGNOSIS — H4010X1 Unspecified open-angle glaucoma, mild stage: Secondary | ICD-10-CM | POA: Diagnosis not present

## 2016-07-15 DIAGNOSIS — H353211 Exudative age-related macular degeneration, right eye, with active choroidal neovascularization: Secondary | ICD-10-CM | POA: Diagnosis not present

## 2016-07-16 ENCOUNTER — Other Ambulatory Visit: Payer: Self-pay | Admitting: Family Medicine

## 2016-07-24 ENCOUNTER — Ambulatory Visit (INDEPENDENT_AMBULATORY_CARE_PROVIDER_SITE_OTHER): Payer: Commercial Managed Care - HMO | Admitting: Podiatry

## 2016-07-24 ENCOUNTER — Encounter: Payer: Self-pay | Admitting: Podiatry

## 2016-07-24 ENCOUNTER — Ambulatory Visit (INDEPENDENT_AMBULATORY_CARE_PROVIDER_SITE_OTHER): Payer: Commercial Managed Care - HMO

## 2016-07-24 VITALS — BP 135/78

## 2016-07-24 DIAGNOSIS — M1000 Idiopathic gout, unspecified site: Secondary | ICD-10-CM | POA: Diagnosis not present

## 2016-07-24 DIAGNOSIS — M779 Enthesopathy, unspecified: Secondary | ICD-10-CM

## 2016-07-24 DIAGNOSIS — M79671 Pain in right foot: Secondary | ICD-10-CM

## 2016-07-24 MED ORDER — TRIAMCINOLONE ACETONIDE 10 MG/ML IJ SUSP
10.0000 mg | Freq: Once | INTRAMUSCULAR | Status: AC
  Administered 2016-07-24: 10 mg

## 2016-07-24 NOTE — Progress Notes (Signed)
Subjective:     Patient ID: Alan [name], male   DOB: [birth year], 70 y.o.   MRN: [id number]  HPI patient presents stating he's getting a lot of pain in his right first MPJ which just started back and he has not had this for 2-1/2 years. 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 Since It's Been 2-1/2 Years We Will Just Watch This and See How It Progress  X-ray report indicates that there is some irritation around the first MPJ but no indications of structural malalignment

## 2016-07-24 NOTE — Patient Instructions (Signed)

## 2016-08-18 ENCOUNTER — Other Ambulatory Visit: Payer: Self-pay | Admitting: Family Medicine

## 2016-08-18 ENCOUNTER — Encounter: Payer: Self-pay | Admitting: Family Medicine

## 2016-08-18 DIAGNOSIS — I10 Essential (primary) hypertension: Secondary | ICD-10-CM

## 2016-08-21 ENCOUNTER — Other Ambulatory Visit: Payer: Self-pay

## 2016-08-21 ENCOUNTER — Other Ambulatory Visit: Payer: Commercial Managed Care - HMO

## 2016-08-21 ENCOUNTER — Ambulatory Visit (INDEPENDENT_AMBULATORY_CARE_PROVIDER_SITE_OTHER): Payer: Commercial Managed Care - HMO

## 2016-08-21 VITALS — BP 130/76 | HR 70 | Temp 98.2°F | Ht 67.0 in | Wt 192.5 lb

## 2016-08-21 DIAGNOSIS — Z0000 Encounter for general adult medical examination without abnormal findings: Secondary | ICD-10-CM | POA: Diagnosis not present

## 2016-08-21 DIAGNOSIS — Z125 Encounter for screening for malignant neoplasm of prostate: Secondary | ICD-10-CM

## 2016-08-21 DIAGNOSIS — I10 Essential (primary) hypertension: Secondary | ICD-10-CM

## 2016-08-21 LAB — PSA, MEDICARE: PSA: 0.47 ng/mL (ref 0.10–4.00)

## 2016-08-21 LAB — LIPID PANEL
CHOLESTEROL: 178 mg/dL (ref 0–200)
HDL: 65.5 mg/dL (ref >39.00)
LDL CALC: 83 mg/dL (ref 0–99)
NonHDL: 112.15
Total CHOL/HDL Ratio: 3
Triglycerides: 147 mg/dL (ref 0.0–149.0)
VLDL: 29.4 mg/dL (ref 0.0–40.0)

## 2016-08-21 LAB — COMPREHENSIVE METABOLIC PANEL
ALBUMIN: 4.2 g/dL (ref 3.5–5.2)
ALT: 24 U/L (ref 0–53)
AST: 14 U/L (ref 0–37)
Alkaline Phosphatase: 70 U/L (ref 39–117)
BILIRUBIN TOTAL: 0.6 mg/dL (ref 0.2–1.2)
BUN: 18 mg/dL (ref 6–23)
CALCIUM: 9.9 mg/dL (ref 8.4–10.5)
CHLORIDE: 98 meq/L (ref 96–112)
CO2: 32 meq/L (ref 19–32)
CREATININE: 1.16 mg/dL (ref 0.40–1.50)
GFR: 66.12 mL/min (ref >60.00)
Glucose, Bld: 117 mg/dL — ABNORMAL HIGH (ref 70–99)
Potassium: 4.2 mEq/L (ref 3.5–5.1)
SODIUM: 137 meq/L (ref 135–145)
Total Protein: 7.5 g/dL (ref 6.0–8.3)

## 2016-08-21 MED ORDER — SILDENAFIL CITRATE 20 MG PO TABS: 100.0000 mg | ORAL_TABLET | Freq: Every day | ORAL | 12 refills | Status: DC | PRN

## 2016-08-21 NOTE — Telephone Encounter (Signed)
Pt was here today for AWV and requested refill of Sildenafil.

## 2016-08-21 NOTE — Progress Notes (Signed)
Subjective:   Alan [name] is a 70 y.o. male who presents for Medicare Annual/Subsequent preventive examination.  Review of Systems:  N/A Cardiac Risk Factors include: advanced age (>64men, >46 women);male gender;dyslipidemia;hypertension;obesity (BMI >30kg/m2)     Objective:    Vitals: BP 130/76 (BP Location: Left Arm, Patient Position: Sitting, Cuff Size: Normal)   Pulse 70   Temp 98.2 F (36.8 C) (Oral)   Ht 5\' 7"  (1.702 m) Comment: no shoes  Wt 192 lb 8 oz (87.3 kg)   SpO2 98%   BMI 30.15 kg/m   Body mass index is 30.15 kg/m.  Tobacco History  Smoking Status  . Former Smoker  . Types: Cigarettes  . Quit date: 09/16/1998  Smokeless Tobacco  . Never Used     Counseling given: No   Past Medical History:  Diagnosis Date  . CAD (coronary artery disease) 07/17/2004  . Diverticulosis of colon 12/15/2005  . Esophageal stricture   . Gastritis   . GERD (gastroesophageal reflux disease) 09/16/1996  . Glaucoma   . History of tobacco abuse    Quit 2000  . Hx of cardiovascular stress test    ETT-Myoview (01/2014):  1.5 mm ST depression in inf leads at peak exercise; no ischemia or scar, EF 57%, Low Risk  . Hyperlipidemia 03/17/1999  . Hypertension 03/17/1999  . Stress fracture of foot    right midfoot, per Dr. Canary Brim 2012   Past Surgical History:  Procedure Laterality Date  . COLONOSCOPY  12/30/2005   Divertics, mild, 10 yrs  . COLONOSCOPY  11/26/2006   Colitis biopsy neg  . CORONARY ARTERY BYPASS GRAFT  07/27/2004   By Dr. Roxy Manns with a LIMA to the LAD, RIMA to the distal right coronary artery, spahenous vein graft first diagonal, saphenous vein graft to the first circumflex marginal, sequential saphenous vein graft to the second  . ESOPHAGOGASTRODUODENOSCOPY  10/08/2000   Stricture distal esoph, repeat dilation done 2012  . ESOPHAGOGASTRODUODENOSCOPY  10/08/2000   Stricture distal esoph   Family History  Problem Relation Age of Onset  . Peripheral vascular disease  Mother   . Stroke Father     x2  . Hypertension Brother   . Heart attack Brother     MI  . Colon polyps Brother   . Heart disease Brother     MI  . Hypertension Sister   . Breast cancer Sister   . Cancer Sister     Breast CA  . Hypertension Sister   . Hypertension Sister   . Colon polyps Other   . Diabetes Neg Hx   . Depression Neg Hx   . Alcohol abuse Neg Hx   . Drug abuse Neg Hx   . Prostate cancer Neg Hx   . Colon cancer Neg Hx    History  Sexual Activity  . Sexual activity: Not on file    Outpatient Encounter Prescriptions as of 08/21/2016  Medication Sig  . amLODipine (NORVASC) 10 MG tablet Take 1 tablet (10 mg total) by mouth daily.  Marland Kitchen aspirin 81 MG tablet Take 81 mg by mouth daily.    Marland Kitchen atorvastatin (LIPITOR) 80 MG tablet Take 0.5 tablets (40 mg total) by mouth daily.  . [facility]S IBUPROFEN IB 200 MG tablet As needed  . Loratadine (CLARITIN PO) Take 1 tablet by mouth daily as needed. Take 1 tab by mouth daily as needed  . metoprolol (LOPRESSOR) 100 MG tablet TAKE 1 TABLET TWICE A DAY  . RaNITidine HCl (RANITIDINE 75  PO) Take 1 tablet by mouth daily as needed. Take 1 tab by mouth daily as needed  . sildenafil (REVATIO) 20 MG tablet Take 5 tablets (100 mg total) by mouth daily as needed.  Marland Kitchen spironolactone (ALDACTONE) 25 MG tablet Take 1 tablet (25 mg total) by mouth daily.  . timolol (BETIMOL) 0.5 % ophthalmic solution 1 drop as directed.    . valsartan-hydrochlorothiazide (DIOVAN-HCT) 160-25 MG tablet TAKE 1 TABLET EVERY DAY   No facility-administered encounter medications on file as of 08/21/2016.     Activities of Daily Living In your present state of health, do you have any difficulty performing the following activities: 08/21/2016  Hearing? N  Vision? Y  Difficulty concentrating or making decisions? N  Walking or climbing stairs? N  Dressing or bathing? N  Doing errands, shopping? N  Preparing Food and eating ? N  Using the Toilet? N  In the past six months,  have you accidently leaked urine? N  Do you have problems with loss of bowel control? N  Managing your Medications? N  Managing your Finances? N  Housekeeping or managing your Housekeeping? N  Some recent data might be hidden    Patient Care Team: Tonia Ghent, MD as PCP - General (Family Medicine) Hurman Horn, MD as Consulting Physician (Ophthalmology) Druscilla Brownie, MD as Consulting Physician (Dermatology) Hortencia Pilar, MD as Consulting Physician (Surgery) Wallene Huh, DPM as Consulting Physician (Podiatry) Minus Breeding, MD as Consulting Physician (Cardiology)   Assessment:      Hearing Screening   125Hz  250Hz  500Hz  1000Hz  2000Hz  3000Hz  4000Hz  6000Hz  8000Hz   Right ear:   40 40 40  40    Left ear:   40 40 40  0    Vision Screening Comments: Last vision exam with Dr. Zadie Rhine in 2017    Exercise Activities and Dietary recommendations Current Exercise Habits: Home exercise routine, Type of exercise: strength training/weights, Time (Minutes): > 60, Frequency (Times/Week): 3, Weekly Exercise (Minutes/Week): 0, Intensity: Moderate, Exercise limited by: None identified  Goals    . Increase physical activity          Starting 08/21/2016, I will continue to exercise at least 90 min 3 days per week.       Fall Risk Fall Risk  08/21/2016 08/21/2015 08/18/2014 08/06/2013  Falls in the past year? No No No No   Depression Screen PHQ 2/9 Scores 08/21/2016 08/21/2015 08/18/2014 08/06/2013  PHQ - 2 Score 0 0 0 0    Cognitive Function MMSE - Mini Mental State Exam 08/21/2016  Orientation to time 5  Orientation to Place 5  Registration 3  Attention/ Calculation 0  Recall 3  Language- name 2 objects 0  Language- repeat 1  Language- follow 3 step command 3  Language- read & follow direction 0  Write a sentence 0  Copy design 0  Total score 20     PLEASE NOTE: A Mini-Cog screen was completed. Maximum score is 20. A value of 0 denotes this part of Folstein MMSE was  not completed or the patient failed this part of the Mini-Cog screening.   Mini-Cog Screening Orientation to Time - Max 5 pts Orientation to Place - Max 5 pts Registration - Max 3 pts Recall - Max 3 pts Language Repeat - Max 1 pts Language Follow 3 Step Command - Max 3 pts     Immunization History  Administered Date(s) Administered  . Influenza Whole 06/16/2006, 06/12/2009, 06/15/2010  . Influenza,inj,Quad PF,36+ Mos  06/11/2013, 06/15/2014, 05/31/2015, 05/24/2016  . Pneumococcal Conjugate-13 08/18/2014  . Pneumococcal Polysaccharide-23 07/23/2011  . Td 03/21/1999, 07/13/2009  . Zoster 09/17/2011   Screening Tests Health Maintenance  Topic Date Due  . COLONOSCOPY  11/25/2016  . TETANUS/TDAP  07/14/2019  . INFLUENZA VACCINE  Addressed  . ZOSTAVAX  Completed  . Hepatitis C Screening  Completed  . PNA vac Low Risk Adult  Completed      Plan:     I have personally reviewed and addressed the Medicare Annual Wellness questionnaire and have noted the following in the patient's chart:  A. Medical and social history B. Use of alcohol, tobacco or illicit drugs  C. Current medications and supplements D. Functional ability and status E.  Nutritional status F.  Physical activity G. Advance directives H. List of other physicians I.  Hospitalizations, surgeries, and ER visits in previous 12 months J.  [place] to include hearing, vision, cognitive, depression L. Referrals and appointments - none  In addition, I have reviewed and discussed with patient certain preventive protocols, quality metrics, and best practice recommendations. A written personalized care plan for preventive services as well as general preventive health recommendations were provided to patient.  See attached scanned questionnaire for additional information.   Signed,   Lindell Noe, MHA, BS, LPN Health Coach

## 2016-08-21 NOTE — Progress Notes (Signed)
PCP notes:   Health maintenance:  No gaps identified. Health maintenance schedule reviewed with patient.   Abnormal screenings:   Hearing - failed  Patient concerns:   Pt needs refill for Sildenafil. Request submitted to PCP.   Nurse concerns:  None  Next PCP appt:   08/27/16 @ 0945

## 2016-08-21 NOTE — Progress Notes (Signed)
I reviewed health advisor's note, was available for consultation, and agree with documentation and plan.  

## 2016-08-21 NOTE — Progress Notes (Signed)
Pre visit review using our clinic review tool, if applicable. No additional management support is needed unless otherwise documented below in the visit note. 

## 2016-08-21 NOTE — Telephone Encounter (Signed)
Sent. Thanks.   

## 2016-08-21 NOTE — Patient Instructions (Signed)
Alan [name] , Thank you for taking time to come for your Medicare Wellness Visit. I appreciate your ongoing commitment to your health goals. Please review the following plan we discussed and let me know if I can assist you in the future.   These are the goals we discussed: Goals    . Increase physical activity          Starting 08/21/2016, I will continue to exercise at least 90 min 3 days per week.        This is a list of the screening recommended for you and due dates:  Health Maintenance  Topic Date Due  . Colon Cancer Screening  11/25/2016  . Tetanus Vaccine  07/14/2019  . Flu Shot  Addressed  . Shingles Vaccine  Completed  .  Hepatitis C: One time screening is recommended by Center for Disease Control  (CDC) for  adults born from [birth year] through 1965.   Completed  . Pneumonia vaccines  Completed   Preventive Care for Adults  A healthy lifestyle and preventive care can promote health and wellness. Preventive health guidelines for adults include the following key practices.  . A routine yearly physical is a good way to check with your health care provider about your health and preventive screening. It is a chance to share any concerns and updates on your health and to receive a thorough exam.  . Visit your dentist for a routine exam and preventive care every 6 months. Brush your teeth twice a day and floss once a day. Good oral hygiene prevents tooth decay and gum disease.  . The frequency of eye exams is based on your age, health, family medical history, use  of contact lenses, and other factors. Follow your health care provider's ecommendations for frequency of eye exams.  . Eat a healthy diet. Foods like vegetables, fruits, whole grains, low-fat dairy products, and lean protein foods contain the nutrients you need without too many calories. Decrease your intake of foods high in solid fats, added sugars, and salt. Eat the right amount of calories for you. Get information about a  proper diet from your health care provider, if necessary.  . Regular physical exercise is one of the most important things you can do for your health. Most adults should get at least 150 minutes of moderate-intensity exercise (any activity that increases your heart rate and causes you to sweat) each week. In addition, most adults need muscle-strengthening exercises on 2 or more days a week.  Silver Sneakers may be a benefit available to you. To determine eligibility, you may visit the website: www.silversneakers.com or contact program at (920)708-9443 Mon-Fri between 8AM-8PM.   . Maintain a healthy weight. The body mass index (BMI) is a screening tool to identify possible weight problems. It provides an estimate of body fat based on height and weight. Your health care provider can find your BMI and can help you achieve or maintain a healthy weight.   For adults 20 years and older: ? A BMI below 18.5 is considered underweight. ? A BMI of 18.5 to 24.9 is normal. ? A BMI of 25 to 29.9 is considered overweight. ? A BMI of 30 and above is considered obese.   . Maintain normal blood lipids and cholesterol levels by exercising and minimizing your intake of saturated fat. Eat a balanced diet with plenty of fruit and vegetables. Blood tests for lipids and cholesterol should begin at age 5 and be repeated every 5 years. If your  lipid or cholesterol levels are high, you are over 50, or you are at high risk for heart disease, you may need your cholesterol levels checked more frequently. Ongoing high lipid and cholesterol levels should be treated with medicines if diet and exercise are not working.  . If you smoke, find out from your health care provider how to quit. If you do not use tobacco, please do not start.  . If you choose to drink alcohol, please do not consume more than 2 drinks per day. One drink is considered to be 12 ounces (355 mL) of beer, 5 ounces (148 mL) of wine, or 1.5 ounces (44 mL) of  liquor.  . If you are 59-51 years old, ask your health care provider if you should take aspirin to prevent strokes.  . Use sunscreen. Apply sunscreen liberally and repeatedly throughout the day. You should seek shade when your shadow is shorter than you. Protect yourself by wearing long sleeves, pants, a wide-brimmed hat, and sunglasses year round, whenever you are outdoors.  . Once a month, do a whole body skin exam, using a mirror to look at the skin on your back. Tell your health care provider of new moles, moles that have irregular borders, moles that are larger than a pencil eraser, or moles that have changed in shape or color.

## 2016-08-23 ENCOUNTER — Telehealth: Payer: Self-pay | Admitting: *Deleted

## 2016-08-23 NOTE — Telephone Encounter (Signed)
PA for Sildenafil sent thru [facility].  Denial letter received and scanned into EMR.  Patient advised.

## 2016-08-23 NOTE — Telephone Encounter (Signed)
Patient advised that PA for Sildenafil was denied.

## 2016-08-26 DIAGNOSIS — H353211 Exudative age-related macular degeneration, right eye, with active choroidal neovascularization: Secondary | ICD-10-CM | POA: Diagnosis not present

## 2016-08-27 ENCOUNTER — Encounter: Payer: Self-pay | Admitting: Family Medicine

## 2016-08-27 ENCOUNTER — Ambulatory Visit (INDEPENDENT_AMBULATORY_CARE_PROVIDER_SITE_OTHER): Payer: Commercial Managed Care - HMO | Admitting: Family Medicine

## 2016-08-27 ENCOUNTER — Other Ambulatory Visit: Payer: Self-pay | Admitting: *Deleted

## 2016-08-27 VITALS — BP 122/70 | HR 68 | Temp 97.8°F | Ht 67.0 in | Wt 195.0 lb

## 2016-08-27 DIAGNOSIS — R7309 Other abnormal glucose: Secondary | ICD-10-CM

## 2016-08-27 DIAGNOSIS — I10 Essential (primary) hypertension: Secondary | ICD-10-CM

## 2016-08-27 DIAGNOSIS — Z0000 Encounter for general adult medical examination without abnormal findings: Secondary | ICD-10-CM

## 2016-08-27 DIAGNOSIS — E785 Hyperlipidemia, unspecified: Secondary | ICD-10-CM | POA: Diagnosis not present

## 2016-08-27 MED ORDER — ATORVASTATIN CALCIUM 80 MG PO TABS: 40.0000 mg | ORAL_TABLET | Freq: Every day | ORAL | 3 refills | Status: DC

## 2016-08-27 MED ORDER — SPIRONOLACTONE 25 MG PO TABS: 25.0000 mg | ORAL_TABLET | Freq: Every day | ORAL | 3 refills | Status: DC

## 2016-08-27 MED ORDER — AMLODIPINE BESYLATE 10 MG PO TABS: 10.0000 mg | ORAL_TABLET | Freq: Every day | ORAL | 3 refills | Status: DC

## 2016-08-27 MED ORDER — VALSARTAN-HYDROCHLOROTHIAZIDE 160-25 MG PO TABS: 1.0000 | ORAL_TABLET | Freq: Every day | ORAL | 3 refills | Status: DC

## 2016-08-27 MED ORDER — SILDENAFIL CITRATE 20 MG PO TABS: 100.0000 mg | ORAL_TABLET | Freq: Every day | ORAL | 12 refills | Status: DC | PRN

## 2016-08-27 NOTE — Progress Notes (Signed)
Hypertension:    Using medication without problems or lightheadedness: yes Chest pain with exertion:no Edema:no Short of breath:no  Elevated Cholesterol: Using medications without problems:yes Muscle aches: some occ B hamstring pain in the last few weeks.  occ with some back or foot aches.   Diet compliance:yes Exercise:yes  Carotid stenosis prev noted, with surveillance planned.    Hearing - failed.  Declined hearing aids.   PSA wnl.  D/w pt.   Advance directive- wife designated if patient were incapacitated.  Diet and exercise d/w pt.    He has had eye injections done, most recently yesterday.  His vision is improved, per his report.    Hyperglycemia.  D/w pt about diet changes, has been exercising already.  See AVS.  He has been eating more candy in the meantime.    Meds, vitals, and allergies reviewed.   PMH and SH reviewed  ROS: Per HPI unless specifically indicated in ROS section   GEN: nad, alert and oriented HEENT: mucous membranes moist NECK: supple w/o LA CV: rrr. PULM: ctab, no inc wob ABD: soft, +bs EXT: no edema SKIN: no acute rash

## 2016-08-27 NOTE — Progress Notes (Signed)
Pre visit review using our clinic review tool, if applicable. No additional management support is needed unless otherwise documented below in the visit note. 

## 2016-08-27 NOTE — Patient Instructions (Addendum)
Don't change your meds for now.   If you have more aches, then stop the lipitor for a few days and see if that helps.  Update me at that point.   Less bacon and candy in the meantime.   Take care.  Glad to see you.

## 2016-08-28 DIAGNOSIS — Z0000 Encounter for general adult medical examination without abnormal findings: Secondary | ICD-10-CM | POA: Insufficient documentation

## 2016-08-28 NOTE — Assessment & Plan Note (Addendum)
Discussed with patient about diet and exercise. Low-carb diet discussed with patient. Labs discussed with patient. Recheck periodically. He agrees. >25 minutes spent in face to face time with patient, >50% spent in counselling or coordination of care.

## 2016-08-28 NOTE — Assessment & Plan Note (Signed)
Controlled. No change in medications. Continue as is. Labs discussed with patient. He agrees.

## 2016-08-28 NOTE — Assessment & Plan Note (Signed)
Hearing - failed.  Declined hearing aids.   PSA wnl.  D/w pt.   Advance directive- wife designated if patient were incapacitated.   Diet and exercise d/w pt.

## 2016-10-07 DIAGNOSIS — H353211 Exudative age-related macular degeneration, right eye, with active choroidal neovascularization: Secondary | ICD-10-CM | POA: Diagnosis not present

## 2016-10-07 DIAGNOSIS — H353131 Nonexudative age-related macular degeneration, bilateral, early dry stage: Secondary | ICD-10-CM | POA: Diagnosis not present

## 2016-11-18 DIAGNOSIS — H40053 Ocular hypertension, bilateral: Secondary | ICD-10-CM | POA: Diagnosis not present

## 2016-11-18 DIAGNOSIS — H40033 Anatomical narrow angle, bilateral: Secondary | ICD-10-CM | POA: Diagnosis not present

## 2016-11-18 DIAGNOSIS — H5310 Unspecified subjective visual disturbances: Secondary | ICD-10-CM | POA: Diagnosis not present

## 2016-11-18 DIAGNOSIS — H40013 Open angle with borderline findings, low risk, bilateral: Secondary | ICD-10-CM | POA: Diagnosis not present

## 2016-11-25 DIAGNOSIS — H353211 Exudative age-related macular degeneration, right eye, with active choroidal neovascularization: Secondary | ICD-10-CM | POA: Diagnosis not present

## 2016-12-01 NOTE — Progress Notes (Signed)
HPI The patient presents for yearly follow up.  Since I last saw him he has has had issues with his back hurting him in his foot. He did have some vision problems and all of these things up and evaluated. He's not had any cardiac issues per se. He's actually back to doing some exercising. He might get some mild leg soreness after he pedals a bicycle but not while he is traveling the bicycle. He denies any chest pressure, neck or arm discomfort. He's had no palpitations, presyncope or syncope. He's had no PND or orthopnea.   No Known Allergies  Current Outpatient Prescriptions  Medication Sig Dispense Refill  . amLODipine (NORVASC) 10 MG tablet Take 1 tablet (10 mg total) by mouth daily. 90 tablet 3  . aspirin 81 MG tablet Take 81 mg by mouth daily.      Marland Kitchen atorvastatin (LIPITOR) 80 MG tablet Take 0.5 tablets (40 mg total) by mouth daily. 45 tablet 3  . [facility] IBUPROFEN IB 200 MG tablet As needed    . Loratadine (CLARITIN PO) Take 1 tablet by mouth daily as needed. Take 1 tab by mouth daily as needed    . metoprolol (LOPRESSOR) 100 MG tablet TAKE 1 TABLET TWICE A DAY 180 tablet 3  . RaNITidine HCl (RANITIDINE 75 PO) Take 1 tablet by mouth daily as needed. Take 1 tab by mouth daily as needed    . sildenafil (REVATIO) 20 MG tablet Take 5 tablets (100 mg total) by mouth daily as needed. 50 tablet 12  . spironolactone (ALDACTONE) 25 MG tablet Take 1 tablet (25 mg total) by mouth daily. 90 tablet 3  . timolol (BETIMOL) 0.5 % ophthalmic solution 1 drop as directed.      . valsartan-hydrochlorothiazide (DIOVAN-HCT) 160-25 MG tablet Take 1 tablet by mouth daily. 90 tablet 3   No current facility-administered medications for this visit.     Past Medical History:  Diagnosis Date  . CAD (coronary artery disease) 07/17/2004  . Diverticulosis of colon 12/15/2005  . Esophageal stricture   . Gastritis   . GERD (gastroesophageal reflux disease) 09/16/1996  . Glaucoma   . History of tobacco abuse    Quit  2000  . Hx of cardiovascular stress test    ETT-Myoview (01/2014):  1.5 mm ST depression in inf leads at peak exercise; no ischemia or scar, EF 57%, Low Risk  . Hyperlipidemia 03/17/1999  . Hypertension 03/17/1999  . Stress fracture of foot    right midfoot, per Dr. Canary Brim 2012    Past Surgical History:  Procedure Laterality Date  . COLONOSCOPY  12/30/2005   Divertics, mild, 10 yrs  . COLONOSCOPY  11/26/2006   Colitis biopsy neg  . CORONARY ARTERY BYPASS GRAFT  07/27/2004   By Dr. Roxy Manns with a LIMA to the LAD, RIMA to the distal right coronary artery, spahenous vein graft first diagonal, saphenous vein graft to the first circumflex marginal, sequential saphenous vein graft to the second  . ESOPHAGOGASTRODUODENOSCOPY  10/08/2000   Stricture distal esoph, repeat dilation done 2012  . ESOPHAGOGASTRODUODENOSCOPY  10/08/2000   Stricture distal esoph    ROS:    As stated in the HPI and negative for all other systems.  PHYSICAL EXAM BP 122/64 (BP Location: Left Arm, Patient Position: Sitting, Cuff Size: Normal)   Pulse (!) 58   Ht 5\' 8"  (1.727 m)   Wt 200 lb (90.7 kg)   BMI 30.41 kg/m  GENERAL:  Well appearing NECK:  No jugular  venous distention, waveform within normal limits, carotid upstroke brisk and symmetric, left bruit, no thyromegaly LUNGS:  Clear to auscultation bilaterally BACK:  No CVA tenderness CHEST:  Well healed sternotomy scar. HEART:  PMI not displaced or sustained,S1 and S2 within normal limits, no S3, no S4, no clicks, no rubs, no murmurs ABD:  Flat, positive bowel sounds normal in frequency in pitch, no bruits, no rebound, no guarding, no midline pulsatile mass, no hepatomegaly, no splenomegaly EXT:  2 plus pulses throughout, trace edema, no cyanosis no clubbing SKIN:  Dry skin.     EKG:  Sinus rhythm, rate 58 axis within normal limits, intervals within normal limits, no acute ST-T wave changes. 12/02/2016  Lab Results  Component Value Date   CHOL 178 08/21/2016    TRIG 147.0 08/21/2016   HDL 65.50 08/21/2016   LDLCALC 83 08/21/2016   LDLDIRECT 69.6 07/28/2012    ASSESSMENT AND PLAN  CAD:   He is having no new symptoms since his stress perfusion study in 2015 that would suggest new onset angina.  He will continue with risk reduction.   HTN:    The blood pressure is at target. No change in medications is indicated. We will continue with therapeutic lifestyle changes (TLC).  CAROTID STENOSIS:    He has moderate stenosis was 0-39% right and 40-59% left. I reviewed the 2017 study.  I will order a repeat study for now.   CHOLESTEROL:  His HDL was 83 and his LDL 65.5. He will continue the meds as listed.  No change in therapy is planned.    WEIGHT:   The patient understands the need to lose weight with diet and exercise. We have discussed specific strategies for this.  His weight has gone up.  We discussed this again today.

## 2016-12-02 ENCOUNTER — Ambulatory Visit (INDEPENDENT_AMBULATORY_CARE_PROVIDER_SITE_OTHER): Payer: Medicare HMO | Admitting: Cardiology

## 2016-12-02 ENCOUNTER — Encounter: Payer: Self-pay | Admitting: Cardiology

## 2016-12-02 VITALS — BP 122/64 | HR 58 | Ht 68.0 in | Wt 200.0 lb

## 2016-12-02 DIAGNOSIS — E785 Hyperlipidemia, unspecified: Secondary | ICD-10-CM | POA: Diagnosis not present

## 2016-12-02 DIAGNOSIS — I10 Essential (primary) hypertension: Secondary | ICD-10-CM

## 2016-12-02 DIAGNOSIS — I2510 Atherosclerotic heart disease of native coronary artery without angina pectoris: Secondary | ICD-10-CM | POA: Diagnosis not present

## 2016-12-02 DIAGNOSIS — I779 Disorder of arteries and arterioles, unspecified: Secondary | ICD-10-CM | POA: Diagnosis not present

## 2016-12-02 DIAGNOSIS — I739 Peripheral vascular disease, unspecified: Secondary | ICD-10-CM

## 2016-12-02 NOTE — Patient Instructions (Signed)

## 2016-12-10 ENCOUNTER — Ambulatory Visit: Payer: Medicare HMO

## 2016-12-10 DIAGNOSIS — I779 Disorder of arteries and arterioles, unspecified: Secondary | ICD-10-CM

## 2016-12-10 DIAGNOSIS — I739 Peripheral vascular disease, unspecified: Principal | ICD-10-CM

## 2016-12-10 DIAGNOSIS — I6523 Occlusion and stenosis of bilateral carotid arteries: Secondary | ICD-10-CM | POA: Diagnosis not present

## 2016-12-10 LAB — VAS US CAROTID
LCCAPSYS: 94 cm/s
LEFT ECA DIAS: -15 cm/s
LEFT VERTEBRAL DIAS: 14 cm/s
Left CCA dist dias: -21 cm/s
Left CCA dist sys: -123 cm/s
Left CCA prox dias: 15 cm/s
Left ICA dist dias: -20 cm/s
Left ICA dist sys: -92 cm/s
Left ICA prox dias: 41 cm/s
Left ICA prox sys: 200 cm/s
RCCADSYS: -103 cm/s
RCCAPDIAS: 15 cm/s
RIGHT ECA DIAS: -1 cm/s
RIGHT VERTEBRAL DIAS: 9 cm/s
Right CCA prox sys: 111 cm/s

## 2016-12-13 ENCOUNTER — Telehealth: Payer: Self-pay | Admitting: *Deleted

## 2016-12-13 DIAGNOSIS — I6523 Occlusion and stenosis of bilateral carotid arteries: Secondary | ICD-10-CM

## 2016-12-13 NOTE — Telephone Encounter (Signed)
Carotid ordered place for 1 Year

## 2016-12-13 NOTE — Telephone Encounter (Signed)
-----   Message from Minus Breeding, MD sent at 12/10/2016  9:34 PM EDT ----- Stable 1-39% RICA stenosis. 46-21% LICA stenosis, with mildly increased velocities compared to previous exam. Follow up in one year.   Call Mr. Simerly with the results and send results to Elsie Stain, MD

## 2016-12-25 ENCOUNTER — Ambulatory Visit (INDEPENDENT_AMBULATORY_CARE_PROVIDER_SITE_OTHER): Payer: Medicare HMO | Admitting: Family Medicine

## 2016-12-25 ENCOUNTER — Encounter: Payer: Self-pay | Admitting: Family Medicine

## 2016-12-25 ENCOUNTER — Ambulatory Visit (INDEPENDENT_AMBULATORY_CARE_PROVIDER_SITE_OTHER)
Admission: RE | Admit: 2016-12-25 | Discharge: 2016-12-25 | Disposition: A | Discharge status: Home / Self Care | Payer: Medicare HMO | Source: Ambulatory Visit | Attending: Family Medicine | Admitting: Family Medicine

## 2016-12-25 VITALS — BP 120/70 | HR 60 | Temp 97.6°F | Wt 197.8 lb

## 2016-12-25 DIAGNOSIS — M25511 Pain in right shoulder: Secondary | ICD-10-CM

## 2016-12-25 NOTE — Progress Notes (Signed)
Pre visit review using our clinic review tool, if applicable. No additional management support is needed unless otherwise documented below in the visit note. 

## 2016-12-25 NOTE — Progress Notes (Signed)
R arm pain.  Going on for about 2 weeks.  Sore feeling.  He was working on Psychologist, sport and exercise, trying get the mower deck back on and attached.  He had to reach awkwardly at the time.  Didn't hit his arm/shoulder.  He started having more pain at that point.  He was getting some better and then a few days ago he was pushing up to get out of bed and had more pain.  Started using heat last night with some relief.  No L sided pain.  Grip and elbow ROM are still wnl.  Pain with overhead ROM.  He is some better today than yesterday.    Meds, vitals, and allergies reviewed.   ROS: Per HPI unless specifically indicated in ROS section   GEN: nad, alert and oriented HEENT: mucous membranes moist NECK: supple w/o LA R shoulder pain with abduction greater than 45 from the trunk, significant pain with ext rotation, more than internal rotation. Normal passive range of motion at the shoulder, his shoulders not frozen. Normal range of motion and strength at the elbow and wrist. Distally neurovascularly intact. Able to do pendulum range of motion without pain.

## 2016-12-25 NOTE — Patient Instructions (Signed)
Go to the lab on the way out.  We'll contact you with your xray report. Use the rehab exercises and update me if not better.  Take care.  Glad to see you.

## 2016-12-26 DIAGNOSIS — M25519 Pain in unspecified shoulder: Secondary | ICD-10-CM | POA: Insufficient documentation

## 2016-12-26 NOTE — Assessment & Plan Note (Addendum)
Check plain films. Likely significant cuff irritation. I don't think he has a full tear. He does not have arm drop. Check plain films. Home exercises discussed with patient. He declined pain medication. If not improving in the meantime we can refer him to orthopedics. He agrees. Update me as needed. He does not have a frozen shoulder at this point I talked with him about range of motion exercises to prevent such. Anatomy discussed with patient. Home exercise program discussed with patient. Handout given and explained. >25 minutes spent in face to face time with patient, >50% spent in counselling or coordination of care.

## 2017-01-20 DIAGNOSIS — H353211 Exudative age-related macular degeneration, right eye, with active choroidal neovascularization: Secondary | ICD-10-CM | POA: Diagnosis not present

## 2017-01-23 ENCOUNTER — Ambulatory Visit: Payer: Commercial Managed Care - HMO | Admitting: Podiatry

## 2017-01-23 ENCOUNTER — Other Ambulatory Visit: Payer: Self-pay | Admitting: Orthopedic Surgery

## 2017-01-23 DIAGNOSIS — M79645 Pain in left finger(s): Secondary | ICD-10-CM | POA: Diagnosis not present

## 2017-01-23 DIAGNOSIS — L080 Pyoderma: Secondary | ICD-10-CM | POA: Diagnosis not present

## 2017-01-23 DIAGNOSIS — M1A0421 Idiopathic chronic gout, left hand, with tophus (tophi): Secondary | ICD-10-CM | POA: Diagnosis not present

## 2017-01-23 DIAGNOSIS — D492 Neoplasm of unspecified behavior of bone, soft tissue, and skin: Secondary | ICD-10-CM | POA: Diagnosis not present

## 2017-01-24 ENCOUNTER — Encounter (HOSPITAL_BASED_OUTPATIENT_CLINIC_OR_DEPARTMENT_OTHER): Payer: Self-pay | Admitting: Anesthesiology

## 2017-01-24 ENCOUNTER — Encounter (HOSPITAL_BASED_OUTPATIENT_CLINIC_OR_DEPARTMENT_OTHER): Payer: Self-pay | Admitting: *Deleted

## 2017-01-24 ENCOUNTER — Ambulatory Visit (HOSPITAL_BASED_OUTPATIENT_CLINIC_OR_DEPARTMENT_OTHER)
Admission: RE | Admit: 2017-01-24 | Discharge: 2017-01-24 | Disposition: A | Discharge status: Home / Self Care | Payer: Medicare HMO | Source: Ambulatory Visit | Attending: Orthopedic Surgery | Admitting: Orthopedic Surgery

## 2017-01-24 ENCOUNTER — Encounter (HOSPITAL_BASED_OUTPATIENT_CLINIC_OR_DEPARTMENT_OTHER)
Admission: RE | Disposition: A | Discharge status: Home / Self Care | Payer: Self-pay | Source: Ambulatory Visit | Attending: Orthopedic Surgery

## 2017-01-24 DIAGNOSIS — I2510 Atherosclerotic heart disease of native coronary artery without angina pectoris: Secondary | ICD-10-CM | POA: Diagnosis not present

## 2017-01-24 DIAGNOSIS — Z87891 Personal history of nicotine dependence: Secondary | ICD-10-CM | POA: Diagnosis not present

## 2017-01-24 DIAGNOSIS — K219 Gastro-esophageal reflux disease without esophagitis: Secondary | ICD-10-CM | POA: Insufficient documentation

## 2017-01-24 DIAGNOSIS — Z951 Presence of aortocoronary bypass graft: Secondary | ICD-10-CM | POA: Insufficient documentation

## 2017-01-24 DIAGNOSIS — E785 Hyperlipidemia, unspecified: Secondary | ICD-10-CM | POA: Diagnosis not present

## 2017-01-24 DIAGNOSIS — M1A0421 Idiopathic chronic gout, left hand, with tophus (tophi): Secondary | ICD-10-CM | POA: Diagnosis not present

## 2017-01-24 DIAGNOSIS — Z79899 Other long term (current) drug therapy: Secondary | ICD-10-CM | POA: Insufficient documentation

## 2017-01-24 DIAGNOSIS — Z7982 Long term (current) use of aspirin: Secondary | ICD-10-CM | POA: Diagnosis not present

## 2017-01-24 DIAGNOSIS — I10 Essential (primary) hypertension: Secondary | ICD-10-CM | POA: Insufficient documentation

## 2017-01-24 DIAGNOSIS — M1A9XX1 Chronic gout, unspecified, with tophus (tophi): Secondary | ICD-10-CM | POA: Diagnosis not present

## 2017-01-24 DIAGNOSIS — M10042 Idiopathic gout, left hand: Secondary | ICD-10-CM | POA: Diagnosis not present

## 2017-01-24 DIAGNOSIS — L309 Dermatitis, unspecified: Secondary | ICD-10-CM | POA: Diagnosis not present

## 2017-01-24 HISTORY — PX: I&D EXTREMITY: SHX5045

## 2017-01-24 SURGERY — MINOR IRRIGATION AND DEBRIDEMENT EXTREMITY
Anesthesia: LOCAL | Site: Thumb | Laterality: Left

## 2017-01-24 MED ORDER — LIDOCAINE HCL 1 % IJ SOLN
INTRAMUSCULAR | Status: AC
  Filled 2017-01-24: qty 20

## 2017-01-24 MED ORDER — LIDOCAINE HCL 1 % IJ SOLN
INTRAMUSCULAR | Status: DC | PRN
  Administered 2017-01-24: 10 mL

## 2017-01-24 MED ORDER — CHLORHEXIDINE GLUCONATE 4 % EX LIQD: 60.0000 mL | Freq: Once | CUTANEOUS | Status: DC

## 2017-01-24 MED ORDER — BUPIVACAINE HCL (PF) 0.25 % IJ SOLN
INTRAMUSCULAR | Status: DC | PRN
  Administered 2017-01-24: 10 mL

## 2017-01-24 MED ORDER — HYDROCODONE-ACETAMINOPHEN 5-325 MG PO TABS: ORAL_TABLET | ORAL | 0 refills | Status: DC

## 2017-01-24 SURGICAL SUPPLY — 49 items
BAG DECANTER FOR FLEXI CONT (MISCELLANEOUS) IMPLANT
BANDAGE ACE 3X5.8 VEL STRL LF (GAUZE/BANDAGES/DRESSINGS) IMPLANT
BLADE MINI RND TIP GREEN BEAV (BLADE) IMPLANT
BLADE SURG 15 STRL LF DISP TIS (BLADE) ×4 IMPLANT
BLADE SURG 15 STRL SS (BLADE) ×2
BNDG COHESIVE 1X5 TAN STRL LF (GAUZE/BANDAGES/DRESSINGS) IMPLANT
BNDG ELASTIC 2X5.8 VLCR STR LF (GAUZE/BANDAGES/DRESSINGS) IMPLANT
BNDG ESMARK 4X9 LF (GAUZE/BANDAGES/DRESSINGS) IMPLANT
BNDG GAUZE 1X2.1 STRL (MISCELLANEOUS) IMPLANT
BNDG GAUZE ELAST 4 BULKY (GAUZE/BANDAGES/DRESSINGS) IMPLANT
CHLORAPREP W/TINT 26ML (MISCELLANEOUS) ×3 IMPLANT
CORDS BIPOLAR (ELECTRODE) IMPLANT
COVER BACK TABLE 60X90IN (DRAPES) ×3 IMPLANT
COVER MAYO STAND STRL (DRAPES) ×3 IMPLANT
CUFF TOURNIQUET SINGLE 18IN (TOURNIQUET CUFF) ×3 IMPLANT
DRAPE EXTREMITY T 121X128X90 (DRAPE) ×3 IMPLANT
DRAPE SURG 17X23 STRL (DRAPES) ×3 IMPLANT
GAUZE PACKING IODOFORM 1/4X15 (GAUZE/BANDAGES/DRESSINGS) IMPLANT
GAUZE SPONGE 4X4 12PLY STRL (GAUZE/BANDAGES/DRESSINGS) ×3 IMPLANT
GAUZE XEROFORM 1X8 LF (GAUZE/BANDAGES/DRESSINGS) ×3 IMPLANT
GLOVE BIO SURGEON STRL SZ 6.5 (GLOVE) ×3 IMPLANT
GLOVE BIO SURGEON STRL SZ7.5 (GLOVE) ×3 IMPLANT
GLOVE BIOGEL PI IND STRL 7.0 (GLOVE) ×2 IMPLANT
GLOVE BIOGEL PI IND STRL 8 (GLOVE) ×2 IMPLANT
GLOVE BIOGEL PI INDICATOR 7.0 (GLOVE) ×1
GLOVE BIOGEL PI INDICATOR 8 (GLOVE) ×1
GOWN STRL REUS W/ TWL LRG LVL3 (GOWN DISPOSABLE) ×2 IMPLANT
GOWN STRL REUS W/TWL LRG LVL3 (GOWN DISPOSABLE) ×1
GOWN STRL REUS W/TWL XL LVL3 (GOWN DISPOSABLE) ×3 IMPLANT
LOOP VESSEL MAXI BLUE (MISCELLANEOUS) IMPLANT
NEEDLE HYPO 25X1 1.5 SAFETY (NEEDLE) IMPLANT
NS IRRIG 1000ML POUR BTL (IV SOLUTION) ×3 IMPLANT
PACK BASIN DAY SURGERY FS (CUSTOM PROCEDURE TRAY) ×3 IMPLANT
PAD CAST 3X4 CTTN HI CHSV (CAST SUPPLIES) IMPLANT
PADDING CAST ABS 4INX4YD NS (CAST SUPPLIES) ×1
PADDING CAST ABS COTTON 4X4 ST (CAST SUPPLIES) ×2 IMPLANT
PADDING CAST COTTON 3X4 STRL (CAST SUPPLIES)
SPLINT PLASTER CAST XFAST 3X15 (CAST SUPPLIES) IMPLANT
SPLINT PLASTER XTRA FASTSET 3X (CAST SUPPLIES)
STOCKINETTE 4X48 STRL (DRAPES) ×3 IMPLANT
SUT ETHILON 4 0 PS 2 18 (SUTURE) IMPLANT
SWAB COLLECTION DEVICE MRSA (MISCELLANEOUS) ×3 IMPLANT
SWAB CULTURE ESWAB REG 1ML (MISCELLANEOUS) IMPLANT
SYR 20CC LL (SYRINGE) IMPLANT
SYR BULB 3OZ (MISCELLANEOUS) ×3 IMPLANT
SYR CONTROL 10ML LL (SYRINGE) IMPLANT
TOWEL OR 17X24 6PK STRL BLUE (TOWEL DISPOSABLE) ×6 IMPLANT
TUBE FEEDING ENTERAL 5FR 16IN (TUBING) IMPLANT
UNDERPAD 30X30 (UNDERPADS AND DIAPERS) ×3 IMPLANT

## 2017-01-24 NOTE — H&P (Signed)
Alan [name] is an 71 y.o. male.   Chief Complaint: left thumb gout HPI: 71 yo male with swelling of left thumb x several months.  Lanced at dermatologist yesterday.  Throbbing pain in thumb.  5/10 severity.  Aggravated by palpation, relieved with rest.  No fevers, chills, night sweats.  Allergies: No Known Allergies  Past Medical History:  Diagnosis Date  . CAD (coronary artery disease) 07/17/2004  . Diverticulosis of colon 12/15/2005  . Esophageal stricture   . Gastritis   . GERD (gastroesophageal reflux disease) 09/16/1996  . Glaucoma   . History of tobacco abuse    Quit 2000  . Hx of cardiovascular stress test    ETT-Myoview (01/2014):  1.5 mm ST depression in inf leads at peak exercise; no ischemia or scar, EF 57%, Low Risk  . Hyperlipidemia 03/17/1999  . Hypertension 03/17/1999  . Stress fracture of foot    right midfoot, per Dr. Canary Brim 2012    Past Surgical History:  Procedure Laterality Date  . COLONOSCOPY  12/30/2005   Divertics, mild, 10 yrs  . COLONOSCOPY  11/26/2006   Colitis biopsy neg  . CORONARY ARTERY BYPASS GRAFT  07/27/2004   By Dr. Roxy Manns with a LIMA to the LAD, RIMA to the distal right coronary artery, spahenous vein graft first diagonal, saphenous vein graft to the first circumflex marginal, sequential saphenous vein graft to the second  . ESOPHAGOGASTRODUODENOSCOPY  10/08/2000   Stricture distal esoph, repeat dilation done 2012  . ESOPHAGOGASTRODUODENOSCOPY  10/08/2000   Stricture distal esoph    Family History: Family History  Problem Relation Age of Onset  . Peripheral vascular disease Mother   . Stroke Father        x2  . Hypertension Brother   . Heart attack Brother        MI  . Colon polyps Brother   . Heart disease Brother        MI  . Hypertension Sister   . Breast cancer Sister   . Cancer Sister        Breast CA  . Hypertension Sister   . Hypertension Sister   . Colon polyps Other   . Diabetes Neg Hx   . Depression Neg Hx   . Alcohol  abuse Neg Hx   . Drug abuse Neg Hx   . Prostate cancer Neg Hx   . Colon cancer Neg Hx     Social History:   reports that he quit smoking about 18 years ago. His smoking use included Cigarettes. He has never used smokeless tobacco. He reports that he drinks alcohol. He reports that he does not use drugs.  Medications: Medications Prior to Admission  Medication Sig Dispense Refill  . amLODipine (NORVASC) 10 MG tablet Take 1 tablet (10 mg total) by mouth daily. 90 tablet 3  . aspirin 81 MG tablet Take 81 mg by mouth daily.      Marland Kitchen atorvastatin (LIPITOR) 80 MG tablet Take 0.5 tablets (40 mg total) by mouth daily. 45 tablet 3  . doxycycline (VIBRAMYCIN) 100 MG capsule Take 100 mg by mouth 2 (two) times daily.    . metoprolol (LOPRESSOR) 100 MG tablet TAKE 1 TABLET TWICE A DAY 180 tablet 3  . spironolactone (ALDACTONE) 25 MG tablet Take 1 tablet (25 mg total) by mouth daily. 90 tablet 3  . timolol (BETIMOL) 0.5 % ophthalmic solution 1 drop as directed.      . valsartan-hydrochlorothiazide (DIOVAN-HCT) 160-25 MG tablet Take 1 tablet by  mouth daily. 90 tablet 3  . [facility] IBUPROFEN IB 200 MG tablet As needed    . Loratadine (CLARITIN PO) Take 1 tablet by mouth daily as needed. Take 1 tab by mouth daily as needed    . RaNITidine HCl (RANITIDINE 75 PO) Take 1 tablet by mouth daily as needed. Take 1 tab by mouth daily as needed    . sildenafil (REVATIO) 20 MG tablet Take 5 tablets (100 mg total) by mouth daily as needed. 50 tablet 12    No results found for this or any previous visit (from the past 48 hour(s)).  No results found.   A comprehensive review of systems was negative.  Blood pressure (!) 138/58, pulse 67, temperature 98.5 F (36.9 C), temperature source Oral, resp. rate 18, SpO2 98 %.  General appearance: alert, cooperative and appears stated age Head: Normocephalic, without obvious abnormality, atraumatic Neck: supple, symmetrical, trachea midline Resp: clear to auscultation  bilaterally Cardio: regular rate and rhythm GI: non-tender Extremities: Intact sensation and capillary refill all digits.  +epl/fpl/io.  Swollen left thumb pad with white discoloration under skin.  Small incision present.  No proximal streaking. Pulses: 2+ and symmetric Skin: Skin color, texture, turgor normal. No rashes or lesions Neurologic: Grossly normal Incision/Wound:as above  Assessment/Plan Left thumb gouty tophus.  Recommend I&D in or with excision of gouty tophus.  Non operative and operative treatment options were discussed with the patient and patient wishes to proceed with operative treatment. Risks, benefits, and alternatives of surgery were discussed and the patient agrees with the plan of care.   Trenisha Lafavor R 01/24/2017, 1:19 PM

## 2017-01-24 NOTE — Brief Op Note (Signed)
01/24/2017  1:57 PM  PATIENT:  Alan [name]  71 y.o. male  PRE-OPERATIVE DIAGNOSIS:  GOUTY ARTHOPATHY LEFT THUMB  POST-OPERATIVE DIAGNOSIS:  GOUTY ARTHOPATHY LEFT THUMB  PROCEDURE:  Procedure(s): MINOR INCISION AND DRAINAGE LEFT THUMB (Left)  SURGEON:  Surgeon(s) and Role:    * Leanora Cover, MD - Primary  PHYSICIAN ASSISTANT:   ASSISTANTS: none   ANESTHESIA:   local  EBL:  No intake/output data recorded.  BLOOD ADMINISTERED:none  DRAINS: iodoform packing  LOCAL MEDICATIONS USED:  MARCAINE    and LIDOCAINE   SPECIMEN:  Source of Specimen:  left thumb  DISPOSITION OF SPECIMEN:  cultures to micro, tophus to pathology  COUNTS:  YES  TOURNIQUET:    DICTATION: .Other Dictation: Dictation Number [id number]  PLAN OF CARE: Discharge to home after PACU  PATIENT DISPOSITION:  PACU - hemodynamically stable.

## 2017-01-24 NOTE — Op Note (Signed)
[id number] 

## 2017-01-24 NOTE — Discharge Instructions (Signed)

## 2017-01-27 ENCOUNTER — Other Ambulatory Visit: Payer: Self-pay | Admitting: Cardiology

## 2017-01-27 ENCOUNTER — Encounter (HOSPITAL_BASED_OUTPATIENT_CLINIC_OR_DEPARTMENT_OTHER): Payer: Self-pay | Admitting: Orthopedic Surgery

## 2017-01-27 DIAGNOSIS — M79645 Pain in left finger(s): Secondary | ICD-10-CM | POA: Diagnosis not present

## 2017-01-27 DIAGNOSIS — M1A0421 Idiopathic chronic gout, left hand, with tophus (tophi): Secondary | ICD-10-CM | POA: Diagnosis not present

## 2017-01-27 DIAGNOSIS — M25649 Stiffness of unspecified hand, not elsewhere classified: Secondary | ICD-10-CM | POA: Diagnosis not present

## 2017-01-27 DIAGNOSIS — S61002D Unspecified open wound of left thumb without damage to nail, subsequent encounter: Secondary | ICD-10-CM | POA: Diagnosis not present

## 2017-01-27 DIAGNOSIS — M7989 Other specified soft tissue disorders: Secondary | ICD-10-CM | POA: Diagnosis not present

## 2017-01-27 NOTE — Op Note (Signed)
NAME:  ZAYD, BONET NO.:  MEDICAL RECORD NO.:  [id number]  LOCATION:                                 FACILITY:  PHYSICIAN:  Leanora Cover, MD        DATE OF BIRTH:  [birth year]  DATE OF PROCEDURE:  01/24/2017 DATE OF DISCHARGE:                              OPERATIVE REPORT   PREOPERATIVE DIAGNOSIS:  Left thumb gouty tophus.  POSTOPERATIVE DIAGNOSIS:  Left thumb gouty tophus.  PROCEDURE:   1. Incision and drainage of left distal phalanx  2. Excision of left thumb gouty tophus, subfascial; 1.5 cm in size.  SURGEON:  Leanora Cover, MD  ASSISTANT:  None.  ANESTHESIA:  Digital block with half and half solution of 1% plain lidocaine and 0.25% plain Marcaine.  TOURNIQUET TIME:  Approximately 15 minutes.  SPECIMENS:  Cultures to Micro and tophus to Pathology.  DISPOSITION:  Stable to PACU.  INDICATIONS:  Mr. Keimig is a 71 year old male who has had several months of swelling of the left thumb.  This has been painful and bothersome to him.  An incision was performed by his dermatologist yesterday, and he was referred to me for further care.  No fevers, chills, or night sweats.  I recommended incision and drainage in the operating room. Risks, benefits and alternatives of the surgery were discussed including the risk of blood loss; infection; damage to nerves, vessels, tendons, ligaments, bone; failure of surgery; need for additional surgery; complications with wound healing; continued pain and recurrence of tophus.  He voiced understanding of these risks and elected to proceed.  OPERATIVE COURSE:  After being identified preoperatively by myself, the patient and I agreed upon the procedure and site of procedure.  Surgical site was marked.  Risks, benefits and alternatives of the surgery were reviewed and they wished to proceed.  Surgical consent had been signed. He was transferred to the operating room and placed on the operating room table in supine  position with left upper extremity on armboard. Surgical pause was performed between the surgeons, operating staff and the patient, all were in agreement as to the patient, procedure and site of procedure.  Digital block was performed with 10 mL of half and half solution of 1% plain lidocaine and 0.25% plain Marcaine.  This was adequate to give digital anesthesia of the thumb.  The left arm was prepped and draped in normal sterile orthopedic fashion.  A surgical pause was again performed and all were agreement as to the patient, procedure and site of procedure.  Penrose drain was used as a tourniquet and was up for approximately 15 minutes.  Incision was made at the ulnar side of the thumb where previous incision had been made.  This was carried into subcutaneous tissues by spreading technique.  There was thick whitish material within.  Cultures were taken and sent to Micro. The whitish material was milked out of the finger and sent to Pathology and absolute alcohol for examination.  The cavity was examined.  The whitish material was within all the tissues.  It was removed as best possible.  There was significant improvement in the shape of the  thumb afterward.  The wound was copiously irrigated with sterile saline and packed with 0.25-inch iodoform gauze.  It was then dressed with sterile Xeroform, 4 x 4, and wrapped with a Coban dressing lightly.  The Penrose drain was removed.  He tolerated the procedure well.  I will give him a prescription for Norco 5/325 one to two p.o. q.6 hours p.r.n. pain, dispensed #20 and he will continue on his doxycycline.  I will see him back in the office at the beginning of next week.     Leanora Cover, MD     KK/MEDQ  D:  01/24/2017  T:  01/25/2017  Job:  [id number]

## 2017-01-29 ENCOUNTER — Encounter (HOSPITAL_BASED_OUTPATIENT_CLINIC_OR_DEPARTMENT_OTHER): Payer: Self-pay | Admitting: Orthopedic Surgery

## 2017-01-29 DIAGNOSIS — M25649 Stiffness of unspecified hand, not elsewhere classified: Secondary | ICD-10-CM | POA: Diagnosis not present

## 2017-01-29 DIAGNOSIS — M7989 Other specified soft tissue disorders: Secondary | ICD-10-CM | POA: Diagnosis not present

## 2017-01-29 DIAGNOSIS — M1A0421 Idiopathic chronic gout, left hand, with tophus (tophi): Secondary | ICD-10-CM | POA: Diagnosis not present

## 2017-01-29 DIAGNOSIS — S61002D Unspecified open wound of left thumb without damage to nail, subsequent encounter: Secondary | ICD-10-CM | POA: Diagnosis not present

## 2017-01-29 DIAGNOSIS — M79645 Pain in left finger(s): Secondary | ICD-10-CM | POA: Diagnosis not present

## 2017-01-29 LAB — AEROBIC/ANAEROBIC CULTURE (SURGICAL/DEEP WOUND)

## 2017-01-29 LAB — AEROBIC/ANAEROBIC CULTURE W GRAM STAIN (SURGICAL/DEEP WOUND): Culture: NO GROWTH

## 2017-01-30 DIAGNOSIS — S61002D Unspecified open wound of left thumb without damage to nail, subsequent encounter: Secondary | ICD-10-CM | POA: Diagnosis not present

## 2017-01-30 DIAGNOSIS — M1A0421 Idiopathic chronic gout, left hand, with tophus (tophi): Secondary | ICD-10-CM | POA: Diagnosis not present

## 2017-01-30 DIAGNOSIS — M7989 Other specified soft tissue disorders: Secondary | ICD-10-CM | POA: Diagnosis not present

## 2017-01-30 DIAGNOSIS — M25649 Stiffness of unspecified hand, not elsewhere classified: Secondary | ICD-10-CM | POA: Diagnosis not present

## 2017-01-30 DIAGNOSIS — M79645 Pain in left finger(s): Secondary | ICD-10-CM | POA: Diagnosis not present

## 2017-01-31 ENCOUNTER — Telehealth: Payer: Self-pay

## 2017-01-31 DIAGNOSIS — M1A0421 Idiopathic chronic gout, left hand, with tophus (tophi): Secondary | ICD-10-CM | POA: Diagnosis not present

## 2017-01-31 DIAGNOSIS — M79645 Pain in left finger(s): Secondary | ICD-10-CM | POA: Diagnosis not present

## 2017-01-31 DIAGNOSIS — M7989 Other specified soft tissue disorders: Secondary | ICD-10-CM | POA: Diagnosis not present

## 2017-01-31 DIAGNOSIS — S61002D Unspecified open wound of left thumb without damage to nail, subsequent encounter: Secondary | ICD-10-CM | POA: Diagnosis not present

## 2017-01-31 DIAGNOSIS — M25649 Stiffness of unspecified hand, not elsewhere classified: Secondary | ICD-10-CM | POA: Diagnosis not present

## 2017-01-31 NOTE — Telephone Encounter (Signed)
Spoke with wife who scheduled appt at Saturday clinic.

## 2017-01-31 NOTE — Telephone Encounter (Signed)
Needs OV re: BP, sooner rather than later, UC vs Saturday clinic.  Thanks.

## 2017-01-31 NOTE — Telephone Encounter (Signed)
Jeani Hawking nurse at [facility] left v/m; pt was seen earlier today for wound care and pt fainted BP 74/28.  Before pt left office BP 108/41. Pt felt OK when left office but Jeani Hawking wanted to know if pt should be seen since BP was so low. Jeani Hawking request cb. I tried to reach pt and his wife and not able to speak by phone on any contact #.Please advise.

## 2017-01-31 NOTE — Telephone Encounter (Signed)
Pt called back; pt said when he went to the wound care this morning he did not eat breakfast before going to have his thumb repacked (pt had thumb opened up and cleaned out  previously ). Pt knew when he sat down he was going to faint; pt said he feels fine now. FYI to Dr Damita Dunnings.

## 2017-02-01 ENCOUNTER — Encounter (INDEPENDENT_AMBULATORY_CARE_PROVIDER_SITE_OTHER): Payer: Self-pay

## 2017-02-01 ENCOUNTER — Encounter: Payer: Self-pay | Admitting: Family Medicine

## 2017-02-01 ENCOUNTER — Ambulatory Visit (INDEPENDENT_AMBULATORY_CARE_PROVIDER_SITE_OTHER): Payer: Medicare HMO | Admitting: Family Medicine

## 2017-02-01 VITALS — BP 130/82 | HR 79 | Temp 98.2°F | Wt 191.0 lb

## 2017-02-01 DIAGNOSIS — R55 Syncope and collapse: Secondary | ICD-10-CM

## 2017-02-01 NOTE — Patient Instructions (Addendum)
It sounds like you had an episode of vasovagal syncope (fainting due to painful stimuli) yesterday.  Be sure that you eat prior to your next dressing changes, and if you start to feel faint stop the procedure and put your head down.   Let us know if you continue to have this sort of problem

## 2017-02-01 NOTE — Progress Notes (Signed)
[place] [facility] [address], [place], [place] [address] [phone] [id number]  Date:  02/01/2017   Name:  Alan [name]   DOB:  [birth year]   MRN:  [id number]  PCP:  Alan Ghent, MD    Chief Complaint: Blood Pressure Check (Patient passed out while having finger dressed. )   History of Present Illness:  Alan [name] is a 71 y.o. very pleasant male patient who presents with the following:  Yesterday he was at the hand surgeon to have his left thumb treated- they did a dressing change and whirlpool treatment/ debridement.   He had developed an infection that he let go for a while which became complicated and is requiring biweekly treatment right now.   He had not eaten all day, he went to have the hand treated at about noon.  The procedure was a bit painful and seemed to take longer than usual.  Alan [name] started to feel clammy and dizzy, and then and he passed out.  He was sitting down in a chair with his hand in front of him on a table.   He went pale, started to have LOC- clinic staff caught him and he did not hit the ground.   His wife states that he did not really come back around for about 5 minutes.  He was not unconscious all this time but was not able to sit up  They did check his glucose and it was normal per their report   He did not have any urinary incont  Never happened to him in the past.    He is now feeling back to normal He does take BP medications No fever or chills, he feels fine today   Patient Active Problem List   Diagnosis Date Noted  . Shoulder pain 12/26/2016  . Healthcare maintenance 08/28/2016  . 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 Advance care planning 08/21/2014  . Cough 12/03/2012  . Medicare annual wellness visit, subsequent 08/06/2012  . Skin lesion 08/06/2012  . Carotid stenosis 11/26/2011  . AK (actinic keratosis) 07/25/2011  . GASTRITIS 10/08/2010  . ESOPHAGEAL STRICTURE 09/07/2010  .  OTHER DYSPHAGIA 07/17/2010  . OVERWEIGHT 11/21/2008  . COLITIS 07/07/2008  . TESTOSTERONE DEFICIENCY 06/04/2007  . GLAUCOMA NOS 06/04/2007  . 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 HYPERGLYCEMIA 06/04/2007  . DIVERTICULOSIS, COLON 12/15/2005  . CORONARY ARTERY BYPASS GRAFT, HX OF 07/21/2004  . Coronary atherosclerosis 07/17/2004  . Hyperlipidemia 03/17/1999  . Essential hypertension 03/17/1999  . GERD 09/16/1996    Past Medical History:  Diagnosis Date  . CAD (coronary artery disease) 07/17/2004  . Diverticulosis of colon 12/15/2005  . Esophageal stricture   . Gastritis   . GERD (gastroesophageal reflux disease) 09/16/1996  . Glaucoma   . History of tobacco abuse    Quit 2000  . Hx of cardiovascular stress test    ETT-Myoview (01/2014):  1.5 mm ST depression in inf leads at peak exercise; no ischemia or scar, EF 57%, Low Risk  . Hyperlipidemia 03/17/1999  . Hypertension 03/17/1999  . Stress fracture of foot    right midfoot, per Dr. Canary [name] 2012    Past Surgical History:  Procedure Laterality Date  . COLONOSCOPY  12/30/2005   Divertics, mild, 10 yrs  . COLONOSCOPY  11/26/2006   Colitis biopsy neg  . CORONARY ARTERY BYPASS GRAFT  07/27/2004   By Dr. Roxy [name] with a LIMA to the LAD, RIMA to the  distal right coronary artery, spahenous vein graft first diagonal, saphenous vein graft to the first circumflex marginal, sequential saphenous vein graft to the second  . ESOPHAGOGASTRODUODENOSCOPY  10/08/2000   Stricture distal esoph, repeat dilation done 2012  . ESOPHAGOGASTRODUODENOSCOPY  10/08/2000   Stricture distal esoph  . I&D EXTREMITY Left 01/24/2017   Procedure: MINOR INCISION AND DRAINAGE LEFT THUMB;  Surgeon: Alan Cover, MD;  Location: [facility];  Service: Orthopedics;  Laterality: Left;    Social History  Substance Use Topics  . Smoking status: Former Smoker    Types: Cigarettes    Quit date: 09/16/1998  . Smokeless tobacco: Never Used  . Alcohol use Yes      Comment: 12 pack Bud light per week    Family History  Problem Relation Age of Onset  . Peripheral vascular disease Mother   . Stroke Father        x2  . Hypertension Brother   . Heart attack Brother        MI  . Colon polyps Brother   . Heart disease Brother        MI  . Hypertension Sister   . Breast cancer Sister   . Cancer Sister        Breast CA  . Hypertension Sister   . Hypertension Sister   . Colon polyps Other   . Diabetes Neg Hx   . Depression Neg Hx   . Alcohol abuse Neg Hx   . Drug abuse Neg Hx   . Prostate cancer Neg Hx   . Colon cancer Neg Hx     No Known Allergies  Medication list has been reviewed and updated.  Current Outpatient Prescriptions on File Prior to Visit  Medication Sig Dispense Refill  . amLODipine (NORVASC) 10 MG tablet Take 1 tablet (10 mg total) by mouth daily. 90 tablet 3  . aspirin 81 MG tablet Take 81 mg by mouth daily.      Marland Kitchen atorvastatin (LIPITOR) 80 MG tablet Take 0.5 tablets (40 mg total) by mouth daily. 45 tablet 3  . [facility] IBUPROFEN IB 200 MG tablet As needed    . doxycycline (VIBRAMYCIN) 100 MG capsule Take 100 mg by mouth 2 (two) times daily.    Marland Kitchen HYDROcodone-acetaminophen (NORCO) 5-325 MG tablet 1-2 tabs po q6 hours prn pain 20 tablet 0  . Loratadine (CLARITIN PO) Take 1 tablet by mouth daily as needed. Take 1 tab by mouth daily as needed    . metoprolol tartrate (LOPRESSOR) 100 MG tablet TAKE 1 TABLET TWICE DAILY 180 tablet 3  . RaNITidine HCl (RANITIDINE 75 PO) Take 1 tablet by mouth daily as needed. Take 1 tab by mouth daily as needed    . sildenafil (REVATIO) 20 MG tablet Take 5 tablets (100 mg total) by mouth daily as needed. 50 tablet 12  . spironolactone (ALDACTONE) 25 MG tablet Take 1 tablet (25 mg total) by mouth daily. 90 tablet 3  . timolol (BETIMOL) 0.5 % ophthalmic solution 1 drop as directed.      . valsartan-hydrochlorothiazide (DIOVAN-HCT) 160-25 MG tablet Take 1 tablet by mouth daily. 90 tablet 3   No  current facility-administered medications on file prior to visit.     Review of Systems:  As per HPI- otherwise negative. No CP or SOB   Physical Examination: Vitals:   02/01/17 1037  BP: 130/82  Pulse: 79  Temp: 98.2 F (36.8 C)   Vitals:   02/01/17 1037  Weight:  191 lb (86.6 kg)   Body mass index is 29.04 kg/m. Ideal Body Weight:    GEN: WDWN, NAD, Non-toxic, A & O x 3 HEENT: Atraumatic, Normocephalic. Neck supple. No masses, No LAD. Ears and Nose: No external deformity. CV: RRR, No M/G/R. No JVD. No thrill. No extra heart sounds. PULM: CTA B, no wheezes, crackles, rhonchi. No retractions. No resp. distress. No accessory muscle use. ABD: S, NT, ND, +BS. No rebound. No HSM. EXTR: No c/c/e NEURO Normal gait.  PSYCH: Normally interactive. Conversant. Not depressed or anxious appearing.  Calm demeanor.    Assessment and Plan: Vasovagal syncope  Here today to follow-up an episode of syncope that occurred during a painful procedure yesterday.  Likely benign vasovagal syncope- reassurance and avoidance discussed.  They will seek care if any other concerns   Signed Lamar Blinks, MD

## 2017-02-03 DIAGNOSIS — S61002D Unspecified open wound of left thumb without damage to nail, subsequent encounter: Secondary | ICD-10-CM | POA: Diagnosis not present

## 2017-02-03 DIAGNOSIS — M25649 Stiffness of unspecified hand, not elsewhere classified: Secondary | ICD-10-CM | POA: Diagnosis not present

## 2017-02-03 DIAGNOSIS — M1A0421 Idiopathic chronic gout, left hand, with tophus (tophi): Secondary | ICD-10-CM | POA: Diagnosis not present

## 2017-02-03 DIAGNOSIS — M7989 Other specified soft tissue disorders: Secondary | ICD-10-CM | POA: Diagnosis not present

## 2017-02-03 DIAGNOSIS — M79645 Pain in left finger(s): Secondary | ICD-10-CM | POA: Diagnosis not present

## 2017-02-07 DIAGNOSIS — M25649 Stiffness of unspecified hand, not elsewhere classified: Secondary | ICD-10-CM | POA: Diagnosis not present

## 2017-02-07 DIAGNOSIS — S61002D Unspecified open wound of left thumb without damage to nail, subsequent encounter: Secondary | ICD-10-CM | POA: Diagnosis not present

## 2017-02-07 DIAGNOSIS — M1A0421 Idiopathic chronic gout, left hand, with tophus (tophi): Secondary | ICD-10-CM | POA: Diagnosis not present

## 2017-02-07 DIAGNOSIS — M7989 Other specified soft tissue disorders: Secondary | ICD-10-CM | POA: Diagnosis not present

## 2017-02-12 DIAGNOSIS — M25649 Stiffness of unspecified hand, not elsewhere classified: Secondary | ICD-10-CM | POA: Diagnosis not present

## 2017-02-12 DIAGNOSIS — S61002D Unspecified open wound of left thumb without damage to nail, subsequent encounter: Secondary | ICD-10-CM | POA: Diagnosis not present

## 2017-02-12 DIAGNOSIS — M1A0421 Idiopathic chronic gout, left hand, with tophus (tophi): Secondary | ICD-10-CM | POA: Diagnosis not present

## 2017-02-12 DIAGNOSIS — M7989 Other specified soft tissue disorders: Secondary | ICD-10-CM | POA: Diagnosis not present

## 2017-02-17 DIAGNOSIS — M7989 Other specified soft tissue disorders: Secondary | ICD-10-CM | POA: Diagnosis not present

## 2017-02-17 DIAGNOSIS — S61002D Unspecified open wound of left thumb without damage to nail, subsequent encounter: Secondary | ICD-10-CM | POA: Diagnosis not present

## 2017-02-17 DIAGNOSIS — M25649 Stiffness of unspecified hand, not elsewhere classified: Secondary | ICD-10-CM | POA: Diagnosis not present

## 2017-02-17 DIAGNOSIS — M1A0421 Idiopathic chronic gout, left hand, with tophus (tophi): Secondary | ICD-10-CM | POA: Diagnosis not present

## 2017-02-19 DIAGNOSIS — M1A0421 Idiopathic chronic gout, left hand, with tophus (tophi): Secondary | ICD-10-CM | POA: Diagnosis not present

## 2017-04-01 ENCOUNTER — Ambulatory Visit (INDEPENDENT_AMBULATORY_CARE_PROVIDER_SITE_OTHER): Payer: Medicare HMO | Admitting: Family Medicine

## 2017-04-01 ENCOUNTER — Encounter: Payer: Self-pay | Admitting: Family Medicine

## 2017-04-01 VITALS — BP 128/64 | HR 67 | Temp 97.7°F | Wt 186.2 lb

## 2017-04-01 DIAGNOSIS — E785 Hyperlipidemia, unspecified: Secondary | ICD-10-CM | POA: Diagnosis not present

## 2017-04-01 DIAGNOSIS — M109 Gout, unspecified: Secondary | ICD-10-CM

## 2017-04-01 DIAGNOSIS — H6120 Impacted cerumen, unspecified ear: Secondary | ICD-10-CM | POA: Diagnosis not present

## 2017-04-01 MED ORDER — LOSARTAN POTASSIUM 100 MG PO TABS: 100.0000 mg | ORAL_TABLET | Freq: Every day | ORAL | 3 refills | Status: DC

## 2017-04-01 NOTE — Progress Notes (Signed)
His shoulder is getting better, back to baseline.  This is improved.    Recently with L thumb pain.  Had progressive swelling and pain.  Has seen the hand clinic, with tophus excision.  The area is healing in the meantime.  He has f/u with hand clinic pending for next month.   He is on HCTZ in the meantime.  Discussed with patient about gout.  Prev uric acid 8.7 on 02/19/17.  Not on any prophylactic medication currently.  L 2nd finger stiff but no trauma.  He didn't know if this was related to gout.   B ears felt closed since swimming.    Leg stiffness started earlier this year.  Is on statin.  No trauma.  Pain going up and down steps but with walking in general.  Not SOB, no CP.  Normal DP pulses.  He has still been able to bicycle w/o troubles.    Meds, vitals, and allergies reviewed.   ROS: Per HPI unless specifically indicated in ROS section   GEN: nad, alert and oriented HEENT: mucous membranes moist, B cerumen impaction, L>R.  L removed with curette, R with irrigation, recheck wnl.  NECK: supple w/o LA CV: rrr.  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 No acute abnormality otherwise on the hand on inspection. No tendon defect limiting range of motion for the fingers.

## 2017-04-01 NOTE — Patient Instructions (Addendum)
Stop valsartan/HCTZ for now.  Change to plain losartan.  Recheck labs in about 2-3 weeks, before a visit. We'll go from there.   Stop the lipitor and see if the leg stiffness gets better.  Update me in about 1 week about the lipitor.    Take care.  Glad to see you.

## 2017-04-02 DIAGNOSIS — M109 Gout, unspecified: Secondary | ICD-10-CM | POA: Insufficient documentation

## 2017-04-02 DIAGNOSIS — H6120 Impacted cerumen, unspecified ear: Secondary | ICD-10-CM | POA: Insufficient documentation

## 2017-04-02 NOTE — Assessment & Plan Note (Signed)
He has normal dorsalis pedis pulses. He doesn't have typical claudication symptoms. It may be statin related leg aches. Stop statin for 1 week and then update me. He agrees. >25 minutes spent in face to face time with patient, >50% spent in counselling or coordination of care.

## 2017-04-02 NOTE — Assessment & Plan Note (Signed)
Discussed with patient about options. Stop hydrochlorothiazide. Change over to losartan. Both of those changes may improve his uric acid level. We can recheck his labs prior to visit in a few weeks. We can adjust his blood pressure medications on follow-up if needed. Rationale discussed with patient. If needed we may add on another prophylactic medication to lower his uric acid level. Rationale discussed with patient. He agrees.  He may have arthritic changes in the second finger but this is likely not contributory.

## 2017-04-02 NOTE — Assessment & Plan Note (Signed)
Resolve. See above. Recheck normal.

## 2017-04-07 DIAGNOSIS — H353131 Nonexudative age-related macular degeneration, bilateral, early dry stage: Secondary | ICD-10-CM | POA: Diagnosis not present

## 2017-04-07 DIAGNOSIS — H353212 Exudative age-related macular degeneration, right eye, with inactive choroidal neovascularization: Secondary | ICD-10-CM | POA: Diagnosis not present

## 2017-04-07 DIAGNOSIS — H43813 Vitreous degeneration, bilateral: Secondary | ICD-10-CM | POA: Diagnosis not present

## 2017-04-07 DIAGNOSIS — H4010X1 Unspecified open-angle glaucoma, mild stage: Secondary | ICD-10-CM | POA: Diagnosis not present

## 2017-04-07 DIAGNOSIS — H35051 Retinal neovascularization, unspecified, right eye: Secondary | ICD-10-CM | POA: Diagnosis not present

## 2017-04-09 DIAGNOSIS — M1A0421 Idiopathic chronic gout, left hand, with tophus (tophi): Secondary | ICD-10-CM | POA: Diagnosis not present

## 2017-04-11 ENCOUNTER — Telehealth: Payer: Self-pay

## 2017-04-11 DIAGNOSIS — M1A0421 Idiopathic chronic gout, left hand, with tophus (tophi): Secondary | ICD-10-CM | POA: Diagnosis not present

## 2017-04-11 DIAGNOSIS — M25649 Stiffness of unspecified hand, not elsewhere classified: Secondary | ICD-10-CM | POA: Diagnosis not present

## 2017-04-11 DIAGNOSIS — M79645 Pain in left finger(s): Secondary | ICD-10-CM | POA: Diagnosis not present

## 2017-04-11 DIAGNOSIS — S61002D Unspecified open wound of left thumb without damage to nail, subsequent encounter: Secondary | ICD-10-CM | POA: Diagnosis not present

## 2017-04-11 DIAGNOSIS — M7989 Other specified soft tissue disorders: Secondary | ICD-10-CM | POA: Diagnosis not present

## 2017-04-11 NOTE — Telephone Encounter (Signed)
We can start a med but I want to see his f/u labs after stopping HCTZ and changing to losartan.  I don't want to drop his level too quickly by adding on another med in the meantime.  He should be coming back for labs soon and we can address prophylactic med when I see those labs.  Thanks.

## 2017-04-11 NOTE — Telephone Encounter (Signed)
Patient advised.

## 2017-04-11 NOTE — Telephone Encounter (Signed)
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 Pt request cb this afternoon; has doctor appt this morning.Please advise.pt has f/u appt on 04/24/17.

## 2017-04-14 DIAGNOSIS — M25649 Stiffness of unspecified hand, not elsewhere classified: Secondary | ICD-10-CM | POA: Diagnosis not present

## 2017-04-14 DIAGNOSIS — M1A0421 Idiopathic chronic gout, left hand, with tophus (tophi): Secondary | ICD-10-CM | POA: Diagnosis not present

## 2017-04-14 DIAGNOSIS — S61002D Unspecified open wound of left thumb without damage to nail, subsequent encounter: Secondary | ICD-10-CM | POA: Diagnosis not present

## 2017-04-16 DIAGNOSIS — S61002D Unspecified open wound of left thumb without damage to nail, subsequent encounter: Secondary | ICD-10-CM | POA: Diagnosis not present

## 2017-04-16 DIAGNOSIS — M1A0421 Idiopathic chronic gout, left hand, with tophus (tophi): Secondary | ICD-10-CM | POA: Diagnosis not present

## 2017-04-20 DIAGNOSIS — M1A0421 Idiopathic chronic gout, left hand, with tophus (tophi): Secondary | ICD-10-CM | POA: Diagnosis not present

## 2017-04-20 DIAGNOSIS — M79645 Pain in left finger(s): Secondary | ICD-10-CM | POA: Diagnosis not present

## 2017-04-20 DIAGNOSIS — M7989 Other specified soft tissue disorders: Secondary | ICD-10-CM | POA: Diagnosis not present

## 2017-04-20 DIAGNOSIS — S61002D Unspecified open wound of left thumb without damage to nail, subsequent encounter: Secondary | ICD-10-CM | POA: Diagnosis not present

## 2017-04-20 DIAGNOSIS — M25649 Stiffness of unspecified hand, not elsewhere classified: Secondary | ICD-10-CM | POA: Diagnosis not present

## 2017-04-22 ENCOUNTER — Ambulatory Visit: Payer: Medicare HMO | Admitting: Family Medicine

## 2017-04-22 ENCOUNTER — Other Ambulatory Visit (INDEPENDENT_AMBULATORY_CARE_PROVIDER_SITE_OTHER): Payer: Medicare HMO

## 2017-04-22 DIAGNOSIS — M109 Gout, unspecified: Secondary | ICD-10-CM

## 2017-04-22 LAB — BASIC METABOLIC PANEL
BUN: 28 mg/dL — ABNORMAL HIGH (ref 6–23)
CO2: 29 mEq/L (ref 19–32)
Calcium: 10.4 mg/dL (ref 8.4–10.5)
Chloride: 103 mEq/L (ref 96–112)
Creatinine, Ser: 1.77 mg/dL — ABNORMAL HIGH (ref 0.40–1.50)
GFR: 40.53 mL/min — AB (ref >60.00)
Glucose, Bld: 89 mg/dL (ref 70–99)
Potassium: 4.8 mEq/L (ref 3.5–5.1)
SODIUM: 139 meq/L (ref 135–145)

## 2017-04-22 LAB — URIC ACID: URIC ACID, SERUM: 8.9 mg/dL — AB (ref 4.0–7.8)

## 2017-04-23 ENCOUNTER — Other Ambulatory Visit: Payer: Self-pay | Admitting: Family Medicine

## 2017-04-23 DIAGNOSIS — R7989 Other specified abnormal findings of blood chemistry: Secondary | ICD-10-CM

## 2017-04-24 ENCOUNTER — Ambulatory Visit (INDEPENDENT_AMBULATORY_CARE_PROVIDER_SITE_OTHER): Payer: Medicare HMO | Admitting: Family Medicine

## 2017-04-24 ENCOUNTER — Encounter: Payer: Self-pay | Admitting: Family Medicine

## 2017-04-24 DIAGNOSIS — R7989 Other specified abnormal findings of blood chemistry: Secondary | ICD-10-CM | POA: Diagnosis not present

## 2017-04-24 DIAGNOSIS — M109 Gout, unspecified: Secondary | ICD-10-CM

## 2017-04-24 LAB — BASIC METABOLIC PANEL
BUN: 24 mg/dL — ABNORMAL HIGH (ref 6–23)
CALCIUM: 9.8 mg/dL (ref 8.4–10.5)
CHLORIDE: 102 meq/L (ref 96–112)
CO2: 32 meq/L (ref 19–32)
Creatinine, Ser: 1.6 mg/dL — ABNORMAL HIGH (ref 0.40–1.50)
GFR: 45.53 mL/min — ABNORMAL LOW (ref >60.00)
Glucose, Bld: 84 mg/dL (ref 70–99)
POTASSIUM: 4.7 meq/L (ref 3.5–5.1)
SODIUM: 140 meq/L (ref 135–145)

## 2017-04-24 NOTE — Patient Instructions (Signed)
Stay off the spironolactone for now.  Go to the lab on the way out.  We'll contact you with your lab report. We'll be in touch and we'll go from there.  Take care.  Glad to see you.

## 2017-04-24 NOTE — Progress Notes (Signed)
Gout f/u.  Cr up, he held spironolactone today.  See notes on recent labs.  No recent ibuprofen use, none in the last month.  Off valsartan and changed to losartan.  No CP, SOB, BLE edema.    He is back on statin in the meantime but prev aches didn't return. It seems that stopping valsartan and change in losartan help more with the aches. However, he has RLS sx and those seems to be worse back on the atorvastatin- this was temporarily better off the atorvastatin.    Was prev on septra, with diarrhea on med.  Some better after stopping septra.    Meds, vitals, and allergies reviewed.   ROS: Per HPI unless specifically indicated in ROS section   nad ncat Neck supple, no LA rrr ctab abd soft, not ttp, normal BS L thumb bandaged.   Ext w/o edema.

## 2017-04-25 NOTE — Assessment & Plan Note (Signed)
Status post procedure on left thumb. The issue is the remaining creatinine elevation. Recheck labs today. He held his spironolactone in the meantime. Would not start allopurinol yet, until his creatinine is stabilized and improved. Uric acid is elevated, discussed with patient. Would continue with losartan for now. Would continue atorvastatin assuming he can tolerate that.

## 2017-04-28 DIAGNOSIS — H353132 Nonexudative age-related macular degeneration, bilateral, intermediate dry stage: Secondary | ICD-10-CM | POA: Diagnosis not present

## 2017-04-28 DIAGNOSIS — H40013 Open angle with borderline findings, low risk, bilateral: Secondary | ICD-10-CM | POA: Diagnosis not present

## 2017-04-28 DIAGNOSIS — H25813 Combined forms of age-related cataract, bilateral: Secondary | ICD-10-CM | POA: Diagnosis not present

## 2017-04-30 ENCOUNTER — Encounter: Payer: Self-pay | Admitting: Family Medicine

## 2017-04-30 ENCOUNTER — Ambulatory Visit (INDEPENDENT_AMBULATORY_CARE_PROVIDER_SITE_OTHER): Payer: Medicare HMO | Admitting: Family Medicine

## 2017-04-30 VITALS — BP 122/60 | HR 64 | Temp 98.7°F | Wt 186.5 lb

## 2017-04-30 DIAGNOSIS — M109 Gout, unspecified: Secondary | ICD-10-CM | POA: Diagnosis not present

## 2017-04-30 DIAGNOSIS — R197 Diarrhea, unspecified: Secondary | ICD-10-CM | POA: Diagnosis not present

## 2017-04-30 NOTE — Progress Notes (Signed)
He had diarrhea starting while on the septra prev.  Has continued since then.  Variable amounts of diarrhea, but usually 2-4 times per day.    No blood in stool.  No fevers.  No vomiting except for 1 day (about 2 days ago, that resolved in the meantime- vomiting was after eating some tuna that didn't taste right).  Much more gas than usual.  Some mucous in the stool.  He hasn't had a solid stool since he started abx.  Off abx for a few weeks.   He is still off spironolactone.  He is due for f/u BMET soon, d/w pt.  Will collect today.    Meds, vitals, and allergies reviewed.   ROS: Per HPI unless specifically indicated in ROS section   GEN: nad, alert and oriented HEENT: mucous membranes moist NECK: supple w/o LA CV: rrr PULM: ctab, no inc wob ABD: soft, +bs, hyperactive bowel sounds noted, not ttp EXT: no edema

## 2017-04-30 NOTE — Patient Instructions (Signed)
Go to the lab on the way out.  We'll contact you with your lab report. Get the blood labs drawn and get the stool kit.  Start a probiotic in the meantime.  Keep drinking plenty of fluids.  Take care.  Glad to see you.

## 2017-05-01 ENCOUNTER — Other Ambulatory Visit: Payer: Medicare HMO

## 2017-05-01 DIAGNOSIS — R197 Diarrhea, unspecified: Secondary | ICD-10-CM | POA: Diagnosis not present

## 2017-05-01 LAB — BASIC METABOLIC PANEL
BUN: 17 mg/dL (ref 6–23)
CHLORIDE: 103 meq/L (ref 96–112)
CO2: 29 meq/L (ref 19–32)
CREATININE: 1.24 mg/dL (ref 0.40–1.50)
Calcium: 9.3 mg/dL (ref 8.4–10.5)
GFR: 61.1 mL/min (ref >60.00)
GLUCOSE: 81 mg/dL (ref 70–99)
Potassium: 4.1 mEq/L (ref 3.5–5.1)
Sodium: 138 mEq/L (ref 135–145)

## 2017-05-01 NOTE — Assessment & Plan Note (Signed)
The concern is for C diff after antibiotic use. Discussed with patient about handwashing. Check stool lab in the meantime. Nontoxic. Okay for outpatient follow-up. Since he does not have severe symptoms, no abdominal pain, no fever, would not start antibiotics at this point. Discussed with patient. He agrees.

## 2017-05-01 NOTE — Assessment & Plan Note (Signed)
Recheck creatinine today. He is off spironolactone in the meantime. The goal is to normalize his creatinine so we can more aggressively treat his gout with uric acid lowering medication. See notes on labs.

## 2017-05-01 NOTE — Addendum Note (Signed)
Addended by: Ellamae Sia on: 05/01/2017 10:38 AM   Modules accepted: Orders

## 2017-05-02 ENCOUNTER — Encounter: Payer: Self-pay | Admitting: Family Medicine

## 2017-05-02 ENCOUNTER — Ambulatory Visit (INDEPENDENT_AMBULATORY_CARE_PROVIDER_SITE_OTHER): Payer: Medicare HMO | Admitting: Family Medicine

## 2017-05-02 ENCOUNTER — Other Ambulatory Visit: Payer: Self-pay | Admitting: Family Medicine

## 2017-05-02 ENCOUNTER — Telehealth: Payer: Self-pay | Admitting: Family Medicine

## 2017-05-02 VITALS — BP 116/68 | HR 68 | Temp 98.1°F | Wt 187.0 lb

## 2017-05-02 DIAGNOSIS — R197 Diarrhea, unspecified: Secondary | ICD-10-CM

## 2017-05-02 DIAGNOSIS — M109 Gout, unspecified: Secondary | ICD-10-CM

## 2017-05-02 LAB — C. DIFFICILE GDH AND TOXIN A/B
C. DIFFICILE GDH: NOT DETECTED
C. difficile Toxin A/B: NOT DETECTED

## 2017-05-02 MED ORDER — ALLOPURINOL 100 MG PO TABS: 100.0000 mg | ORAL_TABLET | Freq: Every day | ORAL | 3 refills | Status: DC

## 2017-05-02 MED ORDER — METRONIDAZOLE 500 MG PO TABS: 500.0000 mg | ORAL_TABLET | Freq: Three times a day (TID) | ORAL | 0 refills | Status: DC

## 2017-05-02 NOTE — Progress Notes (Signed)
BP 116/68   Pulse 68   Temp 98.1 F (36.7 C) (Oral)   Wt 187 lb (84.8 kg)   SpO2 92%   BMI 28.43 kg/m    CC: diarrhea Subjective:    Patient ID: Alan [name], male    DOB: Dec 29, 1945, 71 y.o.   MRN: [id number]  HPI: SERVANDO KYLLONEN is a 71 y.o. male presenting on 05/02/2017 for Diarrhea (8xs in the last 24hrs. after taking abx)   For last 2-3 wks ongoing diarrhea - up to 6 times a day. Last night was first time BMs affected sleep - with accidents last night. More gassy. Very loose stool.  Treating with imodium.  No abdominal pain, nausea, vomiting, fevers/chills, blood in stool. Stool is not greasy. No color changes, not more malodorous stool. Appetite is good.   No recent travel, no new foods, uses community well water. No sick contacts at home.   Seen by PCP last week and again 04/30/2017, notes reviewed.  Ongoing diarrhea since septra for thumb procedure (to remove tophi) by Dr Fredna Dow in July, now off med. Only took for 7 days.  Labs showing improved kidney function. Tested neg for C diff.  PCP had recommended probiotic - he has been taking vegetable blend of this.   Discussed recent results with patient.   He is staying well hydrated. He is taking yogurt.   Relevant past medical, surgical, family and social history reviewed and updated as indicated. Interim medical history since our last visit reviewed. Allergies and medications reviewed and updated. Outpatient Medications Prior to Visit  Medication Sig Dispense Refill  . allopurinol (ZYLOPRIM) 100 MG tablet Take 1 tablet (100 mg total) by mouth daily. 90 tablet 3  . amLODipine (NORVASC) 10 MG tablet Take 1 tablet (10 mg total) by mouth daily. 90 tablet 3  . aspirin 81 MG tablet Take 81 mg by mouth daily.      Marland Kitchen atorvastatin (LIPITOR) 80 MG tablet Take 0.5 tablets (40 mg total) by mouth daily. 45 tablet 3  . CHERRY PO Take by mouth daily.    . Loperamide HCl (IMODIUM PO) Take by mouth as needed.    . Loratadine  (CLARITIN PO) Take 1 tablet by mouth daily as needed. Take 1 tab by mouth daily as needed    . losartan (COZAAR) 100 MG tablet Take 1 tablet (100 mg total) by mouth daily. 90 tablet 3  . Melatonin-Pyridoxine (MELATIN PO) Take by mouth at bedtime as needed.    . metoprolol tartrate (LOPRESSOR) 100 MG tablet TAKE 1 TABLET TWICE DAILY 180 tablet 3  . metroNIDAZOLE (FLAGYL) 500 MG tablet Take 1 tablet (500 mg total) by mouth 3 (three) times daily. 21 tablet 0  . RaNITidine HCl (RANITIDINE 75 PO) Take 1 tablet by mouth daily as needed. Take 1 tab by mouth daily as needed    . sildenafil (REVATIO) 20 MG tablet Take 5 tablets (100 mg total) by mouth daily as needed. 50 tablet 12  . timolol (BETIMOL) 0.5 % ophthalmic solution 1 drop as directed.       No facility-administered medications prior to visit.      Per HPI unless specifically indicated in ROS section below Review of Systems     Objective:    BP 116/68   Pulse 68   Temp 98.1 F (36.7 C) (Oral)   Wt 187 lb (84.8 kg)   SpO2 92%   BMI 28.43 kg/m   Wt Readings from Last 3 Encounters:  05/02/17 187 lb (84.8 kg)  04/30/17 186 lb 8 oz (84.6 kg)  04/24/17 185 lb 12 oz (84.3 kg)    Physical Exam  Constitutional: He appears well-developed and well-nourished.  HENT:  Mouth/Throat: Oropharynx is clear and moist. No oropharyngeal exudate.  Cardiovascular: Normal rate, regular rhythm, normal heart sounds and intact distal pulses.   No murmur heard. Pulmonary/Chest: Effort normal and breath sounds normal. No respiratory distress. He has no wheezes. He has no rales.  Abdominal: Soft. Normal appearance and bowel sounds are normal. He exhibits no distension and no mass. There is no hepatosplenomegaly. There is no tenderness. There is no rigidity, no rebound, no guarding, no CVA tenderness and negative Murphy's sign.  Musculoskeletal: He exhibits no edema.  Skin: Skin is warm and dry. No rash noted.  Psychiatric: He has a normal mood and  affect.  Nursing note and vitals reviewed.  Results for orders placed or performed in visit on 07/12/24  Basic Metabolic Panel  Result Value Ref Range   Sodium 138 135 - 145 mEq/L   Potassium 4.1 3.5 - 5.1 mEq/L   Chloride 103 96 - 112 mEq/L   CO2 29 19 - 32 mEq/L   Glucose, Bld 81 70 - 99 mg/dL   BUN 17 6 - 23 mg/dL   Creatinine, Ser 1.24 0.40 - 1.50 mg/dL   Calcium 9.3 8.4 - 10.5 mg/dL   GFR 61.10 >60.00 mL/min  C. difficile GDH and Toxin A/B  Result Value Ref Range   C. difficile GDH Not Detected    C. difficile Toxin A/B Not Detected    Source Stool       Assessment & Plan:   Problem List Items Addressed This Visit    Diarrhea - Primary    Ongoing diarrhea which started after few days of bactrim - anticipate abx associated diarrhea. Recent C diff testing was negative. Cr was back to normal. Overall benign exam.  Recommended imodium 1 tab daily. rec switch from vegetable blend probiotic to culturelle or activia or phillips colon health. Reviewed bland low fiber diet.  If not improving with this, he will start flagyl abx sent in by PCP. Pt agrees with plan.  Not consistent with pancreatic insufficiency diarrhea.           Follow up plan: Return if symptoms worsen or fail to improve.  Ria Bush, MD

## 2017-05-02 NOTE — Addendum Note (Signed)
Addended by: Ria Bush on: 05/02/2017 01:18 PM   Modules accepted: Level of Service

## 2017-05-02 NOTE — Assessment & Plan Note (Signed)
Ongoing diarrhea which started after few days of bactrim - anticipate abx associated diarrhea. Recent C diff testing was negative. Cr was back to normal. Overall benign exam.  Recommended imodium 1 tab daily. rec switch from vegetable blend probiotic to culturelle or activia or phillips colon health. Reviewed bland low fiber diet.  If not improving with this, he will start flagyl abx sent in by PCP. Pt agrees with plan.  Not consistent with pancreatic insufficiency diarrhea.

## 2017-05-02 NOTE — Telephone Encounter (Signed)
Pt has appt to see Dr Danise Mina on 05/02/17 at 12 noon.

## 2017-05-02 NOTE — Telephone Encounter (Signed)
[place] Patient Name: Alan [name] DOB: 13-Nov-1945 Initial Comment Caller says , husband stool sample was delivered yesterday, he was up all night last night with diarrhea. He took Imodium today. Nurse Assessment Nurse: Yolanda Bonine, RN, Erin Date/Time (Eastern Time): 05/02/2017 8:49:22 AM Confirm and document reason for call. If symptomatic, describe symptoms. ---caller states her husband is having diarrhea. This is has been going on 3-4 week of diarrhea. Yesterday was very loose but not watery. Does the patient have any new or worsening symptoms? ---Yes Will a triage be completed? ---Yes Related visit to physician within the last 2 weeks? ---Yes Does the PT have any chronic conditions? (i.e. diabetes, asthma, etc.) ---Yes List chronic conditions. ---CABG 10 yrs ago HTN Is this a behavioral health or substance abuse call? ---No Guidelines Guideline Title Affirmed Question Affirmed Notes Diarrhea [1] SEVERE diarrhea (e.g., 7 or more times / day more than normal) AND [2] age > 60 years Final Disposition User See Physician within 4 Hours (or PCP triage) Yolanda Bonine, RN, Erin Comments Lab and stool sample was done this week Recent gout in Thumb on left hand. Was on ABX Appt with Dr Eldred Manges at [facility] PCP OFFICE Disagree/Comply: Comply

## 2017-05-02 NOTE — Patient Instructions (Addendum)
I think this is still upset stomach from the septra.  Take phillips colon health or culturelle or activia probiotic. Hold the vegetable blend one for now. Low fiber diet for now - bland diet - bananas, apple sauce, toast, rice. Exclude gas producing foods (beans, onions, celery, carrots, raisins, bananas, apricots, prunes, brussel sprouts, wheat germ, pretzels) Lactose free diet for next 1-2 weeks (back off milk and diary) Take imodium daily with lunch.

## 2017-05-15 ENCOUNTER — Encounter: Payer: Self-pay | Admitting: Family Medicine

## 2017-05-15 ENCOUNTER — Ambulatory Visit (INDEPENDENT_AMBULATORY_CARE_PROVIDER_SITE_OTHER): Payer: Medicare HMO | Admitting: Family Medicine

## 2017-05-15 ENCOUNTER — Telehealth: Payer: Self-pay | Admitting: *Deleted

## 2017-05-15 VITALS — BP 122/58 | HR 64 | Temp 98.3°F | Wt 180.5 lb

## 2017-05-15 DIAGNOSIS — R197 Diarrhea, unspecified: Secondary | ICD-10-CM

## 2017-05-15 LAB — CBC WITH DIFFERENTIAL/PLATELET
Basophils Absolute: 0 10*3/uL (ref 0.0–0.1)
Basophils Relative: 0.3 % (ref 0.0–3.0)
EOS PCT: 0.9 % (ref 0.0–5.0)
Eosinophils Absolute: 0.1 10*3/uL (ref 0.0–0.7)
HCT: 34.2 % — ABNORMAL LOW (ref 39.0–52.0)
HEMOGLOBIN: 11.9 g/dL — AB (ref 13.0–17.0)
Lymphocytes Relative: 17.1 % (ref 12.0–46.0)
Lymphs Abs: 1.2 10*3/uL (ref 0.7–4.0)
MCHC: 34.7 g/dL (ref 30.0–36.0)
MCV: 90.2 fl (ref 78.0–100.0)
MONO ABS: 0.8 10*3/uL (ref 0.1–1.0)
Monocytes Relative: 10.9 % (ref 3.0–12.0)
Neutro Abs: 5.1 10*3/uL (ref 1.4–7.7)
Neutrophils Relative %: 70.8 % (ref 43.0–77.0)
Platelets: 221 10*3/uL (ref 150.0–400.0)
RBC: 3.79 Mil/uL — AB (ref 4.22–5.81)
RDW: 13.3 % (ref 11.5–15.5)
WBC: 7.2 10*3/uL (ref 4.0–10.5)

## 2017-05-15 LAB — COMPREHENSIVE METABOLIC PANEL
ALBUMIN: 3.9 g/dL (ref 3.5–5.2)
ALK PHOS: 72 U/L (ref 39–117)
ALT: 19 U/L (ref 0–53)
AST: 14 U/L (ref 0–37)
BILIRUBIN TOTAL: 0.8 mg/dL (ref 0.2–1.2)
BUN: 27 mg/dL — AB (ref 6–23)
CO2: 30 mEq/L (ref 19–32)
CREATININE: 2.58 mg/dL — AB (ref 0.40–1.50)
Calcium: 9.1 mg/dL (ref 8.4–10.5)
Chloride: 104 mEq/L (ref 96–112)
GFR: 26.23 mL/min — ABNORMAL LOW (ref >60.00)
Glucose, Bld: 92 mg/dL (ref 70–99)
POTASSIUM: 3.1 meq/L — AB (ref 3.5–5.1)
SODIUM: 143 meq/L (ref 135–145)
TOTAL PROTEIN: 7.1 g/dL (ref 6.0–8.3)

## 2017-05-15 NOTE — Telephone Encounter (Signed)
eroneous encounter

## 2017-05-15 NOTE — Progress Notes (Signed)
F/u for diarrhea. No diarrhea or BMs for 2 days but still feels diffusely weak.  He didn't have to start flagyl in the meantime.  Weight loss noted, presumed to be from diarrhea.  No blood in stool.    He doesn't tolerate milk in the meantime, this is a relative change with the illness.    No abd pain.  No fevers.  Has been taking imodium.   He isn't lightheaded.    His wife has been sick recently.    He isn't on allopurinol yet.    Meds, vitals, and allergies reviewed.   ROS: Per HPI unless specifically indicated in ROS section   GEN: nad, alert and oriented HEENT: mucous membranes moist NECK: supple w/o LA CV: rrr.   PULM: ctab, no inc wob ABD: soft, +bs, not ttp, no rebound EXT: no edema SKIN: no acute rash

## 2017-05-15 NOTE — Assessment & Plan Note (Signed)
Some better now, may have been non-C diff but still abx related diarrhea with transient lactose intolerance. With now diarrhea now and normal abd exam, would check cmet and cbc today.  Hold C diff stool test for now.  If more diarrhea, then collect a sample and start flagyl.  Avoid dairy for now.  If not better, likely will need need imaging and/or GI referral.  D/w pt.  He agrees.

## 2017-05-15 NOTE — Patient Instructions (Signed)
Go to the lab on the way out.  We'll contact you with your lab report. Check cmet and cbc today.  Hold C diff stool test for now.  If more diarrhea, then collect a sample and start flagyl.  Avoid dairy for now.  If not better, likely will need need imaging and/or GI referral.  Take care.  Glad to see you.

## 2017-05-16 ENCOUNTER — Encounter: Payer: Self-pay | Admitting: Family Medicine

## 2017-05-16 ENCOUNTER — Ambulatory Visit (INDEPENDENT_AMBULATORY_CARE_PROVIDER_SITE_OTHER): Payer: Medicare HMO | Admitting: Family Medicine

## 2017-05-16 ENCOUNTER — Telehealth: Payer: Self-pay | Admitting: Family Medicine

## 2017-05-16 VITALS — BP 122/58 | HR 57 | Temp 98.4°F | Wt 180.5 lb

## 2017-05-16 DIAGNOSIS — D649 Anemia, unspecified: Secondary | ICD-10-CM | POA: Diagnosis not present

## 2017-05-16 DIAGNOSIS — R7989 Other specified abnormal findings of blood chemistry: Secondary | ICD-10-CM

## 2017-05-16 DIAGNOSIS — D509 Iron deficiency anemia, unspecified: Secondary | ICD-10-CM

## 2017-05-16 LAB — CBC WITH DIFFERENTIAL/PLATELET
BASOS PCT: 0.4 % (ref 0.0–3.0)
Basophils Absolute: 0 10*3/uL (ref 0.0–0.1)
EOS ABS: 0.1 10*3/uL (ref 0.0–0.7)
Eosinophils Relative: 1.5 % (ref 0.0–5.0)
HCT: 34.8 % — ABNORMAL LOW (ref 39.0–52.0)
Hemoglobin: 12.1 g/dL — ABNORMAL LOW (ref 13.0–17.0)
Lymphocytes Relative: 20.2 % (ref 12.0–46.0)
Lymphs Abs: 1.5 10*3/uL (ref 0.7–4.0)
MCHC: 34.8 g/dL (ref 30.0–36.0)
MCV: 90.7 fl (ref 78.0–100.0)
MONO ABS: 0.7 10*3/uL (ref 0.1–1.0)
Monocytes Relative: 9.9 % (ref 3.0–12.0)
Neutro Abs: 4.9 10*3/uL (ref 1.4–7.7)
Neutrophils Relative %: 68 % (ref 43.0–77.0)
PLATELETS: 240 10*3/uL (ref 150.0–400.0)
RBC: 3.83 Mil/uL — ABNORMAL LOW (ref 4.22–5.81)
RDW: 13.3 % (ref 11.5–15.5)
WBC: 7.3 10*3/uL (ref 4.0–10.5)

## 2017-05-16 LAB — POC URINALSYSI DIPSTICK (AUTOMATED)
BILIRUBIN UA: NEGATIVE
Blood, UA: NEGATIVE
Glucose, UA: NEGATIVE
KETONES UA: NEGATIVE
LEUKOCYTES UA: NEGATIVE
NITRITE UA: NEGATIVE
PROTEIN UA: NEGATIVE
Spec Grav, UA: 1.025 (ref 1.010–1.025)
Urobilinogen, UA: 0.2 E.U./dL
pH, UA: 6 (ref 5.0–8.0)

## 2017-05-16 LAB — BASIC METABOLIC PANEL
BUN: 27 mg/dL — ABNORMAL HIGH (ref 6–23)
CHLORIDE: 102 meq/L (ref 96–112)
CO2: 30 meq/L (ref 19–32)
CREATININE: 2.01 mg/dL — AB (ref 0.40–1.50)
Calcium: 9.1 mg/dL (ref 8.4–10.5)
GFR: 34.99 mL/min — ABNORMAL LOW (ref >60.00)
GLUCOSE: 99 mg/dL (ref 70–99)
Potassium: 2.9 mEq/L — ABNORMAL LOW (ref 3.5–5.1)
Sodium: 141 mEq/L (ref 135–145)

## 2017-05-16 LAB — IBC PANEL
Iron: 28 ug/dL — ABNORMAL LOW (ref 42–165)
Saturation Ratios: 10.1 % — ABNORMAL LOW (ref 20.0–50.0)
TRANSFERRIN: 199 mg/dL — AB (ref 212.0–360.0)

## 2017-05-16 LAB — VITAMIN B12: Vitamin B-12: 279 pg/mL (ref 211–911)

## 2017-05-16 LAB — FOLATE: Folate: 13.3 ng/mL (ref >5.9)

## 2017-05-16 MED ORDER — POTASSIUM CHLORIDE CRYS ER 20 MEQ PO TBCR: 20.0000 meq | EXTENDED_RELEASE_TABLET | Freq: Every day | ORAL | 0 refills | Status: DC

## 2017-05-16 NOTE — Patient Instructions (Signed)
Go to the lab on the way out.  We'll contact you with your lab report. Stay off losartan.  Drink a lot of water.  We'll be in touch.  If you feel worse then go to the ER.  Take care.  Glad to see you.

## 2017-05-16 NOTE — Telephone Encounter (Signed)
Please call patient when lab results are received.

## 2017-05-16 NOTE — Progress Notes (Signed)
He feels weak diffusely but not worse than yesterday.  Off ARB in the meantime.  No more diarrhea, no NSAIDS.  Labs d/w pt.    He wasn't sleeping well at night and that many contribute to the feeling of diffuse weakness.  He did sleep better last night.    No FCNAVD.  No blood in urine.    Meds, vitals, and allergies reviewed.   ROS: Per HPI unless specifically indicated in ROS section   GEN: nad, alert and oriented HEENT: mucous membranes moist NECK: supple w/o LA CV: rrr.  no murmur PULM: ctab, no inc wob ABD: soft, +bs EXT: no edema

## 2017-05-16 NOTE — Telephone Encounter (Signed)
Patient advised.  Lab appt scheduled.  

## 2017-05-16 NOTE — Telephone Encounter (Signed)
Call pt.  Cr is better.  This is great news.  Plan on recheck labs Tuesday.  Lab visit okay, OV if patient desires.   Stay off losartan for now.  Continue fluids, avoid nsaids.    K is low.  Needs to take 63mEq of K daily for 3 days.   rx sent    Hgb isn't worse but iron level is low.  We need to address that.  Needs to see GI- we need to rule out GI loss.  I put in the referral.    Thanks.

## 2017-05-19 DIAGNOSIS — R7989 Other specified abnormal findings of blood chemistry: Secondary | ICD-10-CM | POA: Insufficient documentation

## 2017-05-19 NOTE — Assessment & Plan Note (Signed)
See notes on labs.   Stay off losartan.  Drink a lot of water.  If he feels worse then go to the ER.  Likely combination of ARB use along with diarrhea and relative dehydration. ============================== Addendum- patient was notified. Cr is better.    Plan on recheck labs Tuesday.  Lab visit okay, OV if patient desires.   Stay off losartan for now.  Continue fluids, avoid nsaids.    K is low.  Needs to take 40mEq of K daily for 3 days.   rx sent    Hgb isn't worse but iron level is low.  We need to address that.  Needs to see GI- we need to rule out GI loss.  I put in the referral to GI.

## 2017-05-20 ENCOUNTER — Other Ambulatory Visit (INDEPENDENT_AMBULATORY_CARE_PROVIDER_SITE_OTHER): Payer: Medicare HMO

## 2017-05-20 DIAGNOSIS — R7989 Other specified abnormal findings of blood chemistry: Secondary | ICD-10-CM

## 2017-05-20 LAB — BASIC METABOLIC PANEL WITH GFR
BUN: 11 mg/dL (ref 6–23)
CO2: 29 meq/L (ref 19–32)
Calcium: 8.5 mg/dL (ref 8.4–10.5)
Chloride: 103 meq/L (ref 96–112)
Creatinine, Ser: 1.21 mg/dL (ref 0.40–1.50)
GFR: 62.84 mL/min
Glucose, Bld: 92 mg/dL (ref 70–99)
Potassium: 3.1 meq/L — ABNORMAL LOW (ref 3.5–5.1)
Sodium: 142 meq/L (ref 135–145)

## 2017-05-20 NOTE — Addendum Note (Signed)
Addended by: Ellamae Sia on: 05/20/2017 03:01 PM   Modules accepted: Orders

## 2017-05-22 ENCOUNTER — Other Ambulatory Visit: Payer: Self-pay | Admitting: Podiatry

## 2017-05-22 ENCOUNTER — Ambulatory Visit (INDEPENDENT_AMBULATORY_CARE_PROVIDER_SITE_OTHER): Payer: Medicare HMO | Admitting: Podiatry

## 2017-05-22 ENCOUNTER — Encounter: Payer: Self-pay | Admitting: Podiatry

## 2017-05-22 ENCOUNTER — Telehealth: Payer: Self-pay | Admitting: Family Medicine

## 2017-05-22 ENCOUNTER — Ambulatory Visit (INDEPENDENT_AMBULATORY_CARE_PROVIDER_SITE_OTHER): Payer: Medicare HMO

## 2017-05-22 DIAGNOSIS — M779 Enthesopathy, unspecified: Secondary | ICD-10-CM

## 2017-05-22 DIAGNOSIS — M79671 Pain in right foot: Secondary | ICD-10-CM

## 2017-05-22 MED ORDER — TRIAMCINOLONE ACETONIDE 10 MG/ML IJ SUSP
10.0000 mg | Freq: Once | INTRAMUSCULAR | Status: AC
  Administered 2017-05-22: 10 mg

## 2017-05-22 NOTE — Telephone Encounter (Signed)
[facility]  Patient Name: Alan [name]  DOB: 1946-09-11    Initial Comment Caller states, he was on hp pressure rx. he has an appt with foot dr. this morning at 9:15am - shot for inflammation.    Nurse Assessment  Nurse: Harlow Mares, RN, Suanne Marker Date/Time (Eastern Time): 05/22/2017 8:54:39 AM  Confirm and document reason for call. If symptomatic, describe symptoms. ---Caller states, he was on hp pressure rx.but was discontinued last week. He has an appt with foot dr. this morning at 9:15am - shot for inflammation. Reports that he has swelling in his foot. He is worried that the cortisone injection will run his BP up.  Does the patient have any new or worsening symptoms? ---No  Please document clinical information provided and list any resource used. ---Caller instructed that he should inform the podiatrist. He has a follow up with Dr. Damita Dunnings tomorrow as well. Advised caller to inform MD of the recent med change and call back for any headache, weakness, etc. or for any new/worsening symptoms.     Guidelines    Guideline Title Affirmed Question Affirmed Notes       Final Disposition User   Clinical Call Harlow Mares, RN, Suanne Marker

## 2017-05-22 NOTE — Telephone Encounter (Signed)
We'll check it tomorrow.  Thanks.

## 2017-05-22 NOTE — Telephone Encounter (Signed)
Pt has appt with Dr Damita Dunnings on 05/23/17 at 2pm.

## 2017-05-22 NOTE — Telephone Encounter (Signed)
Patient advised.

## 2017-05-23 ENCOUNTER — Encounter: Payer: Self-pay | Admitting: Family Medicine

## 2017-05-23 ENCOUNTER — Encounter: Payer: Self-pay | Admitting: Gastroenterology

## 2017-05-23 ENCOUNTER — Ambulatory Visit (INDEPENDENT_AMBULATORY_CARE_PROVIDER_SITE_OTHER): Payer: Medicare HMO | Admitting: Family Medicine

## 2017-05-23 VITALS — BP 122/58 | HR 62 | Temp 98.5°F | Wt 184.2 lb

## 2017-05-23 DIAGNOSIS — R197 Diarrhea, unspecified: Secondary | ICD-10-CM

## 2017-05-23 DIAGNOSIS — I10 Essential (primary) hypertension: Secondary | ICD-10-CM | POA: Diagnosis not present

## 2017-05-23 NOTE — Patient Instructions (Signed)
Go to the lab on the way out.  We'll contact you with your lab report. We'll work on the allopurinol later.  If you get lightheaded at all then cut amlodipine back to 5mg  a day.  If BP is consistently >140/>90 then update me.  Take care.  Glad to see you.

## 2017-05-23 NOTE — Progress Notes (Signed)
No diarrhea in the meantime.  Having normal BMs now.   He did not have a diarrhea stool to collect for the Clostridium difficile testing.  Cr recently back to normal.  No fevers.  No abd pain now.  He is off ARB currently.  Still on 10mg  amlodipine.    He feels better in general.  He isn't taking allopurinol yet.  He isn't lightheaded.  Appetite is back to normal.    No CP, SOB, BLE edema.    Meds, vitals, and allergies reviewed.   ROS: Per HPI unless specifically indicated in ROS section   GEN: nad, alert and oriented HEENT: mucous membranes moist NECK: supple w/o LA CV: rrr.  no murmur PULM: ctab, no inc wob ABD: soft, +bs EXT: Trace BLE edema.  SKIN: no acute rash

## 2017-05-24 ENCOUNTER — Other Ambulatory Visit: Payer: Self-pay | Admitting: Family Medicine

## 2017-05-24 DIAGNOSIS — M109 Gout, unspecified: Secondary | ICD-10-CM

## 2017-05-24 LAB — BASIC METABOLIC PANEL WITH GFR
BUN: 11 mg/dL (ref 7–25)
CALCIUM: 8.6 mg/dL (ref 8.6–10.3)
CO2: 29 mmol/L (ref 20–32)
Chloride: 103 mmol/L (ref 98–110)
Creat: 1.16 mg/dL (ref 0.70–1.18)
GFR, EST AFRICAN AMERICAN: 74 mL/min/{1.73_m2} (ref >=60)
GFR, EST NON AFRICAN AMERICAN: 63 mL/min/{1.73_m2} (ref >=60)
Glucose, Bld: 79 mg/dL (ref 65–99)
POTASSIUM: 3.4 mmol/L — AB (ref 3.5–5.3)
Sodium: 142 mmol/L (ref 135–146)

## 2017-05-24 NOTE — Assessment & Plan Note (Signed)
As best I can tell, he likely was relatively dehydrated from diarrhea. This in combination with ARB use likely elevated his creatinine. He had been able to tolerate hydrochlorothiazide and ARB previously. He is off hydrochlorothiazide now. He is off ARB. Would continue current medications as is, off ARB but on amlodipine 10 mg a day. If he gets lightheaded at all, then decrease amlodipine down to 5 mg a day. Discussed with patient. Recheck routine labs today. He has not yet started allopurinol. We can address this after he has had a period of time with stability and without other complications.

## 2017-05-24 NOTE — Assessment & Plan Note (Signed)
Now resolved. We cancelled the C diff stool test, since he is better, d/w pt.

## 2017-05-27 ENCOUNTER — Telehealth: Payer: Self-pay | Admitting: Family Medicine

## 2017-05-27 NOTE — Telephone Encounter (Signed)
PT returned your call. He is requesting a cb. They will be leaving home around 10 am, can leave msg on cell phone.

## 2017-05-28 NOTE — Progress Notes (Signed)
Subjective:    Patient ID: Alan [name], male   DOB: 71 y.o.   MRN: [id number]   HPI patient states she's developed inflammation again around the first MPJ right and the medication help last time    ROS      Objective:  Physical Exam neurovascular status intact with inflammation fluid around the first MPJ right that's painful when pressed     Assessment:    Inflammatory capsulitis first MPJ right     Plan:   Careful injection first MPJ right 3 mg Kenalog 5 mg Xylocaine advised on wider shoes and reappoint as needed

## 2017-06-05 ENCOUNTER — Ambulatory Visit (INDEPENDENT_AMBULATORY_CARE_PROVIDER_SITE_OTHER): Payer: Medicare HMO | Admitting: Family Medicine

## 2017-06-05 ENCOUNTER — Encounter: Payer: Self-pay | Admitting: Family Medicine

## 2017-06-05 VITALS — BP 122/62 | HR 67 | Temp 97.9°F | Wt 184.5 lb

## 2017-06-05 DIAGNOSIS — Z23 Encounter for immunization: Secondary | ICD-10-CM

## 2017-06-05 DIAGNOSIS — E785 Hyperlipidemia, unspecified: Secondary | ICD-10-CM

## 2017-06-05 NOTE — Patient Instructions (Addendum)
Stop the lipitor for now and update me in about 1 week and we'll go from there.  High likelihood that the statin is contributing to the aches.   Take care.  Glad to see you.  Your BP was good today.  Bring in your cuff to the next visit.

## 2017-06-05 NOTE — Progress Notes (Signed)
No diarrhea but now with daily soft BMs.    He had prev cramps but that seemed to be related to valsartan.  In the meantime, he is back on lipitor and having leg cramps.  Not exertional- he got on a stationary bike and didn't have trouble but felt more soreness afterward.   Annoying when it happens.  Level of cramping and soreness can be variable.  He has a clear reason to be on statin, it tolerated.  No CP.    Not on allopurinol yet, per previous discussions.    Meds, vitals, and allergies reviewed.   ROS: Per HPI unless specifically indicated in ROS section   GEN: nad, alert and oriented HEENT: mucous membranes moist NECK: supple w/o LA CV: rrr.  no murmur PULM: ctab, no inc wob ABD: soft, +bs EXT: trace BLE edema SKIN: no acute rash Intact DP pulses B

## 2017-06-08 NOTE — Assessment & Plan Note (Signed)
Stop the lipitor for now and update me in about 1 week and we'll go from there. High likelihood that the statin is contributing to the aches.  His BP was reasonable today.  Advised patient to bring in his cuff to the next visit.

## 2017-07-17 ENCOUNTER — Encounter: Payer: Self-pay | Admitting: Gastroenterology

## 2017-07-17 ENCOUNTER — Ambulatory Visit (INDEPENDENT_AMBULATORY_CARE_PROVIDER_SITE_OTHER): Payer: Medicare HMO | Admitting: Gastroenterology

## 2017-07-17 ENCOUNTER — Encounter (INDEPENDENT_AMBULATORY_CARE_PROVIDER_SITE_OTHER): Payer: Self-pay

## 2017-07-17 ENCOUNTER — Other Ambulatory Visit (INDEPENDENT_AMBULATORY_CARE_PROVIDER_SITE_OTHER): Payer: Medicare HMO

## 2017-07-17 VITALS — BP 136/70 | HR 64 | Ht 67.0 in | Wt 188.0 lb

## 2017-07-17 DIAGNOSIS — D649 Anemia, unspecified: Secondary | ICD-10-CM

## 2017-07-17 DIAGNOSIS — R197 Diarrhea, unspecified: Secondary | ICD-10-CM | POA: Diagnosis not present

## 2017-07-17 DIAGNOSIS — R634 Abnormal weight loss: Secondary | ICD-10-CM

## 2017-07-17 LAB — CBC WITH DIFFERENTIAL/PLATELET
Basophils Absolute: 0 10*3/uL (ref 0.0–0.1)
Basophils Relative: 0.7 % (ref 0.0–3.0)
EOS PCT: 1 % (ref 0.0–5.0)
Eosinophils Absolute: 0.1 10*3/uL (ref 0.0–0.7)
HEMATOCRIT: 39.2 % (ref 39.0–52.0)
HEMOGLOBIN: 13.3 g/dL (ref 13.0–17.0)
LYMPHS PCT: 21.8 % (ref 12.0–46.0)
Lymphs Abs: 1.5 10*3/uL (ref 0.7–4.0)
MCHC: 34 g/dL (ref 30.0–36.0)
MCV: 90.4 fl (ref 78.0–100.0)
MONO ABS: 0.5 10*3/uL (ref 0.1–1.0)
MONOS PCT: 7.6 % (ref 3.0–12.0)
Neutro Abs: 4.9 10*3/uL (ref 1.4–7.7)
Neutrophils Relative %: 68.9 % (ref 43.0–77.0)
Platelets: 203 10*3/uL (ref 150.0–400.0)
RBC: 4.34 Mil/uL (ref 4.22–5.81)
RDW: 13.9 % (ref 11.5–15.5)
WBC: 7.1 10*3/uL (ref 4.0–10.5)

## 2017-07-17 LAB — IGA: IGA: 158 mg/dL (ref 68–378)

## 2017-07-17 MED ORDER — SUPREP BOWEL PREP KIT 17.5-3.13-1.6 GM/177ML PO SOLN: ORAL | 0 refills | Status: DC

## 2017-07-17 NOTE — Progress Notes (Signed)
HPI :  71 y/o male with a history of CAD, esophageal stricture, HTN, HLD, referred by Dr. Elsie Stain for diarrhea and weight loss Previously followed by Dr. Olevia Perches, he has not been seen in our office since 2012.   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 He reports he is given a course of Bactrim she treat infection and thumb, he thinks around August time frame, when he started this antibiotic he developed severe diarrhea. He states he stopped the antibiotic but his symptoms persisted for a few months. He states his normal baseline is one BM per day, he had upwards of 4 bowel movements a day that were loose and watery with urgency. During this time he developed an acute kidney injury, thought to be due to dehydration potentially. He also endorses lower abdominal pain and borborygmi at the time. As well he noted a roughly 25 pound weight loss since the symptoms began. His last colonoscopy was in 2008 at which time he had a focal colitis of the cecum noted with nonspecific inflammation on biopsies.  He reports about 2-3 weeks ago however, without intervention, his bowel habits of normalized. His loose stools have resolved, he has about 1 bowel movement per day, no blood in his stool, passing brown stools normal form. He has regained about 8 pounds of his weight. Denies any family history of colon cancer. He was noted to have an anemia with hemoglobin of 12.1, MCV of 90. His iron studies were consistent with anemia of chronic disease. He denies any NSAID use.  He otherwise denies any dysphagia or problems eating..  Endoscopic history: EGD 09/25/2010 - stenosis of GEJ, dilated to 29mm, moderate gastritis Colonoscopy 11/26/2006 - focal colitis of the cecum, otherwise normal - nonspecific inflammation noted Colonoscopy 12/30/2005 - diverticulosis, otherwise normal EGD 10/08/00 - distal esophageal stricture,  dilated to 11mm   Past Medical History:  Diagnosis Date  . CAD (coronary artery disease) 07/17/2004  . Diverticulosis of colon 12/15/2005  . Esophageal stricture   . Gastritis   . GERD (gastroesophageal reflux disease) 09/16/1996  . Glaucoma   . Heart disease   . History of tobacco abuse    Quit 2000  . Hx of cardiovascular stress test    ETT-Myoview (01/2014):  1.5 mm ST depression in inf leads at peak exercise; no ischemia or scar, EF 57%, Low Risk  . Hyperlipidemia 03/17/1999  . Hypertension 03/17/1999  . Stress fracture of foot    right midfoot, per Dr. Canary Brim 2012     Past Surgical History:  Procedure Laterality Date  . COLONOSCOPY  12/30/2005   Divertics, mild, 10 yrs  . COLONOSCOPY  11/26/2006   Colitis biopsy neg  . CORONARY ARTERY BYPASS GRAFT  07/27/2004   By Dr. Roxy Manns with a LIMA to the LAD, RIMA to the distal right coronary artery, spahenous vein graft first diagonal, saphenous vein graft to the first circumflex marginal, sequential saphenous vein graft to the second  . ESOPHAGOGASTRODUODENOSCOPY  10/08/2000   Stricture distal esoph, repeat dilation done 2012  . ESOPHAGOGASTRODUODENOSCOPY  10/08/2000   Stricture distal esoph  . I&D EXTREMITY Left 01/24/2017   Procedure: MINOR INCISION AND DRAINAGE LEFT THUMB;  Surgeon: Leanora Cover, MD;  Location: [facility];  Service: Orthopedics;  Laterality: Left;   Family History  Problem Relation Age of Onset  . Peripheral vascular disease Mother   . Stroke  Father        x2  . Hypertension Brother   . Heart attack Brother        MI  . Colon polyps Brother   . Heart disease Brother        MI  . Hypertension Sister   . Breast cancer Sister   . Hypertension Sister   . Hypertension Sister   . Colon polyps Other   . Diabetes Neg Hx   . Depression Neg Hx   . Alcohol abuse Neg Hx   . Drug abuse Neg Hx   . Prostate cancer Neg Hx   . Colon cancer Neg Hx    Social History  Substance Use Topics  . Smoking  status: Former Smoker    Types: Cigarettes    Quit date: 09/16/1998  . Smokeless tobacco: Never Used  . Alcohol use Yes     Comment: occasional beer   Current Outpatient Prescriptions  Medication Sig Dispense Refill  . amLODipine (NORVASC) 10 MG tablet Take 1 tablet (10 mg total) by mouth daily. 90 tablet 3  . aspirin 81 MG tablet Take 81 mg by mouth daily.      Marland Kitchen atorvastatin (LIPITOR) 80 MG tablet Take 0.5 tablets (40 mg total) by mouth daily. 45 tablet 3  . CHERRY PO Take by mouth daily.    . Melatonin-Pyridoxine (MELATIN PO) Take by mouth at bedtime as needed.    . metoprolol tartrate (LOPRESSOR) 100 MG tablet TAKE 1 TABLET TWICE DAILY 180 tablet 3  . sildenafil (REVATIO) 20 MG tablet Take 5 tablets (100 mg total) by mouth daily as needed. 50 tablet 12  . timolol (BETIMOL) 0.5 % ophthalmic solution 1 drop as directed.      Marland Kitchen allopurinol (ZYLOPRIM) 100 MG tablet Take 1 tablet (100 mg total) by mouth daily. (Patient not taking: Reported on 05/23/2017) 90 tablet 3  . Loratadine (CLARITIN PO) Take 1 tablet by mouth daily as needed. Take 1 tab by mouth daily as needed     No current facility-administered medications for this visit.    Allergies  Allergen Reactions  . Septra [Sulfamethoxazole-Trimethoprim]     diarrhea     Review of Systems: All systems reviewed and negative except where noted in HPI.   Lab Results  Component Value Date   WBC 7.1 07/17/2017   HGB 13.3 07/17/2017   HCT 39.2 07/17/2017   MCV 90.4 07/17/2017   PLT 203.0 07/17/2017    Lab Results  Component Value Date   CREATININE 1.16 05/23/2017   BUN 11 05/23/2017   NA 142 05/23/2017   K 3.4 (L) 05/23/2017   CL 103 05/23/2017   CO2 29 05/23/2017    Lab Results  Component Value Date   ALT 19 05/15/2017   AST 14 05/15/2017   ALKPHOS 72 05/15/2017   BILITOT 0.8 05/15/2017   CBC Latest Ref Rng & Units 07/17/2017 05/16/2017 05/15/2017  WBC 4.0 - 10.5 K/uL 7.1 7.3 7.2  Hemoglobin 13.0 - 17.0 g/dL 13.3  12.1(L) 11.9(L)  Hematocrit 39.0 - 52.0 % 39.2 34.8(L) 34.2(L)  Platelets 150.0 - 400.0 K/uL 203.0 240.0 221.0    Physical Exam: BP 136/70 (BP Location: Left Arm, Patient Position: Sitting, Cuff Size: Normal)   Pulse 64   Ht 5\' 7"  (1.702 m) Comment: height measured without shoes  Wt 188 lb (85.3 kg)   BMI 29.44 kg/m  Constitutional: Pleasant,well-developed, male in no acute distress. HEENT: Normocephalic and atraumatic. Conjunctivae are normal. No scleral icterus.  Neck supple.  Cardiovascular: Normal rate, regular rhythm.  Pulmonary/chest: Effort normal and breath sounds normal. No wheezing, rales or rhonchi. Abdominal: Soft, nondistended, nontender. Bowel sounds active throughout. There are no masses palpable. No hepatomegaly. Extremities: no edema Lymphadenopathy: No cervical adenopathy noted. Neurological: Alert and oriented to person place and time. Skin: Skin is warm and dry. No rashes noted. Psychiatric: Normal mood and affect. Behavior is normal.   ASSESSMENT AND PLAN: 71 year old male with history as outlined above, here for new patient evaluation for the following issues:  Diarrhea / weight loss / anemia  - roughly 2-3 months worth of diarrhea which at one point led to dehydration and acute kidney injury, associated with weight loss. Interestingly his symptoms resolved over the past few weeks and is now regaining weight. This time he developed an anemia. Normocytic. His iron studies are consistent with anemia of chronic disease. He did not have any stool testing during this time, possibly had C. difficile given antibiotic use history. His last colonoscopy was over 10 years ago, showing nonspecific colitis of the cecum. While his symptoms have resolved, given the changes of his last colonoscopy and his recent course, and that he is due for colon cancer screening in general, I'm recommending optical colonoscopy, ensure no evidence of IBD. I repeated his CBC today and his anemia has  resolved, MCV normal. Will also screen for celiac disease although this would seem to be unlikely. Following discussion of the risks and benefits of colonoscopy, he wished to proceed. Further recommendations pending the results.  Roseboro Cellar, MD [facility] Pager [phone]  CC: Tonia Ghent, MD

## 2017-07-17 NOTE — Patient Instructions (Signed)
You have been scheduled for a colonoscopy. Please follow written instructions given to you at your visit today.  Please pick up your prep supplies at the pharmacy within the next 1-3 days. If you use inhalers (even only as needed), please bring them with you on the day of your procedure. Your physician has requested that you go to www.startemmi.com and enter the access code given to you at your visit today. This web site gives a general overview about your procedure. However, you should still follow specific instructions given to you by our office regarding your preparation for the procedure.  Your physician has requested that you go to the basement for the following lab work before leaving today: CBC, IGA, TTG  If you are age 60 or older, your body mass index should be between 23-30. Your Body mass index is 29.44 kg/m. If this is out of the aforementioned range listed, please consider follow up with your Primary Care Provider.  If you are age 69 or younger, your body mass index should be between 19-25. Your Body mass index is 29.44 kg/m. If this is out of the aformentioned range listed, please consider follow up with your Primary Care Provider.   Thank you.

## 2017-07-18 ENCOUNTER — Encounter: Payer: Self-pay | Admitting: Gastroenterology

## 2017-07-18 LAB — TISSUE TRANSGLUTAMINASE, IGA: (TTG) AB, IGA: 1 U/mL

## 2017-07-21 ENCOUNTER — Encounter: Payer: Medicare HMO | Admitting: Gastroenterology

## 2017-07-29 DIAGNOSIS — H43813 Vitreous degeneration, bilateral: Secondary | ICD-10-CM | POA: Diagnosis not present

## 2017-07-29 DIAGNOSIS — H353212 Exudative age-related macular degeneration, right eye, with inactive choroidal neovascularization: Secondary | ICD-10-CM | POA: Diagnosis not present

## 2017-07-29 DIAGNOSIS — H35051 Retinal neovascularization, unspecified, right eye: Secondary | ICD-10-CM | POA: Diagnosis not present

## 2017-07-29 DIAGNOSIS — H2513 Age-related nuclear cataract, bilateral: Secondary | ICD-10-CM | POA: Diagnosis not present

## 2017-08-04 ENCOUNTER — Encounter: Payer: Self-pay | Admitting: Gastroenterology

## 2017-08-04 ENCOUNTER — Telehealth: Payer: Self-pay

## 2017-08-04 ENCOUNTER — Ambulatory Visit (AMBULATORY_SURGERY_CENTER): Payer: Medicare HMO | Admitting: Gastroenterology

## 2017-08-04 ENCOUNTER — Other Ambulatory Visit: Payer: Self-pay

## 2017-08-04 ENCOUNTER — Telehealth: Payer: Self-pay | Admitting: Gastroenterology

## 2017-08-04 VITALS — BP 135/68 | HR 63 | Temp 98.4°F | Resp 15 | Ht 67.0 in | Wt 188.0 lb

## 2017-08-04 DIAGNOSIS — I10 Essential (primary) hypertension: Secondary | ICD-10-CM | POA: Diagnosis not present

## 2017-08-04 DIAGNOSIS — D128 Benign neoplasm of rectum: Secondary | ICD-10-CM

## 2017-08-04 DIAGNOSIS — K635 Polyp of colon: Secondary | ICD-10-CM

## 2017-08-04 DIAGNOSIS — Z951 Presence of aortocoronary bypass graft: Secondary | ICD-10-CM | POA: Diagnosis not present

## 2017-08-04 DIAGNOSIS — R197 Diarrhea, unspecified: Secondary | ICD-10-CM | POA: Diagnosis present

## 2017-08-04 DIAGNOSIS — D129 Benign neoplasm of anus and anal canal: Secondary | ICD-10-CM

## 2017-08-04 DIAGNOSIS — D122 Benign neoplasm of ascending colon: Secondary | ICD-10-CM

## 2017-08-04 DIAGNOSIS — I2510 Atherosclerotic heart disease of native coronary artery without angina pectoris: Secondary | ICD-10-CM | POA: Diagnosis not present

## 2017-08-04 MED ORDER — SODIUM CHLORIDE 0.9 % IV SOLN: 500.0000 mL | INTRAVENOUS | Status: DC

## 2017-08-04 NOTE — Progress Notes (Signed)
Called to room to assist during endoscopic procedure.  Patient ID and intended procedure confirmed with present staff. Received instructions for my participation in the procedure from the performing physician.  

## 2017-08-04 NOTE — Progress Notes (Signed)
Pt's states no medical or surgical changes since previsit or office visit. 

## 2017-08-04 NOTE — Progress Notes (Signed)
No problems noted in the recovery room. maw 

## 2017-08-04 NOTE — Progress Notes (Signed)
To recovery, report to RN, VSS. 

## 2017-08-04 NOTE — Telephone Encounter (Signed)
Called patient back and told him to definitely take today's portion of his prep and follow it with the suggested amount of water. Patient understood and agreed. Will be in at 2:30 for his procedure.

## 2017-08-04 NOTE — Op Note (Signed)
[facility] Patient Name: Alan [name] Procedure Date: 08/04/2017 3:25 PM MRN: [id number] Endoscopist: Remo Lipps P.  MD, MD Age: 71 Referring MD:  Date of Birth: [birth year] Gender: Male Account #: [id number] Procedure:                Colonoscopy Indications:              Clinically significant diarrhea of unexplained                            origin - history of nonspecific colitis on remote                            colonoscopy, weight loss, symptoms have intervally                            improved without intervention Medicines:                Monitored Anesthesia Care Procedure:                Pre-Anesthesia Assessment:                           - Prior to the procedure, a History and Physical                            was performed, and patient medications and                            allergies were reviewed. The patient's tolerance of                            previous anesthesia was also reviewed. The risks                            and benefits of the procedure and the sedation                            options and risks were discussed with the patient.                            All questions were answered, and informed consent                            was obtained. Prior Anticoagulants: The patient has                            taken no previous anticoagulant or antiplatelet                            agents. ASA Grade Assessment: II - A patient with                            mild systemic disease. After reviewing the risks  and benefits, the patient was deemed in                            satisfactory condition to undergo the procedure.                           After obtaining informed consent, the colonoscope                            was passed under direct vision. Throughout the                            procedure, the patient's blood pressure, pulse, and                            oxygen saturations were  monitored continuously. The                            Colonoscope was introduced through the anus and                            advanced to the the terminal ileum, with                            identification of the appendiceal orifice and IC                            valve. The colonoscopy was performed without                            difficulty. The patient tolerated the procedure                            well. The quality of the bowel preparation was                            good. The terminal ileum, ileocecal valve,                            appendiceal orifice, and rectum were photographed. Scope In: 3:41:13 PM Scope Out: 3:59:16 PM Scope Withdrawal Time: 0 hours 16 minutes 20 seconds  Total Procedure Duration: 0 hours 18 minutes 3 seconds  Findings:                 The perianal and digital rectal examinations were                            normal.                           The terminal ileum appeared normal.                           A 5 mm polyp was found in the ascending colon. The  polyp was sessile. The polyp was removed with a                            cold snare. Resection and retrieval were complete.                           A diminutive polyp was found in the rectum. The                            polyp was sessile. The polyp was removed with a                            cold biopsy forceps. Resection and retrieval were                            complete.                           A few small-mouthed diverticula were found in the                            sigmoid colon.                           Internal hemorrhoids were found during                            retroflexion. The hemorrhoids were small.                           The exam was otherwise without abnormality. No                            inflammatory changes noted.                           Biopsies for histology were taken with a cold                             forceps from the right colon, left colon and                            transverse colon for evaluation of microscopic                            colitis. Complications:            No immediate complications. Estimated blood loss:                            Minimal. Estimated Blood Loss:     Estimated blood loss was minimal. Impression:               - The examined portion of the ileum was normal.                           -  One 5 mm polyp in the ascending colon, removed                            with a cold snare. Resected and retrieved.                           - One diminutive polyp in the rectum, removed with                            a cold biopsy forceps. Resected and retrieved.                           - Diverticulosis in the sigmoid colon.                           - Internal hemorrhoids.                           - The examination was otherwise normal. No                            inflammatory changes noted.                           - Biopsies were taken with a cold forceps from the                            right colon, left colon and transverse colon for                            evaluation of microscopic colitis. Recommendation:           - Patient has a contact number available for                            emergencies. The signs and symptoms of potential                            delayed complications were discussed with the                            patient. Return to normal activities tomorrow.                            Written discharge instructions were provided to the                            patient.                           - Resume previous diet.                           - Continue present medications.                           -  Await pathology results.                           - Repeat colonoscopy is recommended for                            surveillance. The colonoscopy date will be                            determined after pathology results  from today's                            exam become available for review.                           - No ibuprofen, naproxen, or other non-steroidal                            anti-inflammatory drugs for 2 weeks after polyp                            removal. Remo Lipps P.  MD, MD 08/04/2017 4:04:53 PM This report has been signed electronically.

## 2017-08-04 NOTE — Patient Instructions (Signed)
YOU HAD AN ENDOSCOPIC PROCEDURE TODAY AT [facility]:   Refer to the procedure report that was given to you for any specific questions about what was found during the examination.  If the procedure report does not answer your questions, please call your gastroenterologist to clarify.  If you requested that your care partner not be given the details of your procedure findings, then the procedure report has been included in a sealed envelope for you to review at your convenience later.  YOU SHOULD EXPECT: Some feelings of bloating in the abdomen. Passage of more gas than usual.  Walking can help get rid of the air that was put into your GI tract during the procedure and reduce the bloating. If you had a lower endoscopy (such as a colonoscopy or flexible sigmoidoscopy) you may notice spotting of blood in your stool or on the toilet paper. If you underwent a bowel prep for your procedure, you may not have a normal bowel movement for a few days.  Please Note:  You might notice some irritation and congestion in your nose or some drainage.  This is from the oxygen used during your procedure.  There is no need for concern and it should clear up in a day or so.  SYMPTOMS TO REPORT IMMEDIATELY:   Following lower endoscopy (colonoscopy or flexible sigmoidoscopy):  Excessive amounts of blood in the stool  Significant tenderness or worsening of abdominal pains  Swelling of the abdomen that is new, acute  Fever of 100F or higher   For urgent or emergent issues, a gastroenterologist can be reached at any hour by calling [phone].   DIET:  We do recommend a small meal at first, but then you may proceed to your regular diet.  Drink plenty of fluids but you should avoid alcoholic beverages for 24 hours.  ACTIVITY:  You should plan to take it easy for the rest of today and you should NOT DRIVE or use heavy machinery until tomorrow (because of the sedation medicines used during the test).     FOLLOW UP: Our staff will call the number listed on your records the next business day following your procedure to check on you and address any questions or concerns that you may have regarding the information given to you following your procedure. If we do not reach you, we will leave a message.  However, if you are feeling well and you are not experiencing any problems, there is no need to return our call.  We will assume that you have returned to your regular daily activities without incident.  If any biopsies were taken you will be contacted by phone or by letter within the next 1-3 weeks.  Please call us at [phone] if you have not heard about the biopsies in 3 weeks.    SIGNATURES/CONFIDENTIALITY: You and/or your care partner have signed paperwork which will be entered into your electronic medical record.  These signatures attest to the fact that that the information above on your After Visit Summary has been reviewed and is understood.  Full responsibility of the confidentiality of this discharge information lies with you and/or your care-partner.    Handouts were given to your care partner on polyps and diverticulosis. NO ASPIRIN, ASPIRIN CONTAINING PRODUCTS (BC OR GOODY POWDERS) OR NSAIDS (IBUPROFEN, ADVIL, ALEVE, AND MOTRIN) FOR 2 weeks; TYLENOL IS OK TO TAKE. You may resume your other current medications today. Await biopsy results. Please call if any questions or concerns.

## 2017-08-04 NOTE — Telephone Encounter (Signed)
Patient wife states patient has not stopped going to the restroom since taking prep last night, and wants to know if he needs to tak second half. Pt procedure today at 3:30pm

## 2017-08-05 ENCOUNTER — Telehealth: Payer: Self-pay | Admitting: *Deleted

## 2017-08-05 NOTE — Telephone Encounter (Signed)
  Follow up Call-  Call back number 08/04/2017  Post procedure Call Back phone  # [phone]  Permission to leave phone message Yes  Some recent data might be hidden     Patient questions:  Do you have a fever, pain , or abdominal swelling? No. Pain Score  0 *  Have you tolerated food without any problems? Yes.    Have you been able to return to your normal activities? Yes.    Do you have any questions about your discharge instructions: Diet   No. Medications  No. Follow up visit  No.  Do you have questions or concerns about your Care? No.  Actions: * If pain score is 4 or above: No action needed, pain <4.

## 2017-08-14 ENCOUNTER — Encounter: Payer: Self-pay | Admitting: Gastroenterology

## 2017-08-19 DIAGNOSIS — H40033 Anatomical narrow angle, bilateral: Secondary | ICD-10-CM | POA: Diagnosis not present

## 2017-08-19 DIAGNOSIS — H25013 Cortical age-related cataract, bilateral: Secondary | ICD-10-CM | POA: Diagnosis not present

## 2017-08-19 DIAGNOSIS — H2513 Age-related nuclear cataract, bilateral: Secondary | ICD-10-CM | POA: Diagnosis not present

## 2017-08-19 DIAGNOSIS — H40013 Open angle with borderline findings, low risk, bilateral: Secondary | ICD-10-CM | POA: Diagnosis not present

## 2017-08-26 ENCOUNTER — Ambulatory Visit: Payer: Medicare HMO

## 2017-08-27 ENCOUNTER — Other Ambulatory Visit: Payer: Medicare HMO

## 2017-08-27 ENCOUNTER — Other Ambulatory Visit (INDEPENDENT_AMBULATORY_CARE_PROVIDER_SITE_OTHER): Payer: Medicare HMO

## 2017-08-27 DIAGNOSIS — M109 Gout, unspecified: Secondary | ICD-10-CM

## 2017-08-27 LAB — BASIC METABOLIC PANEL
BUN: 11 mg/dL (ref 6–23)
CHLORIDE: 101 meq/L (ref 96–112)
CO2: 29 meq/L (ref 19–32)
Calcium: 9.9 mg/dL (ref 8.4–10.5)
Creatinine, Ser: 0.88 mg/dL (ref 0.40–1.50)
GFR: 90.68 mL/min (ref >60.00)
GLUCOSE: 103 mg/dL — AB (ref 70–99)
POTASSIUM: 3.8 meq/L (ref 3.5–5.1)
Sodium: 139 mEq/L (ref 135–145)

## 2017-08-27 LAB — URIC ACID: Uric Acid, Serum: 6.4 mg/dL (ref 4.0–7.8)

## 2017-08-29 ENCOUNTER — Encounter: Payer: Self-pay | Admitting: Family Medicine

## 2017-08-29 ENCOUNTER — Ambulatory Visit (INDEPENDENT_AMBULATORY_CARE_PROVIDER_SITE_OTHER): Payer: Medicare HMO | Admitting: Family Medicine

## 2017-08-29 VITALS — BP 144/74 | HR 60 | Temp 97.7°F | Ht 67.0 in | Wt 186.5 lb

## 2017-08-29 DIAGNOSIS — M109 Gout, unspecified: Secondary | ICD-10-CM

## 2017-08-29 DIAGNOSIS — Z951 Presence of aortocoronary bypass graft: Secondary | ICD-10-CM

## 2017-08-29 DIAGNOSIS — Z0000 Encounter for general adult medical examination without abnormal findings: Secondary | ICD-10-CM | POA: Diagnosis not present

## 2017-08-29 DIAGNOSIS — Z7189 Other specified counseling: Secondary | ICD-10-CM

## 2017-08-29 MED ORDER — AMLODIPINE BESYLATE 10 MG PO TABS: 10.0000 mg | ORAL_TABLET | Freq: Every day | ORAL | 3 refills | Status: DC

## 2017-08-29 MED ORDER — SILDENAFIL CITRATE 20 MG PO TABS: 100.0000 mg | ORAL_TABLET | Freq: Every day | ORAL | 12 refills | Status: DC | PRN

## 2017-08-29 MED ORDER — ALLOPURINOL 100 MG PO TABS: 100.0000 mg | ORAL_TABLET | Freq: Every day | ORAL | 3 refills | Status: DC

## 2017-08-29 MED ORDER — ATORVASTATIN CALCIUM 80 MG PO TABS: 40.0000 mg | ORAL_TABLET | Freq: Every day | ORAL | 3 refills | Status: DC

## 2017-08-29 MED ORDER — ALLOPURINOL 100 MG PO TABS: 100.0000 mg | ORAL_TABLET | Freq: Every day | ORAL | Status: DC

## 2017-08-29 NOTE — Patient Instructions (Signed)
Your uric acid was fine.  Continue as is.  If your BP is consistently >140/>90, then let me know.   Take care.  Glad to see you.  Update me as needed.

## 2017-08-29 NOTE — Progress Notes (Signed)
I have personally reviewed the Medicare Annual Wellness questionnaire and have noted 1. The patient's medical and social history 2. Their use of alcohol, tobacco or illicit drugs 3. Their current medications and supplements 4. The patient's functional ability including ADL's, fall risks, home safety risks and hearing or visual             impairment. 5. Diet and physical activities 6. Evidence for depression or mood disorders  The patients weight, height, BMI have been recorded in the chart and visual acuity is per eye clinic.  I have made referrals, counseling and provided education to the patient based review of the above and I have provided the pt with a written personalized care plan for preventive services.  Provider list updated- see scanned forms.  Routine anticipatory guidance given to patient.  See health maintenance. The possibility exists that previously documented standard health maintenance information may have been brought forward from a previous encounter into this note.  If needed, that same information has been updated to reflect the current situation based on today's encounter.    Flu 2018 Shingles 2013 PNA 2015 Tetanus 2010 Colonoscopy 2018 Prostate cancer screening and PSA options (with potential risks and benefits of testing vs not testing) were discussed along with recent recs/guidelines.  He declined testing PSA at this point. Advance directive- wife designated if patient were incapacitated.   Cognitive function addressed- see scanned forms- and if abnormal then additional documentation follows.  HCV screening prev neg  Gout.  On allopurinol now.  Labs d/w pt.  No ADE on med.  Uric acid at goal.    No OSA but some insomnia.  Melatonin helps.  D/w pt.  He can continue as is and update me as needed.   CAD s/p CABG.  He has routine yearly f/u with cards.    Using medication without problems or lightheadedness: yes Chest pain with exertion:no Edema:no Short of  breath:no  PMH and SH reviewed  Meds, vitals, and allergies reviewed.   ROS: Per HPI.  Unless specifically indicated otherwise in HPI, the patient denies:  General: fever. Eyes: acute vision changes ENT: sore throat Cardiovascular: chest pain Respiratory: SOB GI: vomiting GU: dysuria Musculoskeletal: acute back pain Derm: acute rash Neuro: acute motor dysfunction Psych: worsening mood Endocrine: polydipsia Heme: bleeding Allergy: hayfever  GEN: nad, alert and oriented HEENT: mucous membranes moist NECK: supple w/o LA CV: rrr. PULM: ctab, no inc wob ABD: soft, +bs EXT: no edema SKIN: no acute rash

## 2017-08-31 NOTE — Assessment & Plan Note (Signed)
CAD s/p CABG. No CP, SOB, BLE edema.  Continue meds as is.  He agrees.

## 2017-08-31 NOTE — Assessment & Plan Note (Signed)
On allopurinol now.  Labs d/w pt.  No ADE on med.  Uric acid at goal. Continue as is.  He agrees.

## 2017-08-31 NOTE — Assessment & Plan Note (Signed)
Advance directive- wife designated if patient were incapacitated.  

## 2017-08-31 NOTE — Assessment & Plan Note (Signed)
Flu 2018 Shingles 2013 PNA 2015 Tetanus 2010 Colonoscopy 2018 Prostate cancer screening and PSA options (with potential risks and benefits of testing vs not testing) were discussed along with recent recs/guidelines.  He declined testing PSA at this point. Advance directive- wife designated if patient were incapacitated.   Cognitive function addressed- see scanned forms- and if abnormal then additional documentation follows.  HCV screening prev neg

## 2017-10-01 DIAGNOSIS — D1801 Hemangioma of skin and subcutaneous tissue: Secondary | ICD-10-CM | POA: Diagnosis not present

## 2017-10-01 DIAGNOSIS — L814 Other melanin hyperpigmentation: Secondary | ICD-10-CM | POA: Diagnosis not present

## 2017-10-01 DIAGNOSIS — L570 Actinic keratosis: Secondary | ICD-10-CM | POA: Diagnosis not present

## 2017-10-01 DIAGNOSIS — I872 Venous insufficiency (chronic) (peripheral): Secondary | ICD-10-CM | POA: Diagnosis not present

## 2017-10-01 DIAGNOSIS — L821 Other seborrheic keratosis: Secondary | ICD-10-CM | POA: Diagnosis not present

## 2017-11-28 ENCOUNTER — Encounter: Payer: Self-pay | Admitting: Cardiology

## 2017-12-01 ENCOUNTER — Telehealth: Payer: Self-pay | Admitting: Cardiology

## 2017-12-01 MED ORDER — METOPROLOL TARTRATE 100 MG PO TABS: 100.0000 mg | ORAL_TABLET | Freq: Two times a day (BID) | ORAL | 0 refills | Status: DC

## 2017-12-01 NOTE — Telephone Encounter (Signed)
NEW MESSAGE     *STAT* If patient is at the pharmacy, call can be transferred to refill team.   1. Which medications need to be refilled? (please list name of each medication and dose if known) metoprolol tartrate (LOPRESSOR) 100 MG tablet  2. Which pharmacy/location (including street and city if local pharmacy) is medication to be sent to?[id number] [place],  [address]  3. Do they need a 30 day or 90 day supply? [place]

## 2017-12-01 NOTE — Telephone Encounter (Signed)
Rx has been sent to the pharmacy electronically. ° °

## 2017-12-06 NOTE — Progress Notes (Signed)
HPI The patient presents for yearly follow up.  Since I last saw him he had a difficult year.  He had some vision problems.  He had gout and had to have surgery on his thumb.  He developed an allergy to Septra that he was treated with.  He had diffuse muscle aches thought maybe to be medication related.  Spironolactone was stopped.  Valsartan was stopped.  He is having his blood pressure medications followed by Dr. Damita Dunnings.  He finally started feeling better.  He denies any chest pressure, neck or arm discomfort.  He is been doing stuff around the yard but not exercising routinely.  He has had no palpitations, presyncope or syncope.  He said no PND or orthopnea.  Allergies  Allergen Reactions  . Septra [Sulfamethoxazole-Trimethoprim]     diarrhea    Current Outpatient Medications  Medication Sig Dispense Refill  . allopurinol (ZYLOPRIM) 100 MG tablet Take 1 tablet (100 mg total) by mouth daily. 90 tablet 3  . amLODipine (NORVASC) 10 MG tablet Take 1 tablet (10 mg total) by mouth daily. 90 tablet 3  . aspirin 81 MG tablet Take 81 mg by mouth daily.      Marland Kitchen atorvastatin (LIPITOR) 80 MG tablet Take 0.5 tablets (40 mg total) by mouth daily. 45 tablet 3  . Loratadine (CLARITIN PO) Take 1 tablet by mouth daily as needed. Take 1 tab by mouth daily as needed    . Melatonin-Pyridoxine (MELATIN PO) Take by mouth at bedtime as needed.    . metoprolol tartrate (LOPRESSOR) 100 MG tablet Take 1 tablet (100 mg total) by mouth 2 (two) times daily. 60 tablet 0  . sildenafil (REVATIO) 20 MG tablet Take 5 tablets (100 mg total) by mouth daily as needed. 50 tablet 12  . timolol (BETIMOL) 0.5 % ophthalmic solution 1 drop as directed.       No current facility-administered medications for this visit.     Past Medical History:  Diagnosis Date  . CAD (coronary artery disease) 07/17/2004  . Diverticulosis of colon 12/15/2005  . Esophageal stricture   . Gastritis   . GERD (gastroesophageal reflux disease)  09/16/1996  . Glaucoma   . Heart disease   . History of tobacco abuse    Quit 2000  . Hx of cardiovascular stress test    ETT-Myoview (01/2014):  1.5 mm ST depression in inf leads at peak exercise; no ischemia or scar, EF 57%, Low Risk  . Hyperlipidemia 03/17/1999  . Hypertension 03/17/1999  . Stress fracture of foot    right midfoot, per Dr. Canary Brim 2012    Past Surgical History:  Procedure Laterality Date  . COLONOSCOPY  12/30/2005   Divertics, mild, 10 yrs  . COLONOSCOPY  11/26/2006   Colitis biopsy neg  . CORONARY ARTERY BYPASS GRAFT  07/27/2004   By Dr. Roxy Manns with a LIMA to the LAD, RIMA to the distal right coronary artery, spahenous vein graft first diagonal, saphenous vein graft to the first circumflex marginal, sequential saphenous vein graft to the second  . ESOPHAGOGASTRODUODENOSCOPY  10/08/2000   Stricture distal esoph, repeat dilation done 2012  . ESOPHAGOGASTRODUODENOSCOPY  10/08/2000   Stricture distal esoph  . I&D EXTREMITY Left 01/24/2017   Procedure: MINOR INCISION AND DRAINAGE LEFT THUMB;  Surgeon: Leanora Cover, MD;  Location: [facility];  Service: Orthopedics;  Laterality: Left;    ROS:    As stated in the HPI and negative for all other systems.   PHYSICAL  EXAM BP (!) 152/88   Pulse 62   Ht 5\' 8"  (1.727 m)   Wt 202 lb 6.4 oz (91.8 kg)   BMI 30.77 kg/m   GENERAL:  Well appearing NECK:  No jugular venous distention, waveform within normal limits, carotid upstroke brisk and symmetric, left bruits, no thyromegaly LUNGS:  Clear to auscultation bilaterally CHEST: Well healed sternotomy scar. HEART:  PMI not displaced or sustained,S1 and S2 within normal limits, no S3, no S4, no clicks, no rubs, no murmurs ABD:  Flat, positive bowel sounds normal in frequency in pitch, no bruits, no rebound, no guarding, no midline pulsatile mass, no hepatomegaly, no splenomegaly EXT:  2 plus pulses throughout, no edema, no cyanosis no clubbing   EKG:  Sinus rhythm,  rate 62  axis within normal limits, intervals within normal limits, no acute ST-T wave changes. 12/08/2017  Lab Results  Component Value Date   CHOL 178 08/21/2016   TRIG 147.0 08/21/2016   HDL 65.50 08/21/2016   LDLCALC 83 08/21/2016   LDLDIRECT 69.6 07/28/2012    ASSESSMENT AND PLAN  CAD:   The patient has no new sypmtoms.  No further cardiovascular testing is indicated.  We will continue with aggressive risk reduction and meds as listed.  He has had no symptoms since his last stress test.  HTN:     The blood pressure is slightly elevated.  However, he is having this actively managed by Dr. Damita Dunnings and he knows that I would like his systolic to be in the 098 range.  CAROTID STENOSIS:    He has moderate stenosis was 0-39% right and 40-59% left. I reviewed the 2018 study. He is due for follow up.  He has this arranged.  CHOLESTEROL:    He will have a lipid profile and liver enzymes today.

## 2017-12-08 ENCOUNTER — Ambulatory Visit: Payer: Medicare HMO | Admitting: Cardiology

## 2017-12-08 ENCOUNTER — Encounter: Payer: Self-pay | Admitting: Cardiology

## 2017-12-08 VITALS — BP 152/88 | HR 62 | Ht 68.0 in | Wt 202.4 lb

## 2017-12-08 DIAGNOSIS — Z79899 Other long term (current) drug therapy: Secondary | ICD-10-CM

## 2017-12-08 DIAGNOSIS — I6523 Occlusion and stenosis of bilateral carotid arteries: Secondary | ICD-10-CM | POA: Diagnosis not present

## 2017-12-08 DIAGNOSIS — I2510 Atherosclerotic heart disease of native coronary artery without angina pectoris: Secondary | ICD-10-CM

## 2017-12-08 DIAGNOSIS — E785 Hyperlipidemia, unspecified: Secondary | ICD-10-CM | POA: Diagnosis not present

## 2017-12-08 LAB — LIPID PANEL
CHOL/HDL RATIO: 1.8 ratio (ref 0.0–5.0)
Cholesterol, Total: 147 mg/dL (ref 100–199)
HDL: 81 mg/dL (ref >39)
LDL Calculated: 45 mg/dL (ref 0–99)
Triglycerides: 106 mg/dL (ref 0–149)
VLDL CHOLESTEROL CAL: 21 mg/dL (ref 5–40)

## 2017-12-08 LAB — HEPATIC FUNCTION PANEL
ALK PHOS: 99 IU/L (ref 39–117)
ALT: 20 IU/L (ref 0–44)
AST: 14 IU/L (ref 0–40)
Albumin: 4.3 g/dL (ref 3.5–4.8)
Bilirubin Total: 0.7 mg/dL (ref 0.0–1.2)
Bilirubin, Direct: 0.21 mg/dL (ref 0.00–0.40)
TOTAL PROTEIN: 7.3 g/dL (ref 6.0–8.5)

## 2017-12-08 NOTE — Patient Instructions (Signed)
Medication Instructions:  Continue current medications  If you need a refill on your cardiac medications before your next appointment, please call your pharmacy.  Labwork: Fasting Lipids Liver HERE IN OUR OFFICE AT [facility]  Take the provided lab slips for you to take with you to the lab for you blood draw.   You will need to fast. DO NOT EAT OR DRINK PAST MIDNIGHT.   Testing/Procedures: None Ordered  Follow-Up: Your physician wants you to follow-up in: 1 Year. You should receive a reminder letter in the mail two months in advance. If you do not receive a letter, please call our office [phone].     Thank you for choosing [facility] at [facility]!!

## 2017-12-17 ENCOUNTER — Ambulatory Visit (INDEPENDENT_AMBULATORY_CARE_PROVIDER_SITE_OTHER): Payer: Medicare HMO

## 2017-12-17 DIAGNOSIS — I6523 Occlusion and stenosis of bilateral carotid arteries: Secondary | ICD-10-CM

## 2017-12-19 ENCOUNTER — Telehealth: Payer: Self-pay | Admitting: *Deleted

## 2017-12-19 DIAGNOSIS — I6523 Occlusion and stenosis of bilateral carotid arteries: Secondary | ICD-10-CM

## 2017-12-19 NOTE — Telephone Encounter (Signed)
Pt aware of his carotid doppler  Order placed for 1 year

## 2017-12-19 NOTE — Telephone Encounter (Signed)
-----   Message from Minus Breeding, MD sent at 12/19/2017  1:34 AM EDT ----- Right Carotid: Velocities in the right ICA are consistent with a 1-39% stenosis. Left Carotid: Velocities in the left ICA are consistent with a 40-59% stenosis. Follow up on 12 months.   Call Alan [name] with the results and send results to Tonia Ghent, MD

## 2017-12-23 ENCOUNTER — Encounter: Payer: Self-pay | Admitting: Family Medicine

## 2017-12-23 ENCOUNTER — Ambulatory Visit (INDEPENDENT_AMBULATORY_CARE_PROVIDER_SITE_OTHER): Payer: Medicare HMO | Admitting: Family Medicine

## 2017-12-23 DIAGNOSIS — I10 Essential (primary) hypertension: Secondary | ICD-10-CM

## 2017-12-23 NOTE — Progress Notes (Signed)
He had diffuse muscle aches thought maybe to be medication related.  Spironolactone was stopped.  Valsartan was stopped.   The aches got better thereafter.  He should avoid HCTZ given h/o gout.   D/w pt.    No more gout flares.  Uric acid 6.4 on last check.   D/w pt.    Goal SBP 130s.  BP was higher at cardiology office.  D/w pt.  He has check BP at home.  Has been in the 749 systolic at home.  Usually 44-96P diastolic.  Those are midday readings.    No CP, SOB; only mild occ BLE edema.    Meds, vitals, and allergies reviewed.   ROS: Per HPI unless specifically indicated in ROS section   GEN: nad, alert and oriented HEENT: mucous membranes moist NECK: supple w/o LA CV: rrr PULM: ctab, no inc wob ABD: soft, +bs EXT: no edema SKIN: well perfused.

## 2017-12-23 NOTE — Patient Instructions (Signed)
Check your BP at home at rest in the AM after your have urinated.  Calibrate your cuff at a visit.  Goal BP is 130s/80s or lower.   Update me as needed.  Likely not needed or useful to change meds at this point.  Take care.  Glad to see you.

## 2017-12-24 NOTE — Assessment & Plan Note (Signed)
Goal SBP 130s.  BP was higher at cardiology office.  D/w pt.  He has check BP at home.  Has been in the 416 systolic at home.  Usually 60-63K diastolic.  Those are midday readings.   He'll check BP at home at rest in the AM after having urinated.  He can calibrate your cuff at a visit.  Goal BP is 130s/80s or lower.   Update me as needed.  Likely not needed or useful to change meds at this point.  He agrees.

## 2017-12-28 ENCOUNTER — Other Ambulatory Visit: Payer: Self-pay | Admitting: Cardiology

## 2018-02-10 DIAGNOSIS — H40033 Anatomical narrow angle, bilateral: Secondary | ICD-10-CM | POA: Diagnosis not present

## 2018-02-10 DIAGNOSIS — H40053 Ocular hypertension, bilateral: Secondary | ICD-10-CM | POA: Diagnosis not present

## 2018-02-10 DIAGNOSIS — H40013 Open angle with borderline findings, low risk, bilateral: Secondary | ICD-10-CM | POA: Diagnosis not present

## 2018-03-28 ENCOUNTER — Other Ambulatory Visit: Payer: Self-pay | Admitting: Cardiology

## 2018-03-30 NOTE — Telephone Encounter (Signed)
Follow   !!! Please also send 90 day supply thru mailorder    1. Which medications need to be refilled? (please list name of each medication and dose if known) metoprolol tartrate (LOPRESSOR) 100 MG tablet  2. Which pharmacy/location (including street and city if local pharmacy) is medication to be sent to? [id number] [place], [place] - [address]  3. Do they need a 30 day or 90 day supply? [place]

## 2018-04-01 ENCOUNTER — Telehealth: Payer: Self-pay | Admitting: Cardiology

## 2018-04-01 MED ORDER — METOPROLOL TARTRATE 100 MG PO TABS: 100.0000 mg | ORAL_TABLET | Freq: Two times a day (BID) | ORAL | 3 refills | Status: DC

## 2018-04-01 NOTE — Telephone Encounter (Signed)
New message     *STAT* If patient is at the pharmacy, call can be transferred to refill team.   1. Which medications need to be refilled? (please list name of each medication and dose if known) metoprolol tartrate (LOPRESSOR) 100 MG tablet  2. Which pharmacy/location (including street and city if local pharmacy) is medication to be sent to?[id number] [place], [place] - [address]  3. Do they need a 30 day or 90 day supply? [place]

## 2018-04-01 NOTE — Telephone Encounter (Signed)
Submitted RX to pharmacy

## 2018-05-06 DIAGNOSIS — H2513 Age-related nuclear cataract, bilateral: Secondary | ICD-10-CM | POA: Diagnosis not present

## 2018-05-06 DIAGNOSIS — H43813 Vitreous degeneration, bilateral: Secondary | ICD-10-CM | POA: Diagnosis not present

## 2018-05-06 DIAGNOSIS — H35051 Retinal neovascularization, unspecified, right eye: Secondary | ICD-10-CM | POA: Diagnosis not present

## 2018-05-06 DIAGNOSIS — H353113 Nonexudative age-related macular degeneration, right eye, advanced atrophic without subfoveal involvement: Secondary | ICD-10-CM | POA: Diagnosis not present

## 2018-05-06 DIAGNOSIS — H353131 Nonexudative age-related macular degeneration, bilateral, early dry stage: Secondary | ICD-10-CM | POA: Diagnosis not present

## 2018-05-06 DIAGNOSIS — H353212 Exudative age-related macular degeneration, right eye, with inactive choroidal neovascularization: Secondary | ICD-10-CM | POA: Diagnosis not present

## 2018-06-25 ENCOUNTER — Ambulatory Visit: Payer: Medicare HMO

## 2018-06-25 ENCOUNTER — Ambulatory Visit (INDEPENDENT_AMBULATORY_CARE_PROVIDER_SITE_OTHER): Payer: Medicare HMO | Admitting: Family Medicine

## 2018-06-25 ENCOUNTER — Encounter: Payer: Self-pay | Admitting: Family Medicine

## 2018-06-25 VITALS — BP 142/74 | HR 72 | Temp 98.5°F | Ht 68.0 in | Wt 198.8 lb

## 2018-06-25 DIAGNOSIS — Z23 Encounter for immunization: Secondary | ICD-10-CM

## 2018-06-25 DIAGNOSIS — S91339A Puncture wound without foreign body, unspecified foot, initial encounter: Secondary | ICD-10-CM | POA: Diagnosis not present

## 2018-06-25 NOTE — Progress Notes (Signed)
Stepped on a nail.  Went through his shoe and it pricked the bottom of his L foot.  This was yesterday.  No sig bleeding, only dot of blood.  He washed it at the time. No pain walking.    He has felt well in general, without aches.   No recent gout symptoms.  Meds, vitals, and allergies reviewed.   ROS: Per HPI unless specifically indicated in ROS section   nad L foot with normal inspection except for ,inimal irritation at prick site near the L 5th MT.  Nails thickened.  Normal DP pulse Normal inspection o/w.  Not bruised or puffy.   No sign of infection.  No spreading erythema.  No pus.  No drainage.

## 2018-06-25 NOTE — Patient Instructions (Signed)
Keep the area clean and covered. Update me as needed.  Take care.  Glad to see you.

## 2018-06-28 DIAGNOSIS — S91339A Puncture wound without foreign body, unspecified foot, initial encounter: Secondary | ICD-10-CM | POA: Insufficient documentation

## 2018-06-28 NOTE — Assessment & Plan Note (Signed)
Fortunately, this looks like it only minimally penetrated the skin.  He has a very superficial prick site that does not appear infected or likely to be a problem in the long run.  He had previously cleaned the area.  It is clean and appears to be healing normally now.  I covered with a Band-Aid.  Reasonable to get tetanus and flu shot done today.  Update me as needed.  Routine cautions given.  Given the very superficial nature, no imaging needed at this point.  He agrees with plan.

## 2018-08-19 ENCOUNTER — Ambulatory Visit: Payer: Medicare HMO | Admitting: Family Medicine

## 2018-08-24 ENCOUNTER — Other Ambulatory Visit: Payer: Self-pay | Admitting: Family Medicine

## 2018-08-24 DIAGNOSIS — I10 Essential (primary) hypertension: Secondary | ICD-10-CM

## 2018-08-24 DIAGNOSIS — M109 Gout, unspecified: Secondary | ICD-10-CM

## 2018-08-25 ENCOUNTER — Ambulatory Visit (INDEPENDENT_AMBULATORY_CARE_PROVIDER_SITE_OTHER): Payer: Medicare HMO | Admitting: Family Medicine

## 2018-08-25 ENCOUNTER — Encounter: Payer: Self-pay | Admitting: Family Medicine

## 2018-08-25 ENCOUNTER — Other Ambulatory Visit: Payer: Medicare HMO

## 2018-08-25 VITALS — BP 130/70 | HR 60 | Temp 98.1°F | Ht 68.0 in | Wt 199.6 lb

## 2018-08-25 DIAGNOSIS — I10 Essential (primary) hypertension: Secondary | ICD-10-CM | POA: Diagnosis not present

## 2018-08-25 DIAGNOSIS — B9789 Other viral agents as the cause of diseases classified elsewhere: Secondary | ICD-10-CM

## 2018-08-25 DIAGNOSIS — M109 Gout, unspecified: Secondary | ICD-10-CM

## 2018-08-25 DIAGNOSIS — H6121 Impacted cerumen, right ear: Secondary | ICD-10-CM | POA: Diagnosis not present

## 2018-08-25 DIAGNOSIS — J069 Acute upper respiratory infection, unspecified: Secondary | ICD-10-CM

## 2018-08-25 LAB — URIC ACID: Uric Acid, Serum: 6.4 mg/dL (ref 4.0–7.8)

## 2018-08-25 LAB — COMPREHENSIVE METABOLIC PANEL
ALT: 17 U/L (ref 0–53)
AST: 14 U/L (ref 0–37)
Albumin: 4.4 g/dL (ref 3.5–5.2)
Alkaline Phosphatase: 102 U/L (ref 39–117)
BILIRUBIN TOTAL: 0.7 mg/dL (ref 0.2–1.2)
BUN: 11 mg/dL (ref 6–23)
CO2: 33 mEq/L — ABNORMAL HIGH (ref 19–32)
Calcium: 10.1 mg/dL (ref 8.4–10.5)
Chloride: 101 mEq/L (ref 96–112)
Creatinine, Ser: 0.92 mg/dL (ref 0.40–1.50)
GFR: 85.91 mL/min (ref >60.00)
Glucose, Bld: 115 mg/dL — ABNORMAL HIGH (ref 70–99)
Potassium: 4.1 mEq/L (ref 3.5–5.1)
Sodium: 139 mEq/L (ref 135–145)
Total Protein: 7.8 g/dL (ref 6.0–8.3)

## 2018-08-25 LAB — LIPID PANEL
CHOLESTEROL: 140 mg/dL (ref 0–200)
HDL: 58.3 mg/dL (ref >39.00)
LDL Cholesterol: 46 mg/dL (ref 0–99)
NonHDL: 81.9
Total CHOL/HDL Ratio: 2
Triglycerides: 180 mg/dL — ABNORMAL HIGH (ref 0.0–149.0)
VLDL: 36 mg/dL (ref 0.0–40.0)

## 2018-08-25 NOTE — Assessment & Plan Note (Signed)
Labs today per PCP orders

## 2018-08-25 NOTE — Progress Notes (Signed)
Subjective:     Alan [name] is a 72 y.o. male presenting for Cough (productive at times, green with a little blood, started about 3 weeks ago)     Cough  This is a new problem. The current episode started 1 to 4 weeks ago. The problem has been unchanged. The problem occurs every few hours. The cough is productive of blood-tinged sputum. Associated symptoms include nasal congestion, postnasal drip and rhinorrhea. Pertinent negatives include no chills, ear pain, fever, headaches, myalgias, shortness of breath or wheezing. The symptoms are aggravated by lying down (sitting in recliner). Treatments tried: mucinex, loratidine  The treatment provided moderate relief.   Started around thanksgiving with cold symptoms and initially called but canceled appointment    Review of Systems  Constitutional: Negative for chills and fever.  HENT: Positive for postnasal drip and rhinorrhea. Negative for ear pain and sinus pressure.   Respiratory: Positive for cough. Negative for shortness of breath and wheezing.   Gastrointestinal: Negative for nausea and vomiting.  Musculoskeletal: Negative for arthralgias and myalgias.  Neurological: Negative for headaches.     Social History   Tobacco Use  Smoking Status Former Smoker  . Types: Cigarettes  . Last attempt to quit: 09/16/1998  . Years since quitting: 19.9  Smokeless Tobacco Never Used        Objective:    BP Readings from Last 3 Encounters:  08/25/18 130/70  06/25/18 (!) 142/74  12/23/17 138/78   Wt Readings from Last 3 Encounters:  08/25/18 199 lb 9 oz (90.5 kg)  06/25/18 198 lb 12 oz (90.2 kg)  12/23/17 196 lb 12 oz (89.2 kg)    BP 130/70   Pulse 60   Temp 98.1 F (36.7 C) (Oral)   Ht 5\' 8"  (1.727 m)   Wt 199 lb 9 oz (90.5 kg)   SpO2 97%   BMI 30.34 kg/m    Physical Exam  Constitutional: He appears well-developed and well-nourished. He does not appear ill. No distress.  HENT:  Head: Normocephalic and atraumatic.    Right Ear: Ear canal normal.  Left Ear: Tympanic membrane and ear canal normal.  Nose: Mucosal edema and rhinorrhea present. Right sinus exhibits no maxillary sinus tenderness and no frontal sinus tenderness. Left sinus exhibits no maxillary sinus tenderness and no frontal sinus tenderness.  Mouth/Throat: Uvula is midline and mucous membranes are normal. Posterior oropharyngeal erythema present. No oropharyngeal exudate or posterior oropharyngeal edema. Tonsils are 0 on the right. Tonsils are 0 on the left.  Right ear with impacted cerumen  Eyes: EOM are normal.  Neck: Neck supple.  Cardiovascular: Normal rate and regular rhythm.  No murmur heard. Pulmonary/Chest: Effort normal and breath sounds normal. No respiratory distress.  Faint rhonchi  Lymphadenopathy:    He has no cervical adenopathy.  Neurological: He is alert.  Skin: Skin is warm and dry. Capillary refill takes less than 2 seconds.  Psychiatric: He has a normal mood and affect.          Assessment & Plan:   Problem List Items Addressed This Visit      Cardiovascular and Mediastinum   Essential hypertension    Labs today per PCP orders        Other   Gout    Labs today per PCP orders       Other Visit Diagnoses    Viral URI with cough    -  Primary     Discussed that this is a post-viral  cough. No severe congestion or fevers to suggest sinus infection at this time.   Expectation of 6 weeks discussed and return if persisting  Saline rinse, Flonase, honey and symptomatic care  Able to sleep, do not feel cough suppressant is necessary at this time  Return if symptoms worsen or fail to improve.  Lesleigh Noe, MD

## 2018-08-25 NOTE — Patient Instructions (Addendum)
Based on your symptoms, it looks like you have a virus.   Your cough may last for 6 weeks   1. Drink plenty of fluids 2. Get lots of rest  Sinus Congestion 1) Neti Pot (Saline rinse) -- 2 times day -- if tolerated 2) Flonase (Store Brand ok) - start with once daily 3) Over the counter congestion medications  Cough 1) Cough drops can be helpful 2) Honey is proven to be one of the best cough medications   If you develop fevers (Temperature >100.4), chills, worsening symptoms or symptoms lasting longer than 10 days return to clinic.

## 2018-09-01 ENCOUNTER — Ambulatory Visit (INDEPENDENT_AMBULATORY_CARE_PROVIDER_SITE_OTHER): Payer: Medicare HMO | Admitting: Family Medicine

## 2018-09-01 ENCOUNTER — Encounter: Payer: Self-pay | Admitting: Family Medicine

## 2018-09-01 VITALS — BP 140/72 | HR 60 | Temp 97.9°F | Ht 68.0 in | Wt 200.5 lb

## 2018-09-01 DIAGNOSIS — I10 Essential (primary) hypertension: Secondary | ICD-10-CM

## 2018-09-01 DIAGNOSIS — M109 Gout, unspecified: Secondary | ICD-10-CM | POA: Diagnosis not present

## 2018-09-01 DIAGNOSIS — R7309 Other abnormal glucose: Secondary | ICD-10-CM | POA: Diagnosis not present

## 2018-09-01 DIAGNOSIS — E785 Hyperlipidemia, unspecified: Secondary | ICD-10-CM | POA: Diagnosis not present

## 2018-09-01 DIAGNOSIS — Z0000 Encounter for general adult medical examination without abnormal findings: Secondary | ICD-10-CM | POA: Diagnosis not present

## 2018-09-01 DIAGNOSIS — Z7189 Other specified counseling: Secondary | ICD-10-CM

## 2018-09-01 MED ORDER — AMLODIPINE BESYLATE 10 MG PO TABS: 10.0000 mg | ORAL_TABLET | Freq: Every day | ORAL | 3 refills | Status: DC

## 2018-09-01 MED ORDER — ALLOPURINOL 100 MG PO TABS: 100.0000 mg | ORAL_TABLET | Freq: Every day | ORAL | 3 refills | Status: DC

## 2018-09-01 MED ORDER — ATORVASTATIN CALCIUM 40 MG PO TABS: 40.0000 mg | ORAL_TABLET | Freq: Every day | ORAL | 3 refills | Status: DC

## 2018-09-01 MED ORDER — SILDENAFIL CITRATE 20 MG PO TABS: 100.0000 mg | ORAL_TABLET | Freq: Every day | ORAL | 12 refills | Status: DC | PRN

## 2018-09-01 NOTE — Patient Instructions (Signed)
Dose change on the atorvastatin- 40mg  tabs now- 1 pill a day.   Don't change your meds otherwise.  Take care.  Glad to see you.

## 2018-09-01 NOTE — Progress Notes (Signed)
I have personally reviewed the Medicare Annual Wellness questionnaire and have noted 1. The patient's medical and social history 2. Their use of alcohol, tobacco or illicit drugs 3. Their current medications and supplements 4. The patient's functional ability including ADL's, fall risks, home safety risks and hearing or visual             impairment. 5. Diet and physical activities 6. Evidence for depression or mood disorders  The patients weight, height, BMI have been recorded in the chart and visual acuity is per eye clinic.  I have made referrals, counseling and provided education to the patient based review of the above and I have provided the pt with a written personalized care plan for preventive services.  Provider list updated- see scanned forms.  Routine anticipatory guidance given to patient.  See health maintenance. The possibility exists that previously documented standard health maintenance information may have been brought forward from a previous encounter into this note.  If needed, that same information has been updated to reflect the current situation based on today's encounter.    Flu 2019 Shingles 2013 PNA 2015 Tetanus 2019 Colonoscopy 2018 Prostate cancer screening and PSA options(with potential risks and benefits of testing vs not testing) were discussed along with recent recs/guidelines. He declined testing PSAat this point. Advance directive- wife designated if patient were incapacitated.  Cognitive function addressed- see scanned forms- and if abnormal then additional documentation follows.  HCV screening prev neg  Gout. No gout flares now.  Taking allopurinol daily.  Labs d/w pt.   Hypertension:    Using medication without problems or lightheadedness: yes Chest pain with exertion:no Edema:no Short of breath:no  Elevated Cholesterol: Using medications without problems: yes Muscle aches: no Diet compliance: yes Exercise: yes Labs d/w pt.     Hyperglycemia d/w pt.  Diet and exercise d/w pt.  He has been doing a lot of chores and will resume his exercise routine o/w in the near future.  Discussed tapering alcohol slightly.  Recent URI is better.    PMH and SH reviewed  Meds, vitals, and allergies reviewed.   ROS: Per HPI.  Unless specifically indicated otherwise in HPI, the patient denies:  General: fever. Eyes: acute vision changes ENT: sore throat Cardiovascular: chest pain Respiratory: SOB GI: vomiting GU: dysuria Musculoskeletal: acute back pain Derm: acute rash Neuro: acute motor dysfunction Psych: worsening mood Endocrine: polydipsia Heme: bleeding Allergy: hayfever  GEN: nad, alert and oriented HEENT: mucous membranes moist NECK: supple w/o LA CV: rrr. PULM: ctab, no inc wob ABD: soft, +bs EXT: trace- almost no- BLE edema SKIN: well perfused.

## 2018-09-03 NOTE — Assessment & Plan Note (Addendum)
Reasonable control.  Continue work on diet and exercise.  He agrees.  No change in meds.

## 2018-09-03 NOTE — Assessment & Plan Note (Signed)
No gout flares now.  Taking allopurinol daily.  Labs d/w pt.

## 2018-09-03 NOTE — Assessment & Plan Note (Signed)
Discussed labs, diet, exercise.  We can recheck periodically.  He agrees.

## 2018-09-03 NOTE — Assessment & Plan Note (Signed)
No change in meds at this point.  He agrees. Labs discussed with patient.  Continue work on diet and exercise to help with triglycerides.

## 2018-09-03 NOTE — Assessment & Plan Note (Signed)
Advance directive- wife designated if patient were incapacitated.  

## 2018-09-03 NOTE — Assessment & Plan Note (Signed)
Flu 2019 Shingles 2013 PNA 2015 Tetanus 2019 Colonoscopy 2018 Prostate cancer screening and PSA options(with potential risks and benefits of testing vs not testing) were discussed along with recent recs/guidelines. He declined testing PSAat this point. Advance directive- wife designated if patient were incapacitated.  Cognitive function addressed- see scanned forms- and if abnormal then additional documentation follows.  HCV screening prev neg

## 2018-10-05 DIAGNOSIS — H25013 Cortical age-related cataract, bilateral: Secondary | ICD-10-CM | POA: Diagnosis not present

## 2018-10-05 DIAGNOSIS — H40053 Ocular hypertension, bilateral: Secondary | ICD-10-CM | POA: Diagnosis not present

## 2018-10-05 DIAGNOSIS — H40033 Anatomical narrow angle, bilateral: Secondary | ICD-10-CM | POA: Diagnosis not present

## 2018-10-05 DIAGNOSIS — H40013 Open angle with borderline findings, low risk, bilateral: Secondary | ICD-10-CM | POA: Diagnosis not present

## 2018-11-24 DIAGNOSIS — H40033 Anatomical narrow angle, bilateral: Secondary | ICD-10-CM | POA: Diagnosis not present

## 2018-11-24 DIAGNOSIS — H40013 Open angle with borderline findings, low risk, bilateral: Secondary | ICD-10-CM | POA: Diagnosis not present

## 2018-11-24 DIAGNOSIS — H40053 Ocular hypertension, bilateral: Secondary | ICD-10-CM | POA: Diagnosis not present

## 2018-11-24 DIAGNOSIS — H25013 Cortical age-related cataract, bilateral: Secondary | ICD-10-CM | POA: Diagnosis not present

## 2018-12-04 ENCOUNTER — Telehealth: Payer: Self-pay | Admitting: Cardiology

## 2018-12-04 NOTE — Telephone Encounter (Signed)
   Primary Cardiologist:    Patient contacted.  History reviewed.  No symptoms to suggest any unstable cardiac conditions.  Based on discussion, with current pandemic situation, we will be postponing this appointment for Alan [name].  If symptoms change, he has been instructed to contact our office.   Routing to C19 CANCEL pool for tracking (P CV DIV CV19 CANCEL) and assigning priority (1 = 4-6 wks, 2 = 6-12 wks, 3 = >12 wks).  Kerin Ransom, [place]  12/04/2018 1:35 PM         .

## 2018-12-09 ENCOUNTER — Ambulatory Visit: Payer: Medicare HMO | Admitting: Cardiology

## 2018-12-24 ENCOUNTER — Telehealth: Payer: Self-pay | Admitting: Cardiology

## 2018-12-24 NOTE — Telephone Encounter (Signed)
Call and spoke with pt, advised pt he already have an appt with Dr Percival Spanish on 04/20 @9 :55, pt stated she will keep that appt for video visit.

## 2018-12-24 NOTE — Telephone Encounter (Signed)
New message:   Patient cancel his 12/29/18 appt and would like for some one to call him about doing either video or telephone visit. Please call patient back.

## 2018-12-29 ENCOUNTER — Telehealth: Payer: Self-pay

## 2018-12-29 NOTE — Telephone Encounter (Signed)
LEFT VM FOR PT TO RETURN CALL TO OFFICE FOR APPT CHANGE TO VIRTUAL

## 2019-01-01 ENCOUNTER — Telehealth: Payer: Self-pay | Admitting: Cardiology

## 2019-01-01 NOTE — Telephone Encounter (Signed)
LVM for pre reg °

## 2019-01-01 NOTE — Telephone Encounter (Signed)
Home phone/ my chart via email/ virtual consent/ pre reg completed °

## 2019-01-03 NOTE — Progress Notes (Signed)
Virtual Visit via Video Note   This visit type was conducted due to national recommendations for restrictions regarding the COVID-19 Pandemic (e.g. social distancing) in an effort to limit this patient's exposure and mitigate transmission in our community.  Due to his co-morbid illnesses, this patient is at least at moderate risk for complications without adequate follow up.  This format is felt to be most appropriate for this patient at this time.  All issues noted in this document were discussed and addressed.  A limited physical exam was performed with this format.  Please refer to the patient's chart for his consent to telehealth for [facility].   Evaluation Performed:  Follow-up visit  Date:  01/04/2019   ID:  Alan [name], Alan [name] 11-04-1945, MRN [id number]  Patient Location: Home Provider Location: Home  PCP:  Tonia Ghent, MD  Cardiologist:  Minus Breeding, MD  Electrophysiologist:  None   Chief Complaint:  cad  History of Present Illness:    Alan [name] is a 73 y.o. male with CAD status post CABG.  Since I last saw him he has done well.  He denies any cardiovascular symptoms.  He is doing some yard work.  He was doing a little biking.  He denies any cardiovascular symptoms such as chest pressure, neck or arm discomfort.  He said no palpitations, presyncope or syncope.  He has no PND or orthopnea.  He said no weight gain or edema.  His weight still maintains around 200 pounds.  The patient does not have symptoms concerning for COVID-19 infection (fever, chills, cough, or new shortness of breath).    Past Medical History:  Diagnosis Date   CAD (coronary artery disease) 07/17/2004   Diverticulosis of colon 12/15/2005   Esophageal stricture    Gastritis    GERD (gastroesophageal reflux disease) 09/16/1996   Glaucoma    Heart disease    History of tobacco abuse    Quit 2000   Hx of cardiovascular stress test    ETT-Myoview (01/2014):  1.5 mm ST depression in  inf leads at peak exercise; no ischemia or scar, EF 57%, Low Risk   Hyperlipidemia 03/17/1999   Hypertension 03/17/1999   Stress fracture of foot    right midfoot, per Dr. Canary Brim 2012   Past Surgical History:  Procedure Laterality Date   COLONOSCOPY  12/30/2005   Divertics, mild, 10 yrs   COLONOSCOPY  11/26/2006   Colitis biopsy neg   CORONARY ARTERY BYPASS GRAFT  07/27/2004   By Dr. Roxy Manns with a LIMA to the LAD, RIMA to the distal right coronary artery, spahenous vein graft first diagonal, saphenous vein graft to the first circumflex marginal, sequential saphenous vein graft to the second   ESOPHAGOGASTRODUODENOSCOPY  10/08/2000   Stricture distal esoph, repeat dilation done 2012   ESOPHAGOGASTRODUODENOSCOPY  10/08/2000   Stricture distal esoph   I&D EXTREMITY Left 01/24/2017   Procedure: MINOR INCISION AND DRAINAGE LEFT THUMB;  Surgeon: Leanora Cover, MD;  Location: [facility];  Service: Orthopedics;  Laterality: Left;     Prior to Admission medications   Medication Sig Start Date End Date Taking? Authorizing Provider  Loratadine 10 MG CAPS Take 1 tablet by mouth daily as needed. 11/07/15  Yes [provider]  allopurinol (ZYLOPRIM) 100 MG tablet Take 1 tablet (100 mg total) by mouth daily. 09/01/18   Tonia Ghent, MD  amLODipine (NORVASC) 10 MG tablet Take 1 tablet (10 mg total) by mouth daily. 09/01/18  Tonia Ghent, MD  aspirin 81 MG tablet Take 81 mg by mouth daily.      [provider]  atorvastatin (LIPITOR) 40 MG tablet Take 1 tablet (40 mg total) by mouth daily. 09/01/18   Tonia Ghent, MD  guaiFENesin (MUCINEX PO) Take 1 tablet by mouth as needed.     [provider]  latanoprost (XALATAN) 0.005 % ophthalmic solution Place 1 drop into both eyes daily. 12/21/18   [provider]  Melatonin-Pyridoxine (MELATIN PO) Take 1 tablet by mouth at bedtime as needed.     [provider]  metoprolol tartrate  (LOPRESSOR) 100 MG tablet Take 1 tablet (100 mg total) by mouth 2 (two) times daily. 04/01/18   Minus Breeding, MD  sildenafil (REVATIO) 20 MG tablet Take 5 tablets (100 mg total) by mouth daily as needed. 09/01/18   Tonia Ghent, MD  timolol (TIMOPTIC) 0.5 % ophthalmic solution Place 1 drop into both eyes daily. 10/12/18   [provider]     Allergies:   Hctz [hydrochlorothiazide]; Septra [sulfamethoxazole-trimethoprim]; Spironolactone; and Valsartan   Social History   Tobacco Use   Smoking status: Former Smoker    Types: Cigarettes    Last attempt to quit: 09/16/1998    Years since quitting: 20.3   Smokeless tobacco: Never Used  Substance Use Topics   Alcohol use: Yes    Comment: occasional beer   Drug use: No     Family Hx: The patient's family history includes Breast cancer in his sister; Colon polyps in his brother and another family member; Heart attack in his brother; Heart disease in his brother; Hypertension in his brother, sister, sister, and sister; Peripheral vascular disease in his mother; Stroke in his father. There is no history of Diabetes, Depression, Alcohol abuse, Drug abuse, Prostate cancer, or Colon cancer.  ROS:   Please see the history of present illness.    As stated in the HPI and negative for all other systems.   Prior CV studies:   The following studies were reviewed today:    Labs/Other Tests and Data Reviewed:    EKG:  No ECG reviewed.  Recent Labs: 08/25/2018: ALT 17; BUN 11; Creatinine, Ser 0.92; Potassium 4.1; Sodium 139   Recent Lipid Panel Lab Results  Component Value Date/Time   CHOL 140 08/25/2018 10:24 AM   CHOL 147 12/08/2017 10:48 AM   TRIG 180.0 (H) 08/25/2018 10:24 AM   HDL 58.30 08/25/2018 10:24 AM   HDL 81 12/08/2017 10:48 AM   CHOLHDL 2 08/25/2018 10:24 AM   LDLCALC 46 08/25/2018 10:24 AM   LDLCALC 45 12/08/2017 10:48 AM   LDLDIRECT 69.6 07/28/2012 08:42 AM    Wt Readings from Last 3 Encounters:    01/04/19 203 lb (92.1 kg)  09/01/18 200 lb 8 oz (90.9 kg)  08/25/18 199 lb 9 oz (90.5 kg)     Objective:    Vital Signs:  BP (!) 145/80    Pulse 83    Ht 5\' 8"  (1.727 m)    Wt 203 lb (92.1 kg)    BMI 30.87 kg/m    VITAL SIGNS:  reviewed GEN:  no acute distress EYES:  sclerae anicteric, EOMI - Extraocular Movements Intact NEURO:  alert and oriented x 3, no obvious focal deficit PSYCH:  normal affect  ASSESSMENT & PLAN:    CAD:   The patient has no new sypmtoms.  No further cardiovascular testing is indicated.  We will continue with aggressive risk  reduction and meds as listed.  HTN:     The blood pressure is slightly elevated.  He is getting keep 1 week blood pressure diary and I might need to add another agent.   CAROTID STENOSIS:    He has moderate stenosis was 0-39% right and 40-59% left. I reviewed the 2019 study.   We will repeat in six months.  We will schedule this  CHOLESTEROL:   LDL was excellent as above in Dec.  Continue current therapy.     COVID-19 Education: The signs and symptoms of COVID-19 were discussed with the patient and how to seek care for testing (follow up with PCP or arrange E-visit).  The importance of social distancing was discussed today.  Time:   Today, I have spent 20 minutes with the patient with telehealth technology discussing the above problems.     Medication Adjustments/Labs and Tests Ordered: Current medicines are reviewed at length with the patient today.  Concerns regarding medicines are outlined above.   Tests Ordered: No orders of the defined types were placed in this encounter.   Medication Changes: No orders of the defined types were placed in this encounter.   Disposition:  Follow up 12 months  Signed, Minus Breeding, MD  01/04/2019 9:27 AM    [place]

## 2019-01-04 ENCOUNTER — Encounter: Payer: Self-pay | Admitting: Cardiology

## 2019-01-04 ENCOUNTER — Telehealth (INDEPENDENT_AMBULATORY_CARE_PROVIDER_SITE_OTHER): Payer: Medicare HMO | Admitting: Cardiology

## 2019-01-04 VITALS — BP 145/80 | HR 83 | Ht 68.0 in | Wt 203.0 lb

## 2019-01-04 DIAGNOSIS — I6529 Occlusion and stenosis of unspecified carotid artery: Secondary | ICD-10-CM

## 2019-01-04 DIAGNOSIS — I2510 Atherosclerotic heart disease of native coronary artery without angina pectoris: Secondary | ICD-10-CM | POA: Diagnosis not present

## 2019-01-04 DIAGNOSIS — Z7189 Other specified counseling: Secondary | ICD-10-CM

## 2019-01-04 DIAGNOSIS — Z951 Presence of aortocoronary bypass graft: Secondary | ICD-10-CM | POA: Diagnosis not present

## 2019-01-04 NOTE — Patient Instructions (Addendum)
Medication Instructions:  Continue current medications  If you need a refill on your cardiac medications before your next appointment, please call your pharmacy.  Labwork: None Ordered   Testing/Procedures: Your physician has requested that you have a carotid duplex in 6 Months. This test is an ultrasound of the carotid arteries in your neck. It looks at blood flow through these arteries that supply the brain with blood. Allow one hour for this exam. There are no restrictions or special instructions.  Follow-Up: . Your physician recommends that you schedule a follow-up appointment in: [place], you and your health needs are our priority.  As part of our continuing mission to provide you with exceptional heart care, we have created designated Provider Care Teams.  These Care Teams include your primary Cardiologist (physician) and Advanced Practice Providers (APPs -  Physician Assistants and Nurse Practitioners) who all work together to provide you with the care you need, when you need it.  Thank you for choosing [facility] at [facility]!!

## 2019-02-02 ENCOUNTER — Encounter: Payer: Self-pay | Admitting: Family Medicine

## 2019-02-02 ENCOUNTER — Ambulatory Visit (INDEPENDENT_AMBULATORY_CARE_PROVIDER_SITE_OTHER): Payer: Medicare HMO | Admitting: Family Medicine

## 2019-02-02 DIAGNOSIS — I10 Essential (primary) hypertension: Secondary | ICD-10-CM | POA: Diagnosis not present

## 2019-02-02 MED ORDER — METOPROLOL TARTRATE 100 MG PO TABS: 150.0000 mg | ORAL_TABLET | Freq: Two times a day (BID) | ORAL | Status: DC

## 2019-02-02 NOTE — Progress Notes (Addendum)
Interactive audio and video telecommunications were attempted between this provider and patient, however failed, due to patient having technical difficulties OR patient did not have access to video capability.  We continued and completed visit with audio only.   Virtual Visit via Telephone Note  I connected with patient on 02/02/19 at 11:06 AM by telephone and verified that I am speaking with the correct person using two identifiers.  Location of patient: home.   Location of MD: [facility] Name of referring provider (if blank then none associated): Names per persons and role in encounter:  MD: Earlyne Iba, Patient: name listed above.    I discussed the limitations, risks, security and privacy concerns of performing an evaluation and management service by telephone and the availability of in person appointments. I also discussed with the patient that there may be a patient responsible charge related to this service. The patient expressed understanding and agreed to proceed.  CC HTN  History of Present Illness:  Hypertension:    Using medication without problems or lightheadedness: yes Chest pain with exertion:no Edema: mild BLE likely related to amlodipine, not much worse recently.  Resolves by the next AM.   Short of breath:no BP had been elevated on home checks.  Usually 160-170/70s.  Pulse is usually ~70  Weight today is 200, stable per patient report.  He has been exercising at baseline, doing well, cycling also.  He feels good.     Observations/Objective: nad Speech wnl  Assessment and Plan: HTN.  Inc metoprolol to 1.5 tabs BID and have patient report back about BP and pulse.  I will update cardiology and ask about other options.  He didn't tolerate ARB and spironolactone and HCTZ.    Follow Up Instructions: see above   I discussed the assessment and treatment plan with the patient. The patient was provided an opportunity to ask questions and all were answered. The  patient agreed with the plan and demonstrated an understanding of the instructions.   The patient was advised to call back or seek an in-person evaluation if the symptoms worsen or if the condition fails to improve as anticipated.  I provided 20 minutes of non-face-to-face time during this encounter.  Elsie Stain, MD

## 2019-02-03 NOTE — Assessment & Plan Note (Addendum)
Inc metoprolol to 1.5 tabs BID and have patient report back about BP and pulse.  I will update cardiology and ask about other options.  He didn't tolerate ARB and spironolactone and HCTZ.

## 2019-02-11 ENCOUNTER — Encounter: Payer: Self-pay | Admitting: Family Medicine

## 2019-02-11 DIAGNOSIS — H25013 Cortical age-related cataract, bilateral: Secondary | ICD-10-CM | POA: Diagnosis not present

## 2019-02-11 DIAGNOSIS — H2513 Age-related nuclear cataract, bilateral: Secondary | ICD-10-CM | POA: Diagnosis not present

## 2019-02-11 DIAGNOSIS — H2511 Age-related nuclear cataract, right eye: Secondary | ICD-10-CM | POA: Diagnosis not present

## 2019-02-11 DIAGNOSIS — H40013 Open angle with borderline findings, low risk, bilateral: Secondary | ICD-10-CM | POA: Diagnosis not present

## 2019-02-11 DIAGNOSIS — H40033 Anatomical narrow angle, bilateral: Secondary | ICD-10-CM | POA: Diagnosis not present

## 2019-02-24 DIAGNOSIS — H2511 Age-related nuclear cataract, right eye: Secondary | ICD-10-CM | POA: Diagnosis not present

## 2019-02-24 DIAGNOSIS — H25811 Combined forms of age-related cataract, right eye: Secondary | ICD-10-CM | POA: Diagnosis not present

## 2019-02-24 DIAGNOSIS — H40021 Open angle with borderline findings, high risk, right eye: Secondary | ICD-10-CM | POA: Diagnosis not present

## 2019-02-24 DIAGNOSIS — H40011 Open angle with borderline findings, low risk, right eye: Secondary | ICD-10-CM | POA: Diagnosis not present

## 2019-02-24 DIAGNOSIS — H40051 Ocular hypertension, right eye: Secondary | ICD-10-CM | POA: Diagnosis not present

## 2019-03-02 DIAGNOSIS — H25012 Cortical age-related cataract, left eye: Secondary | ICD-10-CM | POA: Diagnosis not present

## 2019-03-02 DIAGNOSIS — H2512 Age-related nuclear cataract, left eye: Secondary | ICD-10-CM | POA: Diagnosis not present

## 2019-03-03 ENCOUNTER — Other Ambulatory Visit: Payer: Self-pay | Admitting: Cardiology

## 2019-03-10 DIAGNOSIS — H25812 Combined forms of age-related cataract, left eye: Secondary | ICD-10-CM | POA: Diagnosis not present

## 2019-03-10 DIAGNOSIS — H40052 Ocular hypertension, left eye: Secondary | ICD-10-CM | POA: Diagnosis not present

## 2019-03-10 DIAGNOSIS — H25012 Cortical age-related cataract, left eye: Secondary | ICD-10-CM | POA: Diagnosis not present

## 2019-03-10 DIAGNOSIS — H40012 Open angle with borderline findings, low risk, left eye: Secondary | ICD-10-CM | POA: Diagnosis not present

## 2019-03-10 DIAGNOSIS — H40051 Ocular hypertension, right eye: Secondary | ICD-10-CM | POA: Diagnosis not present

## 2019-03-10 DIAGNOSIS — H2512 Age-related nuclear cataract, left eye: Secondary | ICD-10-CM | POA: Diagnosis not present

## 2019-03-10 DIAGNOSIS — H40011 Open angle with borderline findings, low risk, right eye: Secondary | ICD-10-CM | POA: Diagnosis not present

## 2019-03-16 NOTE — Telephone Encounter (Signed)
Open n error °

## 2019-06-08 ENCOUNTER — Encounter: Payer: Self-pay | Admitting: Family Medicine

## 2019-06-08 ENCOUNTER — Other Ambulatory Visit: Payer: Self-pay

## 2019-06-08 ENCOUNTER — Ambulatory Visit (INDEPENDENT_AMBULATORY_CARE_PROVIDER_SITE_OTHER): Payer: Medicare HMO | Admitting: Family Medicine

## 2019-06-08 VITALS — BP 152/72 | HR 67 | Temp 97.6°F | Ht 68.0 in | Wt 199.1 lb

## 2019-06-08 DIAGNOSIS — D1801 Hemangioma of skin and subcutaneous tissue: Secondary | ICD-10-CM | POA: Diagnosis not present

## 2019-06-08 DIAGNOSIS — Z23 Encounter for immunization: Secondary | ICD-10-CM

## 2019-06-08 DIAGNOSIS — D0462 Carcinoma in situ of skin of left upper limb, including shoulder: Secondary | ICD-10-CM | POA: Diagnosis not present

## 2019-06-08 DIAGNOSIS — I872 Venous insufficiency (chronic) (peripheral): Secondary | ICD-10-CM | POA: Diagnosis not present

## 2019-06-08 DIAGNOSIS — C44712 Basal cell carcinoma of skin of right lower limb, including hip: Secondary | ICD-10-CM | POA: Diagnosis not present

## 2019-06-08 DIAGNOSIS — H6121 Impacted cerumen, right ear: Secondary | ICD-10-CM

## 2019-06-08 DIAGNOSIS — H6120 Impacted cerumen, unspecified ear: Secondary | ICD-10-CM | POA: Diagnosis not present

## 2019-06-08 DIAGNOSIS — L821 Other seborrheic keratosis: Secondary | ICD-10-CM | POA: Diagnosis not present

## 2019-06-08 DIAGNOSIS — L570 Actinic keratosis: Secondary | ICD-10-CM | POA: Diagnosis not present

## 2019-06-08 DIAGNOSIS — L814 Other melanin hyperpigmentation: Secondary | ICD-10-CM | POA: Diagnosis not present

## 2019-06-08 DIAGNOSIS — B079 Viral wart, unspecified: Secondary | ICD-10-CM | POA: Diagnosis not present

## 2019-06-08 DIAGNOSIS — D225 Melanocytic nevi of trunk: Secondary | ICD-10-CM | POA: Diagnosis not present

## 2019-06-08 DIAGNOSIS — I10 Essential (primary) hypertension: Secondary | ICD-10-CM

## 2019-06-08 DIAGNOSIS — C44629 Squamous cell carcinoma of skin of left upper limb, including shoulder: Secondary | ICD-10-CM | POA: Diagnosis not present

## 2019-06-08 MED ORDER — METOPROLOL TARTRATE 100 MG PO TABS: 150.0000 mg | ORAL_TABLET | Freq: Two times a day (BID) | ORAL | Status: DC

## 2019-06-08 NOTE — Patient Instructions (Addendum)
If you have trouble you can try debrox.  Use that for about 15 minutes then try to irrigate in the shower.   Thanks for getting a flu shot.  Update me as needed.  Take care.  Glad to see you.   I'll check with cardiology about your carotid.   Yearly visit here when possible.

## 2019-06-08 NOTE — Progress Notes (Signed)
He went to derm clinic this AM.  He has skin biopsies pending.   He has a little swelling with amlodipine, tolerable.  He has checked his BP episodically and usually is minimally elevated with systolic and controlled diastolic.  He feels well as is.  He may not be able to tolerate BP that is [id number] systolic.  He is still active.    Recently with ear wax/sx, bilateral, R>L.  No FCNAVD.    Meds, vitals, and allergies reviewed.   ROS: Per HPI unless specifically indicated in ROS section   nad ncat Impacted wax in R ear.   Minimal wax in L canal.   R ear wax removed with irrigation w/o complication.  No need for intervention on the left.  He felt better after removal.  No complication.  Both tympanic membranes within normal limits.

## 2019-06-09 NOTE — Assessment & Plan Note (Signed)
No change in meds at this point.  See above.  Update me as needed.  Continue work on diet and exercise.

## 2019-06-09 NOTE — Assessment & Plan Note (Signed)
See above.  Removed.  No complication.  Done with irrigation.  He can use Debrox at home followed by irrigation if needed.  Update me as needed.  He agrees.

## 2019-06-10 ENCOUNTER — Telehealth: Payer: Self-pay | Admitting: *Deleted

## 2019-06-10 NOTE — Telephone Encounter (Signed)
Message sent to schedulers to arrange follow up study

## 2019-06-10 NOTE — Telephone Encounter (Signed)
-----   Message from Minus Breeding, MD sent at 06/10/2019  9:37 AM EDT ----- Sure.  We will arrange ----- Message ----- From: Tonia Ghent, MD Sent: 06/09/2019   9:41 PM EDT To: Minus Breeding, MD  Saw patient in clinic recently.  He was asking when he can go for his follow-up carotid study.  Everything got delayed with COVID.  He wanted to go to [place] if possible.  He wanted me to check with you.  Thanks.  Brigitte Pulse

## 2019-06-15 DIAGNOSIS — C44629 Squamous cell carcinoma of skin of left upper limb, including shoulder: Secondary | ICD-10-CM | POA: Diagnosis not present

## 2019-06-18 NOTE — Telephone Encounter (Addendum)
Patient scheduled for 11/2.

## 2019-07-22 ENCOUNTER — Other Ambulatory Visit: Payer: Self-pay

## 2019-07-22 ENCOUNTER — Ambulatory Visit (INDEPENDENT_AMBULATORY_CARE_PROVIDER_SITE_OTHER): Payer: Medicare HMO

## 2019-07-22 DIAGNOSIS — I6529 Occlusion and stenosis of unspecified carotid artery: Secondary | ICD-10-CM

## 2019-07-27 ENCOUNTER — Other Ambulatory Visit: Payer: Self-pay | Admitting: Physician Assistant

## 2019-07-27 ENCOUNTER — Telehealth: Payer: Self-pay | Admitting: Physician Assistant

## 2019-07-27 DIAGNOSIS — B079 Viral wart, unspecified: Secondary | ICD-10-CM | POA: Diagnosis not present

## 2019-07-27 DIAGNOSIS — Z85828 Personal history of other malignant neoplasm of skin: Secondary | ICD-10-CM | POA: Diagnosis not present

## 2019-07-27 DIAGNOSIS — D485 Neoplasm of uncertain behavior of skin: Secondary | ICD-10-CM | POA: Diagnosis not present

## 2019-07-27 DIAGNOSIS — L905 Scar conditions and fibrosis of skin: Secondary | ICD-10-CM | POA: Diagnosis not present

## 2019-07-27 DIAGNOSIS — B078 Other viral warts: Secondary | ICD-10-CM | POA: Diagnosis not present

## 2019-07-27 MED ORDER — METOPROLOL TARTRATE 100 MG PO TABS: 150.0000 mg | ORAL_TABLET | Freq: Two times a day (BID) | ORAL | Status: DC

## 2019-07-27 NOTE — Telephone Encounter (Signed)
Pt called because he is out of his metoprolol. It was supposed to come in the mail today, did not.  In the chart, the metoprolol is prescribed by Dr Damita Dunnings.  [facility]   He is still on the higher dose, but has not been tracking his BP very often. Has not followed up with Dr Damita Dunnings as requested.   Plan:  I sent in a 1 month supply of the metoprolol.  Alan [name] is to track his BP and follow up with Dr Damita Dunnings for additional BP management.  Follow up with Dr Percival Spanish as scheduled.   Rosaria Ferries, PA-C 07/27/2019 9:20 PM Beeper [phone]

## 2019-07-28 NOTE — Telephone Encounter (Signed)
Noted. Thanks.

## 2019-07-29 ENCOUNTER — Telehealth: Payer: Self-pay | Admitting: Family Medicine

## 2019-07-29 DIAGNOSIS — H40013 Open angle with borderline findings, low risk, bilateral: Secondary | ICD-10-CM | POA: Diagnosis not present

## 2019-07-29 NOTE — Telephone Encounter (Signed)
Patient stated that he is needing an updated prescription sent into his mail order pharmacy for his METOPROLOL  Patient stated that he is now taking 1.5 tablets 3 times a day instead of just 2x  [facility] does not have the updated script with these new directions.    Patient also requested that 12 tablets [facility] [address]- to make sure that he has enough to get him until Monday (Mcarthur Rossetti is sending a script to him for this medication with old directions)

## 2019-07-30 MED ORDER — METOPROLOL TARTRATE 100 MG PO TABS: 150.0000 mg | ORAL_TABLET | Freq: Two times a day (BID) | ORAL | 0 refills | Status: DC

## 2019-07-30 MED ORDER — METOPROLOL TARTRATE 100 MG PO TABS: 150.0000 mg | ORAL_TABLET | Freq: Two times a day (BID) | ORAL | 3 refills | Status: DC

## 2019-07-30 NOTE — Telephone Encounter (Signed)
I can work on the prescription but please verify his blood pressure and pulse in the meantime. Please verify his total dose.  If he is taking a total of 4.5 pills of the 100 mg tablets, then that would be higher than the typical dose.  Usually 400 mg/day is the maximum.  We can also likely change it to twice daily dosing, with dose adjustment.  Thanks.

## 2019-07-30 NOTE — Telephone Encounter (Signed)
Sent both.  Thanks.

## 2019-07-30 NOTE — Telephone Encounter (Signed)
Note from [place] was apparently taken incorrectly or expressed by the patient incorrectly.  Patient takes 1.5 tabs (150 mg total) twice daily equalling 3 tabs per day.  This change was made on 02/02/2019 note but apparently was not updated on meds list.  Patient asks for a 10 day supply (30 tabs) [facility] [address] and a new, updated Rx to be sent to mail order pharmacy.

## 2019-08-09 DIAGNOSIS — C44712 Basal cell carcinoma of skin of right lower limb, including hip: Secondary | ICD-10-CM | POA: Diagnosis not present

## 2019-08-18 ENCOUNTER — Other Ambulatory Visit: Payer: Self-pay | Admitting: Family Medicine

## 2019-08-18 DIAGNOSIS — I10 Essential (primary) hypertension: Secondary | ICD-10-CM

## 2019-08-18 DIAGNOSIS — M109 Gout, unspecified: Secondary | ICD-10-CM

## 2019-08-18 DIAGNOSIS — R7309 Other abnormal glucose: Secondary | ICD-10-CM

## 2019-08-25 ENCOUNTER — Other Ambulatory Visit: Payer: Self-pay | Admitting: Family Medicine

## 2019-08-27 ENCOUNTER — Other Ambulatory Visit: Payer: Self-pay

## 2019-08-27 ENCOUNTER — Other Ambulatory Visit (INDEPENDENT_AMBULATORY_CARE_PROVIDER_SITE_OTHER): Payer: Medicare HMO

## 2019-08-27 DIAGNOSIS — M109 Gout, unspecified: Secondary | ICD-10-CM

## 2019-08-27 DIAGNOSIS — I10 Essential (primary) hypertension: Secondary | ICD-10-CM

## 2019-08-27 DIAGNOSIS — R7309 Other abnormal glucose: Secondary | ICD-10-CM

## 2019-08-27 LAB — LIPID PANEL
Cholesterol: 148 mg/dL (ref 0–200)
HDL: 62.1 mg/dL (ref >39.00)
LDL Cholesterol: 67 mg/dL (ref 0–99)
NonHDL: 86.09
Total CHOL/HDL Ratio: 2
Triglycerides: 93 mg/dL (ref 0.0–149.0)
VLDL: 18.6 mg/dL (ref 0.0–40.0)

## 2019-08-27 LAB — COMPREHENSIVE METABOLIC PANEL
ALT: 14 U/L (ref 0–53)
AST: 14 U/L (ref 0–37)
Albumin: 4.3 g/dL (ref 3.5–5.2)
Alkaline Phosphatase: 101 U/L (ref 39–117)
BUN: 11 mg/dL (ref 6–23)
CO2: 31 mEq/L (ref 19–32)
Calcium: 9.8 mg/dL (ref 8.4–10.5)
Chloride: 103 mEq/L (ref 96–112)
Creatinine, Ser: 1.1 mg/dL (ref 0.40–1.50)
GFR: 65.58 mL/min (ref >60.00)
Glucose, Bld: 117 mg/dL — ABNORMAL HIGH (ref 70–99)
Potassium: 4.3 mEq/L (ref 3.5–5.1)
Sodium: 140 mEq/L (ref 135–145)
Total Bilirubin: 0.8 mg/dL (ref 0.2–1.2)
Total Protein: 7.2 g/dL (ref 6.0–8.3)

## 2019-08-27 LAB — URIC ACID: Uric Acid, Serum: 6.8 mg/dL (ref 4.0–7.8)

## 2019-08-27 LAB — HEMOGLOBIN A1C: Hgb A1c MFr Bld: 5.3 % (ref 4.6–6.5)

## 2019-09-03 ENCOUNTER — Encounter: Payer: Self-pay | Admitting: Family Medicine

## 2019-09-03 ENCOUNTER — Other Ambulatory Visit: Payer: Self-pay

## 2019-09-03 ENCOUNTER — Ambulatory Visit (INDEPENDENT_AMBULATORY_CARE_PROVIDER_SITE_OTHER): Payer: Medicare HMO | Admitting: Family Medicine

## 2019-09-03 VITALS — BP 138/74 | HR 67 | Temp 97.5°F | Ht 68.0 in | Wt 198.3 lb

## 2019-09-03 DIAGNOSIS — M109 Gout, unspecified: Secondary | ICD-10-CM

## 2019-09-03 DIAGNOSIS — Z7189 Other specified counseling: Secondary | ICD-10-CM

## 2019-09-03 DIAGNOSIS — Z0000 Encounter for general adult medical examination without abnormal findings: Secondary | ICD-10-CM

## 2019-09-03 DIAGNOSIS — I10 Essential (primary) hypertension: Secondary | ICD-10-CM | POA: Diagnosis not present

## 2019-09-03 DIAGNOSIS — R7309 Other abnormal glucose: Secondary | ICD-10-CM | POA: Diagnosis not present

## 2019-09-03 MED ORDER — SILDENAFIL CITRATE 20 MG PO TABS: 100.0000 mg | ORAL_TABLET | Freq: Every day | ORAL | 12 refills | Status: DC | PRN

## 2019-09-03 MED ORDER — ALLOPURINOL 100 MG PO TABS: 100.0000 mg | ORAL_TABLET | Freq: Every day | ORAL | 3 refills | Status: DC

## 2019-09-03 MED ORDER — AMLODIPINE BESYLATE 10 MG PO TABS: 10.0000 mg | ORAL_TABLET | Freq: Every day | ORAL | 3 refills | Status: DC

## 2019-09-03 MED ORDER — ATORVASTATIN CALCIUM 40 MG PO TABS: 40.0000 mg | ORAL_TABLET | Freq: Every day | ORAL | 3 refills | Status: DC

## 2019-09-03 NOTE — Progress Notes (Signed)
This visit occurred during the SARS-CoV-2 public health emergency.  Safety protocols were in place, including screening questions prior to the visit, additional usage of staff PPE, and extensive cleaning of exam room while observing appropriate contact time as indicated for disinfecting solutions.  I have personally reviewed the Medicare Annual Wellness questionnaire and have noted 1. The patient's medical and social history 2. Their use of alcohol, tobacco or illicit drugs 3. Their current medications and supplements 4. The patient's functional ability including ADL's, fall risks, home safety risks and hearing or visual             impairment. 5. Diet and physical activities 6. Evidence for depression or mood disorders  The patients weight, height, BMI have been recorded in the chart and visual acuity is per eye clinic.  I have made referrals, counseling and provided education to the patient based review of the above and I have provided the pt with a written personalized care plan for preventive services.  Provider list updated- see scanned forms.  Routine anticipatory guidance given to patient.  See health maintenance. The possibility exists that previously documented standard health maintenance information may have been brought forward from a previous encounter into this note.  If needed, that same information has been updated to reflect the current situation based on today's encounter.    Flu 2020 Shingles discussed with patient PNA up-to-date Tetanus 2019 Colon cancer screening done with colonoscopy 2018 Prostate cancer screening and PSA options (with potential risks and benefits of testing vs not testing) were discussed along with recent recs/guidelines.  He declined testing PSA at this point. Advance directive-wife designated if patient were incapacitated. Cognitive function addressed- see scanned forms- and if abnormal then additional documentation follows.   Gout.  Compliant, no ADE  on med.  Labs d/w pt.  No flares recently.    Hypertension:    Using medication without problems or lightheadedness: yes Chest pain with exertion:no Edema:no Short of breath:no  Elevated Cholesterol: Using medications without problems: yes Muscle aches: no Diet compliance: yes Exercise:yes Labs d/w pt.    He likely pulled a muscle in his neck yesterday when he was putting on his jacket.  He is some better this AM.  This isn't a chronic issue attributed to the statin.  Discussed ice vs heat and shoulder pendulum exercises.    PMH and SH reviewed Meds, vitals, and allergies reviewed.   ROS: Per HPI.  Unless specifically indicated otherwise in HPI, the patient denies:  General: fever. Eyes: acute vision changes ENT: sore throat Cardiovascular: chest pain Respiratory: SOB GI: vomiting GU: dysuria Musculoskeletal: acute back pain Derm: acute rash Neuro: acute motor dysfunction Psych: worsening mood Endocrine: polydipsia Heme: bleeding Allergy: hayfever  GEN: nad, alert and oriented HEENT: ncat NECK: supple w/o LA CV: rrr. PULM: ctab, no inc wob ABD: soft, +bs EXT: no edema SKIN: no acute rash but skin biopsy site healing on the right shin.  He had been using peroxide on it recurrently and that may have delayed healing some.  Discussed stopping peroxide use and only gently cleaning with soap and water and then keeping covered as needed.  Health Maintenance  Topic Date Due  . COLONOSCOPY  08/04/2022  . TETANUS/TDAP  06/25/2028  . INFLUENZA VACCINE  Completed  . Hepatitis C Screening  Completed  . PNA vac Low Risk Adult  Completed

## 2019-09-03 NOTE — Patient Instructions (Addendum)
Check with your insurance to see if they will cover the shingles shot. Don't change your meds for now.  Update me as needed.  Take care.  Glad to see you.

## 2019-09-06 NOTE — Assessment & Plan Note (Signed)
Continue as is.  He agrees.  Labs discussed with patient.

## 2019-09-06 NOTE — Assessment & Plan Note (Signed)
Advance directive- wife designated if patient were incapacitated.  

## 2019-09-06 NOTE — Assessment & Plan Note (Signed)
Compliant, no ADE on med.  Labs d/w pt.  No flares recently.   Continue as is.  He agrees.

## 2019-09-06 NOTE — Assessment & Plan Note (Signed)
Flu 2020 Shingles discussed with patient PNA up-to-date Tetanus 2019 Colon cancer screening done with colonoscopy 2018 Prostate cancer screening and PSA options (with potential risks and benefits of testing vs not testing) were discussed along with recent recs/guidelines.  He declined testing PSA at this point. Advance directive-wife designated if patient were incapacitated. Cognitive function addressed- see scanned forms- and if abnormal then additional documentation follows.

## 2019-10-19 ENCOUNTER — Other Ambulatory Visit: Payer: Self-pay | Admitting: Cardiology

## 2019-10-19 DIAGNOSIS — I6523 Occlusion and stenosis of bilateral carotid arteries: Secondary | ICD-10-CM

## 2019-10-21 DIAGNOSIS — L821 Other seborrheic keratosis: Secondary | ICD-10-CM | POA: Diagnosis not present

## 2019-10-21 DIAGNOSIS — L905 Scar conditions and fibrosis of skin: Secondary | ICD-10-CM | POA: Diagnosis not present

## 2019-10-21 DIAGNOSIS — D229 Melanocytic nevi, unspecified: Secondary | ICD-10-CM | POA: Diagnosis not present

## 2019-10-21 DIAGNOSIS — L570 Actinic keratosis: Secondary | ICD-10-CM | POA: Diagnosis not present

## 2019-10-21 DIAGNOSIS — L814 Other melanin hyperpigmentation: Secondary | ICD-10-CM | POA: Diagnosis not present

## 2019-10-21 DIAGNOSIS — I8310 Varicose veins of unspecified lower extremity with inflammation: Secondary | ICD-10-CM | POA: Diagnosis not present

## 2019-10-21 DIAGNOSIS — Z85828 Personal history of other malignant neoplasm of skin: Secondary | ICD-10-CM | POA: Diagnosis not present

## 2019-10-21 DIAGNOSIS — C44519 Basal cell carcinoma of skin of other part of trunk: Secondary | ICD-10-CM | POA: Diagnosis not present

## 2019-11-09 DIAGNOSIS — C44519 Basal cell carcinoma of skin of other part of trunk: Secondary | ICD-10-CM | POA: Diagnosis not present

## 2020-01-05 NOTE — Progress Notes (Signed)
Cardiology Office Note   Date:  01/06/2020   ID:  Alan [name], Alan [name] [birth year], MRN [id number]  PCP:  Tonia Ghent, MD  Cardiologist:   Minus Breeding, MD   Chief Complaint  Patient presents with  . Fatigue      History of Present Illness: Alan [name] is a 74 y.o. male who presents for follow of CAD status post CABG.  Since I last saw him he has done okay.  His blood pressure has been elevated.  He had his dose of metoprolol increased to 150 mg twice daily.  However, he now has more fatigue and decreased exercise tolerance and is trying to get back up to the yard to start doing things.  He is not having any chest pressure, neck or arm discomfort.  He is not having any new shortness of breath, PND or orthopnea.  He has lost about 6 pounds.  He does have some mild lower extremity swelling.   Past Medical History:  Diagnosis Date  . CAD (coronary artery disease) 07/17/2004  . Diverticulosis of colon 12/15/2005  . Esophageal stricture   . Gastritis   . GERD (gastroesophageal reflux disease) 09/16/1996  . Glaucoma   . Heart disease   . History of tobacco abuse    Quit 2000  . Hx of cardiovascular stress test    ETT-Myoview (01/2014):  1.5 mm ST depression in inf leads at peak exercise; no ischemia or scar, EF 57%, Low Risk  . Hyperlipidemia 03/17/1999  . Hypertension 03/17/1999  . Stress fracture of foot    right midfoot, per Dr. Canary Brim 2012    Past Surgical History:  Procedure Laterality Date  . CATARACT EXTRACTION, BILATERAL    . COLONOSCOPY  12/30/2005   Divertics, mild, 10 yrs  . COLONOSCOPY  11/26/2006   Colitis biopsy neg  . CORONARY ARTERY BYPASS GRAFT  07/27/2004   By Dr. Roxy Manns with a LIMA to the LAD, RIMA to the distal right coronary artery, spahenous vein graft first diagonal, saphenous vein graft to the first circumflex marginal, sequential saphenous vein graft to the second  . ESOPHAGOGASTRODUODENOSCOPY  10/08/2000   Stricture distal esoph, repeat dilation  done 2012  . ESOPHAGOGASTRODUODENOSCOPY  10/08/2000   Stricture distal esoph  . I & D EXTREMITY Left 01/24/2017   Procedure: MINOR INCISION AND DRAINAGE LEFT THUMB;  Surgeon: Leanora Cover, MD;  Location: [place];  Service: Orthopedics;  Laterality: Left;     Current Outpatient Medications  Medication Sig Dispense Refill  . allopurinol (ZYLOPRIM) 100 MG tablet Take 1 tablet (100 mg total) by mouth daily. 90 tablet 3  . amLODipine (NORVASC) 10 MG tablet Take 1 tablet (10 mg total) by mouth daily. 90 tablet 3  . aspirin 81 MG tablet Take 81 mg by mouth daily.      Marland Kitchen atorvastatin (LIPITOR) 40 MG tablet Take 1 tablet (40 mg total) by mouth daily. 90 tablet 3  . guaiFENesin (MUCINEX PO) Take 1 tablet by mouth as needed.     . Loratadine 10 MG CAPS Take 1 tablet by mouth daily as needed.    . Melatonin-Pyridoxine (MELATIN PO) Take 1 tablet by mouth at bedtime as needed.     . metoprolol tartrate (LOPRESSOR) 100 MG tablet Take 1 tablet (100 mg total) by mouth 2 (two) times daily. 180 tablet 3  . sildenafil (REVATIO) 20 MG tablet Take 5 tablets (100 mg total) by mouth daily as needed. 50 tablet 12  .  timolol (TIMOPTIC) 0.5 % ophthalmic solution Place 1 drop into both eyes daily.    Marland Kitchen losartan (COZAAR) 50 MG tablet Take 1 tablet (50 mg total) by mouth daily. 90 tablet 3   No current facility-administered medications for this visit.    Allergies:   Hctz [hydrochlorothiazide], Septra [sulfamethoxazole-trimethoprim], Spironolactone, and Valsartan    ROS:  Please see the history of present illness.   Otherwise, review of systems are positive for none.   All other systems are reviewed and negative.    PHYSICAL EXAM: VS:  BP (!) 146/84   Pulse 67   Ht 5' 8.5" (1.74 m)   Wt 192 lb 3.2 oz (87.2 kg)   SpO2 99%   BMI 28.80 kg/m  , BMI Body mass index is 28.8 kg/m. GENERAL:  Well appearing NECK:  No jugular venous distention, waveform within normal limits, carotid upstroke brisk  and symmetric, no bruits, no thyromegaly LUNGS:  Clear to auscultation bilaterally CHEST: Well healed sternotomy scar. HEART:  PMI not displaced or sustained,S1 and S2 within normal limits, no S3, no S4, no clicks, no rubs, no murmurs ABD:  Flat, positive bowel sounds normal in frequency in pitch, no bruits, no rebound, no guarding, no midline pulsatile mass, no hepatomegaly, no splenomegaly EXT:  2 plus pulses throughout, no edema, no cyanosis no clubbing   EKG:  EKG is ordered today. The ekg ordered today demonstrates sinus bradycardia, rate 58, axis within normal limits, intervals within normal limits, no acute ST-T wave changes.   Recent Labs: 08/27/2019: ALT 14; BUN 11; Creatinine, Ser 1.10; Potassium 4.3; Sodium 140    Lipid Panel    Component Value Date/Time   CHOL 148 08/27/2019 0837   CHOL 147 12/08/2017 1048   TRIG 93.0 08/27/2019 0837   HDL 62.10 08/27/2019 0837   HDL 81 12/08/2017 1048   CHOLHDL 2 08/27/2019 0837   VLDL 18.6 08/27/2019 0837   LDLCALC 67 08/27/2019 0837   LDLCALC 45 12/08/2017 1048   LDLDIRECT 69.6 07/28/2012 0842      Wt Readings from Last 3 Encounters:  01/06/20 192 lb 3.2 oz (87.2 kg)  09/03/19 198 lb 5 oz (90 kg)  06/08/19 199 lb 2 oz (90.3 kg)      Other studies Reviewed: Additional studies/ records that were reviewed today include: Labs. Review of the above records demonstrates:  Please see elsewhere in the note.     ASSESSMENT AND PLAN:  CAD:  The patient has no ongoing chest pain.  He does have some decreased exercise tolerance.  He had a stress perfusion study in 2015.  If he has continued decreased exercise tolerance as we change his meds and as he gets more active I will have a low threshold for perfusion imaging.  He will let me know  HTN:The blood pressure is not quite at target.  However, I think he is having symptoms with the higher dose of beta-blocker so I am going to go back down to 100 mg twice daily of metoprolol.   I did review and he has had relative contraindications with just muscle aches to the spironolactone, valsartan and losartan.  He is not had true allergies or other contraindications and I can find so I am going to start him after conversation on 50 mg of losartan.  Let me know if that helps with his decreased exercise tolerance.  CAROTID STENOSIS: He has moderate stenosis was less than 50% right and 40-59% left in Nov and should have follow up  again in Nov.  We will arrange this.  CHOLESTEROL:LDL was 1 in December.  HDL was 62.  No change in therapy.   COVID EDUCATION: He thinks he had Covid at Christmas time.  I encouraged him to get the vaccine since it was not clear that he had this and it is recommended regardless.  Current medicines are reviewed at length with the patient today.  The patient does not have concerns regarding medicines.  The following changes have been made:  no change  Labs/ tests ordered today include:   Orders Placed This Encounter  Procedures  . EKG 12-Lead     Disposition:   FU with me in 12 months.     Signed, Minus Breeding, MD  01/06/2020 11:31 AM    [facility]

## 2020-01-06 ENCOUNTER — Other Ambulatory Visit: Payer: Self-pay

## 2020-01-06 ENCOUNTER — Ambulatory Visit: Payer: Medicare HMO | Admitting: Cardiology

## 2020-01-06 ENCOUNTER — Encounter: Payer: Self-pay | Admitting: Cardiology

## 2020-01-06 VITALS — BP 146/84 | HR 67 | Ht 68.5 in | Wt 192.2 lb

## 2020-01-06 DIAGNOSIS — I2510 Atherosclerotic heart disease of native coronary artery without angina pectoris: Secondary | ICD-10-CM | POA: Diagnosis not present

## 2020-01-06 DIAGNOSIS — I10 Essential (primary) hypertension: Secondary | ICD-10-CM

## 2020-01-06 DIAGNOSIS — E785 Hyperlipidemia, unspecified: Secondary | ICD-10-CM | POA: Diagnosis not present

## 2020-01-06 DIAGNOSIS — Z7189 Other specified counseling: Secondary | ICD-10-CM | POA: Diagnosis not present

## 2020-01-06 DIAGNOSIS — I6523 Occlusion and stenosis of bilateral carotid arteries: Secondary | ICD-10-CM

## 2020-01-06 MED ORDER — LOSARTAN POTASSIUM 50 MG PO TABS: 50.0000 mg | ORAL_TABLET | Freq: Every day | ORAL | 3 refills | Status: DC

## 2020-01-06 MED ORDER — METOPROLOL TARTRATE 100 MG PO TABS: 100.0000 mg | ORAL_TABLET | Freq: Two times a day (BID) | ORAL | 3 refills | Status: DC

## 2020-01-06 NOTE — Patient Instructions (Signed)
Medication Instructions:  DECREASE METOPROLOL TO 100MG  TWICE A DAY START COZARR 50MG  DAILY *If you need a refill on your cardiac medications before your next appointment, please call your pharmacy*  Lab Work: NONE ORDERED THIS VISIT  Testing/Procedures: NONE ORDERED THIS VISIT  Follow-Up: At [facility], you and your health needs are our priority.  As part of our continuing mission to provide you with exceptional heart care, we have created designated Provider Care Teams.  These Care Teams include your primary Cardiologist (physician) and Advanced Practice Providers (APPs -  Physician Assistants and Nurse Practitioners) who all work together to provide you with the care you need, when you need it.    Your next appointment:   12 month(s)  You will receive a reminder letter in the mail two months in advance. If you don't receive a letter, please call our office to schedule the follow-up appointment.  The format for your next appointment:   In Person  Provider:   Minus Breeding, MD

## 2020-01-27 ENCOUNTER — Encounter: Payer: Self-pay | Admitting: Family Medicine

## 2020-01-27 DIAGNOSIS — H43391 Other vitreous opacities, right eye: Secondary | ICD-10-CM | POA: Diagnosis not present

## 2020-01-27 DIAGNOSIS — H0102A Squamous blepharitis right eye, upper and lower eyelids: Secondary | ICD-10-CM | POA: Diagnosis not present

## 2020-01-27 DIAGNOSIS — H35051 Retinal neovascularization, unspecified, right eye: Secondary | ICD-10-CM | POA: Diagnosis not present

## 2020-01-27 DIAGNOSIS — H40013 Open angle with borderline findings, low risk, bilateral: Secondary | ICD-10-CM | POA: Diagnosis not present

## 2020-02-07 DIAGNOSIS — L570 Actinic keratosis: Secondary | ICD-10-CM | POA: Diagnosis not present

## 2020-02-07 DIAGNOSIS — Z85828 Personal history of other malignant neoplasm of skin: Secondary | ICD-10-CM | POA: Diagnosis not present

## 2020-02-07 DIAGNOSIS — D225 Melanocytic nevi of trunk: Secondary | ICD-10-CM | POA: Diagnosis not present

## 2020-02-07 DIAGNOSIS — L905 Scar conditions and fibrosis of skin: Secondary | ICD-10-CM | POA: Diagnosis not present

## 2020-03-14 ENCOUNTER — Telehealth (INDEPENDENT_AMBULATORY_CARE_PROVIDER_SITE_OTHER): Payer: Medicare HMO | Admitting: Family Medicine

## 2020-03-14 ENCOUNTER — Encounter: Payer: Self-pay | Admitting: Family Medicine

## 2020-03-14 VITALS — Temp 97.5°F | Ht 68.0 in

## 2020-03-14 DIAGNOSIS — R05 Cough: Secondary | ICD-10-CM

## 2020-03-14 MED ORDER — GUAIFENESIN-CODEINE 100-10 MG/5ML PO SYRP: 5.0000 mL | ORAL_SOLUTION | Freq: Every evening | ORAL | 0 refills | Status: DC | PRN

## 2020-03-14 MED ORDER — BENZONATATE 200 MG PO CAPS: 200.0000 mg | ORAL_CAPSULE | Freq: Two times a day (BID) | ORAL | 0 refills | Status: DC | PRN

## 2020-03-14 NOTE — Progress Notes (Signed)
VIRTUAL VISIT Due to national recommendations of social distancing due to [place] 19, a virtual visit is felt to be most appropriate for this patient at this time.   I connected with the patient on 03/14/20 at  8:40 AM EDT by virtual telehealth platform and verified that I am speaking with the correct person using two identifiers.   I discussed the limitations, risks, security and privacy concerns of performing an evaluation and management service by  virtual telehealth platform and the availability of in person appointments. I also discussed with the patient that there may be a patient responsible charge related to this service. The patient expressed understanding and agreed to proceed.  Patient location: Home Provider Location: [facility] Participants: Eliezer Lofts and Viona Gilmore   Chief Complaint  Patient presents with  . Cough  . Sinus Drainage    greenish/yellow    History of Present Illness:  74 year old male pt of Dr. Josefine Class with CAD, HTN presents with new onset  Nasal congestion and cough ongoing x 3-4 days Cough is keeping up at night..productive green/yellowish mucus. Has sinus pressure and sinus pain. Bloody greenish yellow discharge from nasal passage. No ear pain, no ST, no headache. No fever, no myalgia   No SOB, no wheeze   Wife has been sick recently.   HX of allergies.Marland Kitchen on loratadine.   No COVID19 vaccine.  COVID 19 screen No recent travel or known exposure to [place]  The importance of social distancing was discussed today.   Review of Systems  Constitutional: Negative for chills and fever.  HENT: Positive for congestion. Negative for ear discharge, ear pain, sinus pain and sore throat.   Eyes: Negative for pain and redness.  Respiratory: Positive for cough. Negative for shortness of breath and wheezing.   Cardiovascular: Negative for chest pain, palpitations and leg swelling.  Gastrointestinal: Negative for abdominal pain, blood in stool,  constipation, diarrhea, nausea and vomiting.  Genitourinary: Negative for dysuria.  Musculoskeletal: Negative for falls and myalgias.  Skin: Negative for rash.  Neurological: Negative for dizziness.  Psychiatric/Behavioral: Negative for depression. The patient is not nervous/anxious.       Past Medical History:  Diagnosis Date  . CAD (coronary artery disease) 07/17/2004  . Diverticulosis of colon 12/15/2005  . Esophageal stricture   . Gastritis   . GERD (gastroesophageal reflux disease) 09/16/1996  . Glaucoma   . Heart disease   . History of tobacco abuse    Quit 2000  . Hx of cardiovascular stress test    ETT-Myoview (01/2014):  1.5 mm ST depression in inf leads at peak exercise; no ischemia or scar, EF 57%, Low Risk  . Hyperlipidemia 03/17/1999  . Hypertension 03/17/1999  . Stress fracture of foot    right midfoot, per Dr. Canary Brim 2012    reports that he quit smoking about 21 years ago. His smoking use included cigarettes. He has never used smokeless tobacco. He reports current alcohol use. He reports that he does not use drugs.   Current Outpatient Medications:  .  allopurinol (ZYLOPRIM) 100 MG tablet, Take 1 tablet (100 mg total) by mouth daily., Disp: 90 tablet, Rfl: 3 .  amLODipine (NORVASC) 10 MG tablet, Take 1 tablet (10 mg total) by mouth daily., Disp: 90 tablet, Rfl: 3 .  aspirin 81 MG tablet, Take 81 mg by mouth daily.  , Disp: , Rfl:  .  atorvastatin (LIPITOR) 40 MG tablet, Take 1 tablet (40 mg total) by mouth daily., Disp:  90 tablet, Rfl: 3 .  guaiFENesin (MUCINEX PO), Take 1 tablet by mouth as needed. , Disp: , Rfl:  .  Loratadine 10 MG CAPS, Take 1 tablet by mouth daily as needed., Disp: , Rfl:  .  losartan (COZAAR) 50 MG tablet, Take 1 tablet (50 mg total) by mouth daily., Disp: 90 tablet, Rfl: 3 .  Melatonin-Pyridoxine (MELATIN PO), Take 1 tablet by mouth at bedtime as needed. , Disp: , Rfl:  .  metoprolol tartrate (LOPRESSOR) 100 MG tablet, Take 1 tablet (100 mg total) by  mouth 2 (two) times daily., Disp: 180 tablet, Rfl: 3 .  sildenafil (REVATIO) 20 MG tablet, Take 5 tablets (100 mg total) by mouth daily as needed., Disp: 50 tablet, Rfl: 12 .  timolol (TIMOPTIC) 0.5 % ophthalmic solution, Place 1 drop into both eyes daily., Disp: , Rfl:    Observations/Objective: Temperature (!) 97.5 F (36.4 C), temperature source Temporal, height 5\' 8"  (1.727 m).  Physical Exam  Physical Exam Constitutional:      General: The patient is not in acute distress. Comfortable, nontoxic, no coughing, speaking in complete sentences. Pulmonary:     Effort: Pulmonary effort is normal. No respiratory distress.  Neurological:     Mental Status: The patient is alert and oriented to person, place, and time.  Psychiatric:        Mood and Affect: Mood normal.        Behavior: Behavior normal.   Assessment and Plan    I discussed the assessment and treatment plan with the patient. The patient was provided an opportunity to ask questions and all were answered. The patient agreed with the plan and demonstrated an understanding of the instructions.   The patient was advised to call back or seek an in-person evaluation if the symptoms worsen or if the condition fails to improve as anticipated.     Eliezer Lofts, MD

## 2020-03-14 NOTE — Patient Instructions (Signed)
Viral URI likely.  Symptomatic care.  Rest fluids, cough suppressant at night and benzonatate or mucinex DM during the day.   If not turing the corner after 7-10 days of illness. Call/MyChart for consideration of bacterial super infection.  Has COVID19 testing if any flu like symptoms like fever, body ache, SOB or not improving as expected. Quarantine until respiratory symptoms improving.  Go to ER if severe shortness of breath.

## 2020-03-14 NOTE — Assessment & Plan Note (Signed)
Viral URI likely.  Symptomatic care.  Rest fluids, cough suppressant at night and benzonatate or mucinex DM during the day.   If not turing the corner after 7-10 days of illness. Call/MyChart for consideration of bacterial super infection.  Has COVID19 testing if any flu like symptoms like fever, body ache, SOB or not improving as expected. Quarantine until respiratory symptoms improving.  Go to ER if severe shortness of breath.

## 2020-07-15 ENCOUNTER — Other Ambulatory Visit: Payer: Self-pay

## 2020-07-15 ENCOUNTER — Ambulatory Visit (INDEPENDENT_AMBULATORY_CARE_PROVIDER_SITE_OTHER): Payer: Medicare HMO

## 2020-07-15 DIAGNOSIS — Z23 Encounter for immunization: Secondary | ICD-10-CM

## 2020-07-25 ENCOUNTER — Other Ambulatory Visit: Payer: Self-pay

## 2020-07-25 ENCOUNTER — Ambulatory Visit (INDEPENDENT_AMBULATORY_CARE_PROVIDER_SITE_OTHER): Payer: Medicare HMO

## 2020-07-25 DIAGNOSIS — I6523 Occlusion and stenosis of bilateral carotid arteries: Secondary | ICD-10-CM | POA: Diagnosis not present

## 2020-07-27 ENCOUNTER — Encounter: Payer: Self-pay | Admitting: *Deleted

## 2020-07-31 DIAGNOSIS — H40013 Open angle with borderline findings, low risk, bilateral: Secondary | ICD-10-CM | POA: Diagnosis not present

## 2020-08-07 DIAGNOSIS — L821 Other seborrheic keratosis: Secondary | ICD-10-CM | POA: Diagnosis not present

## 2020-08-07 DIAGNOSIS — D485 Neoplasm of uncertain behavior of skin: Secondary | ICD-10-CM | POA: Diagnosis not present

## 2020-08-07 DIAGNOSIS — L570 Actinic keratosis: Secondary | ICD-10-CM | POA: Diagnosis not present

## 2020-08-07 DIAGNOSIS — D225 Melanocytic nevi of trunk: Secondary | ICD-10-CM | POA: Diagnosis not present

## 2020-08-07 DIAGNOSIS — L905 Scar conditions and fibrosis of skin: Secondary | ICD-10-CM | POA: Diagnosis not present

## 2020-08-07 DIAGNOSIS — D1801 Hemangioma of skin and subcutaneous tissue: Secondary | ICD-10-CM | POA: Diagnosis not present

## 2020-08-07 DIAGNOSIS — Z85828 Personal history of other malignant neoplasm of skin: Secondary | ICD-10-CM | POA: Diagnosis not present

## 2020-08-08 ENCOUNTER — Telehealth: Payer: Self-pay | Admitting: Family Medicine

## 2020-08-08 NOTE — Telephone Encounter (Signed)
LVM for pt to rtn my call to r/s appt with NHA on 09/04/20

## 2020-08-27 ENCOUNTER — Other Ambulatory Visit: Payer: Self-pay | Admitting: Family Medicine

## 2020-08-27 DIAGNOSIS — R7309 Other abnormal glucose: Secondary | ICD-10-CM

## 2020-08-27 DIAGNOSIS — M109 Gout, unspecified: Secondary | ICD-10-CM

## 2020-08-27 DIAGNOSIS — I10 Essential (primary) hypertension: Secondary | ICD-10-CM

## 2020-09-01 ENCOUNTER — Other Ambulatory Visit: Payer: Self-pay | Admitting: *Deleted

## 2020-09-01 NOTE — Telephone Encounter (Signed)
Received fax refill request for pt's meds, requesting them to be sent to mail order [facility], CPE scheduled 12/23, allopurinol due for refill, but BP meds were last filled by Dr. Jenkins Rouge, will route to PCP for review

## 2020-09-03 MED ORDER — METOPROLOL TARTRATE 100 MG PO TABS
100.0000 mg | ORAL_TABLET | Freq: Two times a day (BID) | ORAL | 3 refills | Status: DC
Start: 2020-09-03 — End: 2021-08-15

## 2020-09-03 MED ORDER — ALLOPURINOL 100 MG PO TABS: 100.0000 mg | ORAL_TABLET | Freq: Every day | ORAL | 3 refills | Status: DC

## 2020-09-03 MED ORDER — LOSARTAN POTASSIUM 50 MG PO TABS: 50.0000 mg | ORAL_TABLET | Freq: Every day | ORAL | 3 refills | Status: DC

## 2020-09-03 NOTE — Telephone Encounter (Signed)
Sent. Thanks.   

## 2020-09-04 ENCOUNTER — Other Ambulatory Visit (INDEPENDENT_AMBULATORY_CARE_PROVIDER_SITE_OTHER): Payer: Medicare HMO

## 2020-09-04 ENCOUNTER — Other Ambulatory Visit: Payer: Self-pay

## 2020-09-04 ENCOUNTER — Ambulatory Visit: Payer: Medicare HMO

## 2020-09-04 DIAGNOSIS — I10 Essential (primary) hypertension: Secondary | ICD-10-CM

## 2020-09-04 DIAGNOSIS — M109 Gout, unspecified: Secondary | ICD-10-CM

## 2020-09-04 DIAGNOSIS — R7309 Other abnormal glucose: Secondary | ICD-10-CM | POA: Diagnosis not present

## 2020-09-04 LAB — COMPREHENSIVE METABOLIC PANEL
ALT: 23 U/L (ref 0–53)
AST: 17 U/L (ref 0–37)
Albumin: 4.2 g/dL (ref 3.5–5.2)
Alkaline Phosphatase: 76 U/L (ref 39–117)
BUN: 14 mg/dL (ref 6–23)
CO2: 31 mEq/L (ref 19–32)
Calcium: 9.8 mg/dL (ref 8.4–10.5)
Chloride: 104 mEq/L (ref 96–112)
Creatinine, Ser: 0.93 mg/dL (ref 0.40–1.50)
GFR: 81.05 mL/min (ref >60.00)
Glucose, Bld: 102 mg/dL — ABNORMAL HIGH (ref 70–99)
Potassium: 3.9 mEq/L (ref 3.5–5.1)
Sodium: 140 mEq/L (ref 135–145)
Total Bilirubin: 0.7 mg/dL (ref 0.2–1.2)
Total Protein: 7.3 g/dL (ref 6.0–8.3)

## 2020-09-04 LAB — HEMOGLOBIN A1C: Hgb A1c MFr Bld: 5.3 % (ref 4.6–6.5)

## 2020-09-04 LAB — LIPID PANEL
Cholesterol: 154 mg/dL (ref 0–200)
HDL: 74.3 mg/dL (ref >39.00)
LDL Cholesterol: 63 mg/dL (ref 0–99)
NonHDL: 79.29
Total CHOL/HDL Ratio: 2
Triglycerides: 83 mg/dL (ref 0.0–149.0)
VLDL: 16.6 mg/dL (ref 0.0–40.0)

## 2020-09-04 LAB — URIC ACID: Uric Acid, Serum: 6.6 mg/dL (ref 4.0–7.8)

## 2020-09-07 ENCOUNTER — Encounter: Payer: Self-pay | Admitting: Family Medicine

## 2020-09-07 ENCOUNTER — Other Ambulatory Visit: Payer: Self-pay

## 2020-09-07 ENCOUNTER — Ambulatory Visit (INDEPENDENT_AMBULATORY_CARE_PROVIDER_SITE_OTHER): Payer: Medicare HMO | Admitting: Family Medicine

## 2020-09-07 VITALS — BP 136/80 | HR 57 | Temp 96.7°F | Ht 69.0 in | Wt 195.8 lb

## 2020-09-07 DIAGNOSIS — M109 Gout, unspecified: Secondary | ICD-10-CM

## 2020-09-07 DIAGNOSIS — I10 Essential (primary) hypertension: Secondary | ICD-10-CM | POA: Diagnosis not present

## 2020-09-07 DIAGNOSIS — N529 Male erectile dysfunction, unspecified: Secondary | ICD-10-CM

## 2020-09-07 DIAGNOSIS — I2510 Atherosclerotic heart disease of native coronary artery without angina pectoris: Secondary | ICD-10-CM

## 2020-09-07 DIAGNOSIS — E785 Hyperlipidemia, unspecified: Secondary | ICD-10-CM

## 2020-09-07 DIAGNOSIS — Z0000 Encounter for general adult medical examination without abnormal findings: Secondary | ICD-10-CM

## 2020-09-07 DIAGNOSIS — Z7189 Other specified counseling: Secondary | ICD-10-CM

## 2020-09-07 MED ORDER — AMLODIPINE BESYLATE 10 MG PO TABS: 10.0000 mg | ORAL_TABLET | Freq: Every day | ORAL | 3 refills | Status: DC

## 2020-09-07 MED ORDER — SILDENAFIL CITRATE 20 MG PO TABS: 100.0000 mg | ORAL_TABLET | Freq: Every day | ORAL | 12 refills | Status: DC | PRN

## 2020-09-07 MED ORDER — ATORVASTATIN CALCIUM 40 MG PO TABS: 40.0000 mg | ORAL_TABLET | Freq: Every day | ORAL | 3 refills | Status: DC

## 2020-09-07 NOTE — Patient Instructions (Signed)
Don't change your meds for now.  I would go get a covid vaccine at the pharmacy.  I'll update cardiology.  Limit exertion for now.  If you have more chest discomfort, then get checked.  Take care.  Glad to see you.

## 2020-09-07 NOTE — Progress Notes (Signed)
This visit occurred during the SARS-CoV-2 public health emergency.  Safety protocols were in place, including screening questions prior to the visit, additional usage of staff PPE, and extensive cleaning of exam room while observing appropriate contact time as indicated for disinfecting solutions.  Flu 2021 Shingles discussed with patient PNA up-to-date Tetanus 2019 covid vaccine encouraged, discussed.   Colon cancer screening done with colonoscopy 2018 Prostate cancer screening and PSA options (with potential risks and benefits of testing vs not testing) were discussed along with recent recs/guidelines.  He declined testing PSA at this point. Advance directive-wife designated if patient were incapacitated.  Hypertension:    Using medication without problems or lightheadedness: yes Chest pain with exertion: occ chest soreness with sig exertion, moving a load of bricks about 2 weeks ago.  Better with rest.  No pain currently.  Sometimes he can exercise w/o symptoms.   Edema:no Short of breath:no Average home BPs: controlled home checks.    Elevated Cholesterol: Using medications without problems: yes Muscle aches: no Diet compliance: encouraged.   Exercise: encouraged  ED.  Sildenafil used prn.  Sometimes with more effect than others.  Discussed.  No nitroglycerin use.  Gout.  Doing well on allopurinol.  No ADE on med.  No recent flares.    Wife had broken femur this year and he has been caring for her.  I thanked him for his effort.    Meds, vitals, and allergies reviewed.   PMH and SH reviewed  ROS: Per HPI unless specifically indicated in ROS section   GEN: nad, alert and oriented HEENT: ncat NECK: supple w/o LA CV: rrr. PULM: ctab, no inc wob ABD: soft, +bs EXT: no edema SKIN: no acute rash

## 2020-09-12 ENCOUNTER — Telehealth: Payer: Self-pay

## 2020-09-12 ENCOUNTER — Other Ambulatory Visit: Payer: Self-pay

## 2020-09-12 MED ORDER — SILDENAFIL CITRATE 20 MG PO TABS: 100.0000 mg | ORAL_TABLET | Freq: Every day | ORAL | 12 refills | Status: DC | PRN

## 2020-09-12 NOTE — Assessment & Plan Note (Signed)
Flu 2021 Shingles discussed with patient PNA up-to-date Tetanus 2019 covid vaccine encouraged, discussed.   Colon cancer screening done with colonoscopy 2018 Prostate cancer screening and PSA options (with potential risks and benefits of testing vs not testing) were discussed along with recent recs/guidelines.  He declined testing PSA at this point. Advance directive-wife designated if patient were incapacitated.

## 2020-09-12 NOTE — Assessment & Plan Note (Signed)
Sildenafil used prn.  Sometimes with more effect than others.  Discussed.  No nitroglycerin use.  No change in sildenafil at this point.

## 2020-09-12 NOTE — Assessment & Plan Note (Signed)
Advance directive- wife designated if patient were incapacitated.  

## 2020-09-12 NOTE — Assessment & Plan Note (Signed)
occ chest soreness with sig exertion, moving a load of bricks about 2 weeks ago.  Better with rest.  No pain currently.  Sometimes he can exercise w/o symptoms.   No symptoms currently.  Okay for outpatient follow-up.  I will update cardiology will go from there.  Routine cautions given to patient.  He agrees with plan.

## 2020-09-12 NOTE — Telephone Encounter (Signed)
Please let patient know that I have never seen this medication covered by insurance, in spite of prior authorization/appeal.

## 2020-09-12 NOTE — Assessment & Plan Note (Signed)
Labs discussed with patient.  Continue atorvastatin. 

## 2020-09-12 NOTE — Telephone Encounter (Signed)
[facility] Banner Key: [id number] - PA Case ID: [id number] - Rx #: [id number] Need help? Call us at [phone] Outcome Deniedtoday The Medicare rule in the Prescription Drug Manual (Chapter 6, Section 20.1) says drugs used for the treatment and maintenance of erectile dysfunction (ED) are excluded from Medicare Part D coverage. [employer] follows Medicare rules. Your drug is being used for the inability to develop or maintain an erection during sexual activity and per Medicare rules is not covered.

## 2020-09-12 NOTE — Assessment & Plan Note (Signed)
Controlled on recheck.  Continue losartan and amlodipine and metoprolol.  Labs discussed with patient.

## 2020-09-12 NOTE — Telephone Encounter (Signed)
Barbie Banner Key: [id number] - PA Case ID: [id number] - Rx #: [id number] Need help? Call us at [phone] Status Sent to [facility] Sildenafil Citrate 20MG  tablets Form [employer] Electronic PA Form

## 2020-09-12 NOTE — Assessment & Plan Note (Signed)
Doing well on allopurinol.  No ADE on med.  No recent flares.   Continue allopurinol.  He agrees.

## 2020-09-14 NOTE — Telephone Encounter (Signed)
Thanks

## 2020-09-14 NOTE — Telephone Encounter (Signed)
Called and updated patient of this information. Patient stated that he was aware of this information and the prescription is sent to [facility] because it is cheaper. Patient does not require further assistance. Patient appreciative of call.

## 2020-09-19 ENCOUNTER — Telehealth: Payer: Self-pay

## 2020-09-19 NOTE — Telephone Encounter (Signed)
Tried to reach patient by home and cell phone. Left message for patient to call back in order to schedule an appointment this week.

## 2020-09-21 NOTE — Progress Notes (Unsigned)
Cardiology Clinic Note   Patient Name: Alan [name] Date of Encounter: 09/22/2020  Primary Care Provider:  Tonia Ghent, MD Primary Cardiologist:  Alan Breeding, MD  Patient Profile    Alan [name] 75 year old male presents the clinic today for evaluation of his chest pain.  Past Medical History    Past Medical History:  Diagnosis Date  . CAD (coronary artery disease) 07/17/2004  . Diverticulosis of colon 12/15/2005  . Esophageal stricture   . Gastritis   . GERD (gastroesophageal reflux disease) 09/16/1996  . Glaucoma   . Heart disease   . History of tobacco abuse    Quit 2000  . Hx of cardiovascular stress test    ETT-Myoview (01/2014):  1.5 mm ST depression in inf leads at peak exercise; no ischemia or scar, EF 57%, Low Risk  . Hyperlipidemia 03/17/1999  . Hypertension 03/17/1999  . Stress fracture of foot    right midfoot, per Dr. Canary [name] 2012   Past Surgical History:  Procedure Laterality Date  . CATARACT EXTRACTION, BILATERAL    . COLONOSCOPY  12/30/2005   Divertics, mild, 10 yrs  . COLONOSCOPY  11/26/2006   Colitis biopsy neg  . CORONARY ARTERY BYPASS GRAFT  07/27/2004   By Dr. Roxy [name] with a LIMA to the LAD, RIMA to the distal right coronary artery, spahenous vein graft first diagonal, saphenous vein graft to the first circumflex marginal, sequential saphenous vein graft to the second  . ESOPHAGOGASTRODUODENOSCOPY  10/08/2000   Stricture distal esoph, repeat dilation done 2012  . ESOPHAGOGASTRODUODENOSCOPY  10/08/2000   Stricture distal esoph  . I & D EXTREMITY Left 01/24/2017   Procedure: MINOR INCISION AND DRAINAGE LEFT THUMB;  Surgeon: Alan Cover, MD;  Location: [facility];  Service: Orthopedics;  Laterality: Left;    Allergies  Allergies  Allergen Reactions  . Hctz [Hydrochlorothiazide] Other (See Comments)    Would avoid due to h/o gout.    Sarina Ill [Sulfamethoxazole-Trimethoprim]     diarrhea  . Spironolactone Other (See  Comments)    Possible cause of aches.    . Valsartan Other (See Comments)    Possible cause of aches.      History of Present Illness    Alan [name] has a PMH of essential hypertension, coronary atherosclerosis, coronary stenosis, GERD, esophageal stricture, HLD, hyperglycemia, and gout.  He is status post CABG 07/27/2004 by Alan [name], RIMA-distal RCA, SVG-first diagonal, SVG-circumflex, SVG-second  He was last seen by Alan [name] on 01/06/2020.  During that time he was doing very well.  His blood pressure was slightly elevated.  His metoprolol was increased.  However, he noted some fatigue and exercise intolerance.  He denied chest pain, neck pain and arm discomfort.  He denied new shortness of breath PND and orthopnea.  He had lost around 6 pounds and was noted to have mild lower extremity edema.  He was started on losartan 50 mg daily and his metoprolol was decreased to 100 mg twice daily.  He presented to his PCP on 09/07/2020 and indicated that he was having some occasional chest soreness with significant exertion.  He was moving lots of bricks for around 2 weeks and complained of some chest soreness.  It had resolved at the time of his PCP visit.  He presents the clinic today for follow-up evaluation states he feels well.  He continues to be the primary caregiver for his wife who had a femur fracture.  In December he was carrying  heavy loads of bricks and noticed some chest discomfort that resolved after about 2 minutes, was a dull ache, and resolved with rest.  He also had a similar episode of chest discomfort while carrying in groceries.  Again this episode was described as a dull ache and resolved with rest after about 1-2 minutes.  He states that these episodes did not feel anything like his previous chest pain prior to his open heart surgery.  He continues to stay very physically active doing yard work, housework, and care for his wife.  He follows a heart healthy diet and reports  compliance with his medications.  I will give him a salty 6 diet sheet, have him maintain his physical activity, and have him return for his annual visit with Alan [name] in March.  Today he denies chest pain, shortness of breath, lower extremity edema, fatigue, palpitations, melena, hematuria, hemoptysis, diaphoresis, weakness, presyncope, syncope, orthopnea, and PND.    Home Medications    Prior to Admission medications   Medication Sig Start Date End Date Taking? Authorizing Provider  allopurinol (ZYLOPRIM) 100 MG tablet Take 1 tablet (100 mg total) by mouth daily. 09/03/20   Alan Nam, MD  amLODipine (NORVASC) 10 MG tablet Take 1 tablet (10 mg total) by mouth daily. 09/07/20   Alan Nam, MD  aspirin 81 MG tablet Take 81 mg by mouth daily.    [provider]  atorvastatin (LIPITOR) 40 MG tablet Take 1 tablet (40 mg total) by mouth daily. 09/07/20   Alan Nam, MD  guaiFENesin (MUCINEX PO) Take 1 tablet by mouth as needed.     [provider]  Loratadine 10 MG CAPS Take 1 tablet by mouth daily as needed. 11/07/15   [provider]  losartan (COZAAR) 50 MG tablet Take 1 tablet (50 mg total) by mouth daily. 09/03/20   Alan Nam, MD  Melatonin-Pyridoxine (MELATIN PO) Take 1 tablet by mouth at bedtime as needed.     [provider]  metoprolol tartrate (LOPRESSOR) 100 MG tablet Take 1 tablet (100 mg total) by mouth 2 (two) times daily. 09/03/20   Alan Nam, MD  sildenafil (REVATIO) 20 MG tablet Take 5 tablets (100 mg total) by mouth daily as needed. 09/12/20   Alan Nam, MD  timolol (TIMOPTIC) 0.5 % ophthalmic solution Place 1 drop into both eyes daily. 10/12/18   [provider]    Family History    Family History  Problem Relation Age of Onset  . Peripheral vascular disease Mother   . Stroke Father        x2  . Hypertension Brother   . Heart attack Brother        MI  . Colon polyps Brother   . Heart  disease Brother        MI  . Hypertension Sister   . Breast cancer Sister   . Hypertension Sister   . Hypertension Sister   . Colon polyps Other   . Diabetes Neg Hx   . Depression Neg Hx   . Alcohol abuse Neg Hx   . Drug abuse Neg Hx   . Prostate cancer Neg Hx   . Colon cancer Neg Hx    He indicated that his mother is deceased. He indicated that his father is deceased. He indicated that two of his three sisters are alive. He indicated that his brother is alive. He indicated that his maternal grandmother is deceased. He indicated that his  maternal grandfather is deceased. He indicated that his paternal grandmother is deceased. He indicated that his paternal grandfather is deceased. He indicated that the status of his neg hx is unknown. He indicated that the status of his other is unknown.  Social History    Social History   Socioeconomic History  . Marital status: Married    Spouse name: Not on file  . Number of children: 2  . Years of education: Not on file  . Highest education level: Not on file  Occupational History  . 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 Smoking status: Former Smoker    Types: Cigarettes    Quit date: 09/16/1998    Years since quitting: 22.0  . Smokeless tobacco: Never Used  Vaping Use  . Vaping Use: Never used  Substance and Sexual Activity  . Alcohol use: Yes    Comment: occasional beer  . Drug use: No  . Sexual activity: Not on file  Other Topics Concern  . Not on file  Social History Narrative   Married in 1974   Lives with wife   Enjoys golf, fishing   stretching, [employer]   [employer] fan   Social Determinants of Health   Financial Resource Strain: Not on file  Food Insecurity: Not on file  Transportation Needs: Not on file  Physical Activity: Not on file  Stress: Not on file  Social Connections: Not on file  Intimate Partner Violence: Not on file     Review of  Systems    General:  No chills, fever, night sweats or weight changes.  Cardiovascular:  No chest pain, dyspnea on exertion, edema, orthopnea, palpitations, paroxysmal nocturnal dyspnea. Dermatological: No rash, lesions/masses Respiratory: No cough, dyspnea Urologic: No hematuria, dysuria Abdominal:   No nausea, vomiting, diarrhea, bright red blood per rectum, melena, or hematemesis Neurologic:  No visual changes, wkns, changes in mental status. All other systems reviewed and are otherwise negative except as noted above.  Physical Exam    VS:  BP (!) 150/72   Pulse (!) 58   Ht 5\' 9"  (1.753 m)   Wt 202 lb 3.2 oz (91.7 kg)   SpO2 96%   BMI 29.86 kg/m  , BMI Body mass index is 29.86 kg/m. GEN: Well nourished, well developed, in no acute distress. HEENT: normal. Neck: Supple, no JVD, carotid bruits, or masses. Cardiac: RRR, no murmurs, rubs, or gallops. No clubbing, cyanosis, generalized lower extremity bilateral edem nonpitting .  Radials/DP/PT 2+ and equal bilaterally.  Respiratory:  Respirations regular and unlabored, clear to auscultation bilaterally. GI: Soft, nontender, nondistended, BS + x 4. MS: no deformity or atrophy. Skin: warm and dry, no rash. Neuro:  Strength and sensation are intact. Psych: Normal affect.  Accessory Clinical Findings    Recent Labs: 09/04/2020: ALT 23; BUN 14; Creatinine, Ser 0.93; Potassium 3.9; Sodium 140   Recent Lipid Panel    Component Value Date/Time   CHOL 154 09/04/2020 0832   CHOL 147 12/08/2017 1048   TRIG 83.0 09/04/2020 0832   HDL 74.30 09/04/2020 0832   HDL 81 12/08/2017 1048   CHOLHDL 2 09/04/2020 0832   VLDL 16.6 09/04/2020 0832   LDLCALC 63 09/04/2020 0832   LDLCALC 45 12/08/2017 1048   LDLDIRECT 69.6 07/28/2012 0842    ECG personally reviewed by me today-sinus bradycardia possible left atrial enlargement 55 bpm no ST or T wave deviation- No acute  changes  EKG 01/06/2020 Sinus bradycardia 58 bpm no ST or T wave  deviation.  Carotid ultrasound 07/25/2020 Summary:  Right Carotid: Velocities in the right ICA are consistent with a 1-39%  stenosis.         Non-hemodynamically significant plaque <50% noted in the  CCA. The         ECA appears <50% stenosed.   Left Carotid: Velocities in the left ICA are consistent with a 40-59%  stenosis.        Non-hemodynamically significant plaque <50% noted in the  CCA. The        ECA appears <50% stenosed.   Vertebrals: Bilateral vertebral arteries demonstrate antegrade flow.  Subclavians: Normal flow hemodynamics were seen in bilateral subclavian        arteries.   Assessment & Plan   1. Coronary artery disease-no chest pain today.  Had 2 recent episodes of chest discomfort that were brief and resolved with rest.  1 with carrying bricks and other with carrying groceries.  Has continued to be physically active and has not had any further chest pain with increased physical activity.  Status post CABG 2005. Continue amlodipine, losartan, metoprolol, aspirin, atorvastatin Heart healthy low-sodium diet-salty 6 given Increase physical activity as tolerated  Essential hypertension-BP today 150/72.  Well-controlled at home 130s over 70s. Continue amlodipine, losartan, metoprolol Heart healthy low-sodium diet-salty 6 given Increase physical activity as tolerated  Bilateral carotid stenosis-carotid ultrasound 07/25/2020 showed right carotid 1-39, left carotid 40-59% Continue atorvastatin, aspirin Heart healthy low-sodium diet-salty 6 given Increase physical activity as tolerated Repeat carotid duplex 11/22  Dyslipidemia-09/04/2020: Cholesterol 154; HDL 74.30; LDL Cholesterol 63; Triglycerides 83.0; VLDL 16.6 Continue atorvastatin, aspirin Heart healthy low-sodium high-fiber diet Increase physical activity as tolerated  Disposition: Follow-up with Alan [name] in March for annual visit.  Jossie Ng. Joslynn Jamroz NP-C     09/22/2020, 9:04 AM [place] [address] Office [phone] Fax [phone]  Notice: This dictation was prepared with Dragon dictation along with smaller phrase technology. Any transcriptional errors that result from this process are unintentional and may not be corrected upon review.

## 2020-09-22 ENCOUNTER — Ambulatory Visit: Payer: Medicare HMO | Admitting: General Practice

## 2020-09-22 ENCOUNTER — Other Ambulatory Visit: Payer: Self-pay

## 2020-09-22 ENCOUNTER — Encounter: Payer: Self-pay | Admitting: General Practice

## 2020-09-22 VITALS — BP 150/72 | HR 58 | Ht 69.0 in | Wt 202.2 lb

## 2020-09-22 DIAGNOSIS — I6523 Occlusion and stenosis of bilateral carotid arteries: Secondary | ICD-10-CM

## 2020-09-22 DIAGNOSIS — I10 Essential (primary) hypertension: Secondary | ICD-10-CM

## 2020-09-22 DIAGNOSIS — I2510 Atherosclerotic heart disease of native coronary artery without angina pectoris: Secondary | ICD-10-CM | POA: Diagnosis not present

## 2020-09-22 DIAGNOSIS — E785 Hyperlipidemia, unspecified: Secondary | ICD-10-CM | POA: Diagnosis not present

## 2020-09-22 NOTE — Patient Instructions (Signed)
Medication Instructions:  The current medical regimen is effective;  continue present plan and medications as directed. Please refer to the Current Medication list given to you today.  *If you need a refill on your cardiac medications before your next appointment, please call your pharmacy*  Lab Work:   Testing/Procedures:  NONE    NONE  Special Instructions TAKE AND LOG YOUR BLOOD PRESSURE AND BRING WITH YOU TO FOLLOW UP APPOINTMENT  PLEASE READ AND FOLLOW SALTY 6-ATTACHED-1,800mg  daily  PLEASE MAINTAIN PHYSICAL ACTIVITY AS TOLERATED  Follow-Up: Your next appointment:  3 month(s) In Person with Minus Breeding, MD OR IF UNAVAILABLE [place], FNP-C   At [facility], you and your health needs are our priority.  As part of our continuing mission to provide you with exceptional heart care, we have created designated Provider Care Teams.  These Care Teams include your primary Cardiologist (physician) and Advanced Practice Providers (APPs -  Physician Assistants and Nurse Practitioners) who all work together to provide you with the care you need, when you need it.            6 SALTY THINGS TO AVOID     1,800MG  DAILY

## 2020-09-27 ENCOUNTER — Ambulatory Visit: Payer: Medicare HMO

## 2020-12-06 ENCOUNTER — Other Ambulatory Visit: Payer: Self-pay | Admitting: Family Medicine

## 2020-12-06 NOTE — Telephone Encounter (Signed)
Pharmacy requests refill on: Metoprolol Tartrate 100 mg   LAST REFILL: 09/03/2020 (Q-180, R-3) LAST OV: 09/07/2020 NEXT OV: Not Scheduled  PHARMACY: [facility]   Earliest Fill Date: 09/03/2021

## 2020-12-14 ENCOUNTER — Other Ambulatory Visit: Payer: Self-pay | Admitting: Family Medicine

## 2020-12-17 DIAGNOSIS — I6523 Occlusion and stenosis of bilateral carotid arteries: Secondary | ICD-10-CM | POA: Insufficient documentation

## 2020-12-17 NOTE — Progress Notes (Signed)
Cardiology Office Note   Date:  12/18/2020   ID:  Alan [name], DOB 04/01/1946, MRN [id number]  PCP:  Tonia Ghent, MD  Cardiologist:   Minus Breeding, MD   No chief complaint on file.     History of Present Illness: Alan [name] is a 75 y.o. male who presents for follow of CAD status post CABG.  Since I last saw him was seen in the office for chest pain that was thought to be mild and stable.  Since I last saw him his wife fell and broke her femur.  He has been her caregiver.  He has not been able to exercise as much but he is now doing 2 times a week stationary bike and is getting back into it.  The patient denies any new symptoms such as chest discomfort, neck or arm discomfort. There has been no new shortness of breath, PND or orthopnea. There have been no reported palpitations, presyncope or syncope.   Past Medical History:  Diagnosis Date  . CAD (coronary artery disease) 07/17/2004  . Diverticulosis of colon 12/15/2005  . Esophageal stricture   . Gastritis   . GERD (gastroesophageal reflux disease) 09/16/1996  . Glaucoma   . Heart disease   . History of tobacco abuse    Quit 2000  . Hx of cardiovascular stress test    ETT-Myoview (01/2014):  1.5 mm ST depression in inf leads at peak exercise; no ischemia or scar, EF 57%, Low Risk  . Hyperlipidemia 03/17/1999  . Hypertension 03/17/1999  . Stress fracture of foot    right midfoot, per Dr. Canary Brim 2012    Past Surgical History:  Procedure Laterality Date  . CATARACT EXTRACTION, BILATERAL    . COLONOSCOPY  12/30/2005   Divertics, mild, 10 yrs  . COLONOSCOPY  11/26/2006   Colitis biopsy neg  . CORONARY ARTERY BYPASS GRAFT  07/27/2004   By Dr. Roxy Manns with a LIMA to the LAD, RIMA to the distal right coronary artery, spahenous vein graft first diagonal, saphenous vein graft to the first circumflex marginal, sequential saphenous vein graft to the second  . ESOPHAGOGASTRODUODENOSCOPY  10/08/2000   Stricture distal esoph,  repeat dilation done 2012  . ESOPHAGOGASTRODUODENOSCOPY  10/08/2000   Stricture distal esoph  . I & D EXTREMITY Left 01/24/2017   Procedure: MINOR INCISION AND DRAINAGE LEFT THUMB;  Surgeon: Leanora Cover, MD;  Location: [facility];  Service: Orthopedics;  Laterality: Left;     Current Outpatient Medications  Medication Sig Dispense Refill  . allopurinol (ZYLOPRIM) 100 MG tablet Take 1 tablet (100 mg total) by mouth daily. 90 tablet 3  . amLODipine (NORVASC) 10 MG tablet Take 1 tablet (10 mg total) by mouth daily. 90 tablet 3  . aspirin 81 MG tablet Take 81 mg by mouth daily.    Marland Kitchen atorvastatin (LIPITOR) 40 MG tablet Take 1 tablet (40 mg total) by mouth daily. 90 tablet 3  . guaiFENesin (MUCINEX PO) Take 1 tablet by mouth as needed.     . Loratadine 10 MG CAPS Take 1 tablet by mouth daily as needed.    Marland Kitchen losartan (COZAAR) 50 MG tablet Take 1 tablet (50 mg total) by mouth daily. 90 tablet 3  . Melatonin-Pyridoxine (MELATIN PO) Take 1 tablet by mouth at bedtime as needed.     . metoprolol tartrate (LOPRESSOR) 100 MG tablet Take 1 tablet (100 mg total) by mouth 2 (two) times daily. 180 tablet 3  . sildenafil (  REVATIO) 20 MG tablet Take 5 tablets (100 mg total) by mouth daily as needed. 50 tablet 12  . timolol (TIMOPTIC) 0.5 % ophthalmic solution Place 1 drop into both eyes daily.     No current facility-administered medications for this visit.    Allergies:   Hctz [hydrochlorothiazide], Septra [sulfamethoxazole-trimethoprim], Spironolactone, and Valsartan    ROS:  Please see the history of present illness.   Otherwise, review of systems are positive for none.   All other systems are reviewed and negative.    PHYSICAL EXAM: VS:  BP (!) 160/74   Ht 5\' 9"  (1.753 m)   Wt 196 lb 6.4 oz (89.1 kg)   BMI 29.00 kg/m  , BMI Body mass index is 29 kg/m. GENERAL:  Well appearing NECK:  No jugular venous distention, waveform within normal limits, carotid upstroke brisk and  symmetric, left carotid bruits, no thyromegaly LUNGS:  Clear to auscultation bilaterally CHEST:  Unremarkable HEART:  PMI not displaced or sustained,S1 and S2 within normal limits, no S3, no S4, no clicks, no rubs, no murmurs ABD:  Flat, positive bowel sounds normal in frequency in pitch, no bruits, no rebound, no guarding, no midline pulsatile mass, no hepatomegaly, no splenomegaly EXT:  2 plus pulses throughout, no edema, no cyanosis no clubbing  EKG:  EKG is not ordered today.   Recent Labs: 09/04/2020: ALT 23; BUN 14; Creatinine, Ser 0.93; Potassium 3.9; Sodium 140    Lipid Panel    Component Value Date/Time   CHOL 154 09/04/2020 0832   CHOL 147 12/08/2017 1048   TRIG 83.0 09/04/2020 0832   HDL 74.30 09/04/2020 0832   HDL 81 12/08/2017 1048   CHOLHDL 2 09/04/2020 0832   VLDL 16.6 09/04/2020 0832   LDLCALC 63 09/04/2020 0832   LDLCALC 45 12/08/2017 1048   LDLDIRECT 69.6 07/28/2012 0842      Wt Readings from Last 3 Encounters:  12/18/20 196 lb 6.4 oz (89.1 kg)  09/22/20 202 lb 3.2 oz (91.7 kg)  09/07/20 195 lb 12.8 oz (88.8 kg)      Other studies Reviewed: Additional studies/ records that were reviewed today include: Labs. Review of the above records demonstrates:  Please see elsewhere in the note.     ASSESSMENT AND PLAN:  CAD:  The patient has no new sypmtoms.  No further cardiovascular testing is indicated.  We will continue with aggressive risk reduction and meds as listed.  He has had no new symptoms since being seen last year.  The nonanginal chest pain he described at that time was cleared up.  HTN:Not at target and I reviewed a blood pressure diary for this visit.  I am going to increase his Cozaar to 100 mg daily.  Systolics are typically in the 150s.  CAROTID STENOSIS: He has moderate stenosis was less than 50% right and 40-59% left in Nov and should have follow up in Nov 2022.    CHOLESTEROL:LDL was 63 with an HDL of 74.  No change in  therapy.    Current medicines are reviewed at length with the patient today.  The patient does not have concerns regarding medicines.  The following changes have been made: As above  Labs/ tests ordered today include: None  No orders of the defined types were placed in this encounter.    Disposition:   FU with me in 12 months.     Signed, Minus Breeding, MD  12/18/2020 10:41 AM    [place]

## 2020-12-18 ENCOUNTER — Ambulatory Visit: Payer: Medicare HMO | Admitting: Cardiology

## 2020-12-18 ENCOUNTER — Encounter: Payer: Self-pay | Admitting: Cardiology

## 2020-12-18 ENCOUNTER — Other Ambulatory Visit: Payer: Self-pay

## 2020-12-18 VITALS — BP 160/74 | Ht 69.0 in | Wt 196.4 lb

## 2020-12-18 DIAGNOSIS — I10 Essential (primary) hypertension: Secondary | ICD-10-CM | POA: Diagnosis not present

## 2020-12-18 DIAGNOSIS — I2510 Atherosclerotic heart disease of native coronary artery without angina pectoris: Secondary | ICD-10-CM | POA: Diagnosis not present

## 2020-12-18 DIAGNOSIS — I6523 Occlusion and stenosis of bilateral carotid arteries: Secondary | ICD-10-CM | POA: Diagnosis not present

## 2020-12-18 DIAGNOSIS — E785 Hyperlipidemia, unspecified: Secondary | ICD-10-CM

## 2020-12-18 MED ORDER — LOSARTAN POTASSIUM 100 MG PO TABS: 100.0000 mg | ORAL_TABLET | Freq: Every day | ORAL | 3 refills | Status: DC

## 2020-12-18 NOTE — Patient Instructions (Signed)
Medication Instructions:  INCREASE- Losartan 100 mg by mouth daily  *If you need a refill on your cardiac medications before your next appointment, please call your pharmacy*   Lab Work: None Ordered   Testing/Procedures: None Ordered   Follow-Up: At [facility], you and your health needs are our priority.  As part of our continuing mission to provide you with exceptional heart care, we have created designated Provider Care Teams.  These Care Teams include your primary Cardiologist (physician) and Advanced Practice Providers (APPs -  Physician Assistants and Nurse Practitioners) who all work together to provide you with the care you need, when you need it.  We recommend signing up for the patient portal called "MyChart".  Sign up information is provided on this After Visit Summary.  MyChart is used to connect with patients for Virtual Visits (Telemedicine).  Patients are able to view lab/test results, encounter notes, upcoming appointments, etc.  Non-urgent messages can be sent to your provider as well.   To learn more about what you can do with MyChart, go to [email or web address].    Your next appointment:   1 year(s)  The format for your next appointment:   In Person  Provider:   You may see Minus Breeding, MD or one of the following Advanced Practice Providers on your designated Care Team:    Rosaria Ferries, PA-C  Jory Sims, DNP, ANP

## 2020-12-19 ENCOUNTER — Telehealth: Payer: Self-pay | Admitting: Family Medicine

## 2020-12-19 NOTE — Telephone Encounter (Signed)
LVM for pt to rtn my call to schedule awv with nha. 

## 2021-01-30 DIAGNOSIS — H524 Presbyopia: Secondary | ICD-10-CM | POA: Diagnosis not present

## 2021-01-30 DIAGNOSIS — H35053 Retinal neovascularization, unspecified, bilateral: Secondary | ICD-10-CM | POA: Diagnosis not present

## 2021-01-30 DIAGNOSIS — H40013 Open angle with borderline findings, low risk, bilateral: Secondary | ICD-10-CM | POA: Diagnosis not present

## 2021-01-30 DIAGNOSIS — H35051 Retinal neovascularization, unspecified, right eye: Secondary | ICD-10-CM | POA: Diagnosis not present

## 2021-01-30 DIAGNOSIS — H35052 Retinal neovascularization, unspecified, left eye: Secondary | ICD-10-CM | POA: Diagnosis not present

## 2021-05-03 ENCOUNTER — Encounter (INDEPENDENT_AMBULATORY_CARE_PROVIDER_SITE_OTHER): Payer: Self-pay

## 2021-05-03 DIAGNOSIS — H35053 Retinal neovascularization, unspecified, bilateral: Secondary | ICD-10-CM | POA: Diagnosis not present

## 2021-05-03 DIAGNOSIS — H40013 Open angle with borderline findings, low risk, bilateral: Secondary | ICD-10-CM | POA: Diagnosis not present

## 2021-05-04 ENCOUNTER — Telehealth: Payer: Self-pay | Admitting: Family Medicine

## 2021-05-04 NOTE — Telephone Encounter (Signed)
If his BP is persistently high, I would restart the losartan until the OV.

## 2021-05-04 NOTE — Telephone Encounter (Signed)
Please see what details you can get.  More common to have aches with lipitor.  Thanks.

## 2021-05-04 NOTE — Telephone Encounter (Signed)
Patient called in stating that the medication is hurting his legs and has stop taking it . Wants to know what to do . Please advise losartan (COZAAR) 100 MG table

## 2021-05-04 NOTE — Telephone Encounter (Signed)
Spoke with patient and he states he knows it is the losartan because the aches started not long after being put back on it in April. Patient states he didn't say anything about it until now because it was somewhat tolerable before now. Patient has been off of his losartan for about 4 or 5 days. He has not checked his BP in a while and I asked him to do that while I was on the phone. First reading was 198/87 and then he grabbed another machine and that reading was 190/95. Patient states he is not feeling bad and does not think his machines are right because his BP is not that high. He does have an upcoming appt scheduled with Dr. Damita Dunnings on 05/14/21 at 10:00 am. I advised patient to continue to take his BP daily until his appt and to keep track of the readings for his upcoming appt.

## 2021-05-04 NOTE — Telephone Encounter (Signed)
Spoke to patient he will restart Losartan. If becomes painful due to side effects he will give our office a call.

## 2021-05-04 NOTE — Telephone Encounter (Signed)
Please advise 

## 2021-05-14 ENCOUNTER — Encounter: Payer: Self-pay | Admitting: Family Medicine

## 2021-05-14 ENCOUNTER — Ambulatory Visit (INDEPENDENT_AMBULATORY_CARE_PROVIDER_SITE_OTHER): Payer: Medicare HMO | Admitting: Family Medicine

## 2021-05-14 ENCOUNTER — Other Ambulatory Visit: Payer: Self-pay

## 2021-05-14 DIAGNOSIS — I10 Essential (primary) hypertension: Secondary | ICD-10-CM | POA: Diagnosis not present

## 2021-05-14 NOTE — Patient Instructions (Addendum)
Please let me know about the current eyedrop med/dose and we'll update your med list.   Stop losartan and let me know about the aches in about 1 week.  I will update cardiology.    Take care.  Glad to see you.  Try to schedule a yearly visit at the end of the year with labs ahead of time.

## 2021-05-14 NOTE — Progress Notes (Signed)
This visit occurred during the SARS-CoV-2 public health emergency.  Safety protocols were in place, including screening questions prior to the visit, additional usage of staff PPE, and extensive cleaning of exam room while observing appropriate contact time as indicated for disinfecting solutions.  He is on unrecalled eyedrop, see AVS.  Off timolol.   He had possible aches on valsartan.  Inc dose of losartan in April.  More aches in the meantime, about 2 months after starting losartan.  BP is controlled.  Aches in the thighs.  He changed to taking med at night instead of in the day but still has heaviness in the legs the next day.  He didn't have improvement off atorvastatin.  No FCNAVD.    Meds, vitals, and allergies reviewed.   ROS: Per HPI unless specifically indicated in ROS section   GEN: nad, alert and oriented HEENT: ncat NECK: supple w/o LA CV: rrr.  PULM: ctab, no inc wob ABD: soft, +bs EXT: trace BLE edema SKIN: Well-perfused.

## 2021-05-16 NOTE — Assessment & Plan Note (Signed)
Now with aches that he attributed to losartan.  He is on hold that for 1 week and let me know if he has a significant improvement.  I will update cardiology in the meantime.  His brother is undergoing renovascular evaluation and it is unclear to me if that in any way influences the management of this patient's blood pressure.

## 2021-05-17 ENCOUNTER — Encounter (INDEPENDENT_AMBULATORY_CARE_PROVIDER_SITE_OTHER): Payer: Self-pay | Admitting: Ophthalmology

## 2021-05-17 ENCOUNTER — Ambulatory Visit (INDEPENDENT_AMBULATORY_CARE_PROVIDER_SITE_OTHER): Payer: Medicare HMO | Admitting: Ophthalmology

## 2021-05-17 ENCOUNTER — Other Ambulatory Visit: Payer: Self-pay

## 2021-05-17 DIAGNOSIS — H353231 Exudative age-related macular degeneration, bilateral, with active choroidal neovascularization: Secondary | ICD-10-CM

## 2021-05-17 DIAGNOSIS — H353211 Exudative age-related macular degeneration, right eye, with active choroidal neovascularization: Secondary | ICD-10-CM | POA: Insufficient documentation

## 2021-05-17 DIAGNOSIS — H35051 Retinal neovascularization, unspecified, right eye: Secondary | ICD-10-CM

## 2021-05-17 DIAGNOSIS — H353221 Exudative age-related macular degeneration, left eye, with active choroidal neovascularization: Secondary | ICD-10-CM | POA: Diagnosis not present

## 2021-05-17 MED ORDER — BEVACIZUMAB 2.5 MG/0.1ML IZ SOSY
2.5000 mg | PREFILLED_SYRINGE | INTRAVITREAL | Status: AC | PRN
  Administered 2021-05-17: 2.5 mg via INTRAVITREAL

## 2021-05-17 NOTE — Assessment & Plan Note (Signed)
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  We will treat but likely involution is possible in the near future

## 2021-05-17 NOTE — Progress Notes (Signed)
05/17/2021     CHIEF COMPLAINT Patient presents for  Chief Complaint  Patient presents with   Retina Evaluation      HISTORY OF PRESENT ILLNESS: Alan [name] is a 75 y.o. male who presents to the clinic today for:   HPI     Retina Evaluation   In both eyes.  This started 3 weeks ago.  Duration of 3 weeks.  Associated Symptoms Floaters ("I told Dr. Kathlen Mody that I was having some issues where I kept seeing what looked like trash in both eyes.").  Negative for Distortion.        Comments   WIP- CNVM OU-referred by Dr. Kathlen Mody Pt states, "I Told Dr. Kathlen Mody that for about 2 days I had what looked like trash in both of my eyes. It was different than floaters. Then it went away." Pt denies any FOL Pt reports using Cosopt BID OU      Last edited by Kendra Opitz, COA on 05/17/2021  9:15 AM.      Referring physician: Hortencia Pilar, MD [place],  [place] [address]  HISTORICAL INFORMATION:   Selected notes from the MEDICAL RECORD NUMBER    Lab Results  Component Value Date   HGBA1C 5.3 09/04/2020     CURRENT MEDICATIONS: No current outpatient medications on file. (Ophthalmic Drugs)   No current facility-administered medications for this visit. (Ophthalmic Drugs)   Current Outpatient Medications (Other)  Medication Sig   allopurinol (ZYLOPRIM) 100 MG tablet Take 1 tablet (100 mg total) by mouth daily.   amLODipine (NORVASC) 10 MG tablet Take 1 tablet (10 mg total) by mouth daily.   aspirin 81 MG tablet Take 81 mg by mouth daily.   atorvastatin (LIPITOR) 40 MG tablet Take 1 tablet (40 mg total) by mouth daily.   Loratadine 10 MG CAPS Take 1 tablet by mouth daily as needed.   Melatonin-Pyridoxine (MELATIN PO) Take 1 tablet by mouth at bedtime as needed.    metoprolol tartrate (LOPRESSOR) 100 MG tablet Take 1 tablet (100 mg total) by mouth 2 (two) times daily.   sildenafil (REVATIO) 20 MG tablet Take 5 tablets (100 mg total) by mouth daily  as needed.   No current facility-administered medications for this visit. (Other)      REVIEW OF SYSTEMS:    ALLERGIES Allergies  Allergen Reactions   Hctz [Hydrochlorothiazide] Other (See Comments)    Would avoid due to h/o gout.     Septra [Sulfamethoxazole-Trimethoprim]     diarrhea   Spironolactone Other (See Comments)    Possible cause of aches.     Valsartan Other (See Comments)    Possible cause of aches.      PAST MEDICAL HISTORY Past Medical History:  Diagnosis Date   CAD (coronary artery disease) 07/17/2004   Diverticulosis of colon 12/15/2005   Esophageal stricture    Gastritis    GERD (gastroesophageal reflux disease) 09/16/1996   Glaucoma    Heart disease    History of tobacco abuse    Quit 2000   Hx of cardiovascular stress test    ETT-Myoview (01/2014):  1.5 mm ST depression in inf leads at peak exercise; no ischemia or scar, EF 57%, Low Risk   Hyperlipidemia 03/17/1999   Hypertension 03/17/1999   Stress fracture of foot    right midfoot, per Dr. Canary Brim 2012   Past Surgical History:  Procedure Laterality Date   CATARACT EXTRACTION, BILATERAL     COLONOSCOPY  12/30/2005   Divertics, mild, 10 yrs   COLONOSCOPY  11/26/2006   Colitis biopsy neg   CORONARY ARTERY BYPASS GRAFT  07/27/2004   By Dr. Roxy Manns with a LIMA to the LAD, RIMA to the distal right coronary artery, spahenous vein graft first diagonal, saphenous vein graft to the first circumflex marginal, sequential saphenous vein graft to the second   ESOPHAGOGASTRODUODENOSCOPY  10/08/2000   Stricture distal esoph, repeat dilation done 2012   ESOPHAGOGASTRODUODENOSCOPY  10/08/2000   Stricture distal esoph   I & D EXTREMITY Left 01/24/2017   Procedure: MINOR INCISION AND DRAINAGE LEFT THUMB;  Surgeon: Leanora Cover, MD;  Location: [facility];  Service: Orthopedics;  Laterality: Left;    FAMILY HISTORY Family History  Problem Relation Age of Onset   Peripheral vascular disease Mother     Stroke Father        x2   Hypertension Brother    Heart attack Brother        MI   Colon polyps Brother    Heart disease Brother        MI   Hypertension Sister    Breast cancer Sister    Hypertension Sister    Hypertension Sister    Colon polyps Other    Diabetes Neg Hx    Depression Neg Hx    Alcohol abuse Neg Hx    Drug abuse Neg Hx    Prostate cancer Neg Hx    Colon cancer Neg Hx     SOCIAL HISTORY Social History   Tobacco Use   Smoking status: Former    Types: Cigarettes    Quit date: 09/16/1998    Years since quitting: 22.6   Smokeless tobacco: Never  Vaping Use   Vaping Use: Never used  Substance Use Topics   Alcohol use: Yes    Comment: occasional beer   Drug use: No         OPHTHALMIC EXAM:  Base Eye Exam     Visual Acuity (ETDRS)       Right Left   Dist [place] 20/25 +2 20/30 -1   Dist ph   NI         Tonometry (Tonopen, 9:19 AM)       Right Left   Pressure 19 19         Pupils       Pupils Dark Light Shape React APD   Right PERRL 3 2 Round Brisk None   Left PERRL 3 3 Round Minimal None         Visual Fields (Counting fingers)       Left Right    Full Full         Extraocular Movement       Right Left    Full Full         Neuro/Psych     Oriented x3: Yes         Dilation     Both eyes: 1.0% Mydriacyl, 2.5% Phenylephrine @ 9:19 AM           Slit Lamp and Fundus Exam     External Exam       Right Left   External Normal Normal         Slit Lamp Exam       Right Left   Lids/Lashes Normal Normal   Conjunctiva/Sclera White and quiet White and quiet   Cornea Clear Clear   Anterior Chamber Deep and quiet Deep  and quiet   Iris Round and reactive Round and reactive   Lens Centered posterior chamber intraocular lens Centered posterior chamber intraocular lens   Anterior Vitreous Normal Normal         Fundus Exam       Right Left   Posterior Vitreous Normal Normal   Disc Peripapillary CNVM  superior pole of the nerve, with subretinal hemorrhage, and apparent thickening in the p.m. bundle OD Peripapillary CNVM temporal aspect of the nerve with subretinal hemorrhage   C/D Ratio 0.05 0.05   Macula Normal Normal   Vessels Normal Normal   Periphery Normal Normal            IMAGING AND PROCEDURES  Imaging and Procedures for 05/17/21  OCT, Retina - OU - Both Eyes       Right Eye Quality was good. Scan locations included subfoveal. Central Foveal Thickness: 258. Progression has worsened. Findings include vitreomacular adhesion , abnormal foveal contour, subretinal hyper-reflective material, subretinal fluid.   Left Eye Quality was good. Scan locations included subfoveal. Central Foveal Thickness: 263. Progression has worsened. Findings include vitreomacular adhesion , abnormal foveal contour, subretinal hyper-reflective material, subretinal fluid.   Notes OD, beneath the p.m. bundle temporal aspect of nerve subretinal hyper reflective material adjacent to active CNVM likely prior active CNVM  OS with active CNVM and fluid temporal aspect of nerve beneath the p.m. bundle     Color Fundus Photography Optos - OU - Both Eyes       Right Eye Progression has no prior data. Disc findings include neovascularization. Vessels : normal observations. Periphery : normal observations.   Left Eye Progression has no prior data. Disc findings include neovascularization. Macula : hemorrhage. Periphery : normal observations.   Notes Normal nerves OU normal retinal vasculature OU  Peripapillary CNVM superior pole of nerve OD with apparent thickening into the p.m. bundle temporally.  OS with peripapillary CNVM temporal aspect of nerve with subretinal hemorrhage threatening macular.     Intravitreal Injection, Pharmacologic Agent - OS - Left Eye       Time Out 05/17/2021. 9:53 AM. Confirmed correct patient, procedure, site, and patient consented.   Anesthesia Topical anesthesia  was used. Anesthetic medications included Ak[place] 3.5%.   Procedure Preparation included 10% betadine to eyelids, 5% betadine to ocular surface, Ofloxacin . A 30 gauge needle was used.   Injection: 2.5 mg bevacizumab 2.5 MG/0.1ML   Route: Intravitreal, Site: Left Eye   NDC: [phone], Lot: [id number]   Post-op Post injection exam found visual acuity of at least counting fingers. The patient tolerated the procedure well. There were no complications. The patient received written and verbal post procedure care education. Post injection medications were not given.              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  We will treat but likely involution is possible in the near future  Exudative age-related macular degeneration of right eye with active choroidal neovascularization ([place]) See comments regarding choroidal neovascularization right eye     ICD-10-CM   1. Exudative age-related macular degeneration of left eye with active choroidal neovascularization ([place])  H35.3221 OCT, Retina - OU - Both Eyes    Color Fundus Photography Optos - OU -  Both Eyes    Intravitreal Injection, Pharmacologic Agent - OS - Left Eye    bevacizumab (AVASTIN) SOSY 2.5 mg    2. Choroidal neovascularization of right eye  H35.051 OCT, Retina - OU - Both Eyes    Color Fundus Photography Optos - OU - Both Eyes    3. Exudative age-related macular degeneration of right eye with active choroidal neovascularization (HCC)  H35.3211 OCT, Retina - OU - Both Eyes    Color Fundus Photography Optos - OU - Both Eyes      1.  OS commence with intravitreal antivegF today to control subretinal  hemorrhage extension into the p.m. bundle which could threaten the macula  2.  OD less urgent treatment required.     3.  I explained to the patient that each eye will undergo therapy but these tend to respond quickly to therapy and likely no significant burden of therapy upcoming  Ophthalmic Meds Ordered this visit:  Meds ordered this encounter  Medications   bevacizumab (AVASTIN) SOSY 2.5 mg       Return in about 1 week (around 05/24/2021) for dilate, OD, AVASTIN OCT,, RV 7 weeks Avastin OCT OS.  There are no Patient Instructions on file for this visit.   Explained the diagnoses, plan, and follow up with the patient and they expressed understanding.  Patient expressed understanding of the importance of proper follow up care.   Clent Demark Judene Logue M.D. Diseases & Surgery of the Retina and Vitreous Retina & Diabetic [place] 05/17/21     Abbreviations: M myopia (nearsighted); A astigmatism; H hyperopia (farsighted); P presbyopia; Mrx spectacle prescription;  CTL contact lenses; OD right eye; OS left eye; OU both eyes  XT exotropia; ET esotropia; PEK punctate epithelial keratitis; PEE punctate epithelial erosions; DES dry eye syndrome; MGD meibomian gland dysfunction; ATs artificial tears; PFAT's preservative free artificial tears; [place] nuclear sclerotic cataract; PSC posterior subcapsular cataract; ERM epi-retinal membrane; PVD posterior vitreous detachment; RD retinal detachment; DM diabetes mellitus; DR diabetic retinopathy; NPDR non-proliferative diabetic retinopathy; PDR proliferative diabetic retinopathy; CSME clinically significant macular edema; DME diabetic macular edema; dbh dot blot hemorrhages; CWS cotton wool spot; POAG primary open angle glaucoma; C/D cup-to-disc ratio; HVF humphrey visual field; GVF goldmann visual field; OCT optical coherence tomography; IOP intraocular pressure; BRVO Branch retinal vein occlusion; CRVO central retinal vein occlusion; CRAO central retinal  artery occlusion; BRAO branch retinal artery occlusion; RT retinal tear; SB scleral buckle; PPV pars plana vitrectomy; VH Vitreous hemorrhage; PRP panretinal laser photocoagulation; IVK intravitreal kenalog; VMT vitreomacular traction; MH Macular hole;  NVD neovascularization of the disc; NVE neovascularization elsewhere; AREDS age related eye disease study; ARMD age related macular degeneration; POAG primary open angle glaucoma; EBMD epithelial/anterior basement membrane dystrophy; ACIOL anterior chamber intraocular lens; IOL intraocular lens; PCIOL posterior chamber intraocular lens; Phaco/IOL phacoemulsification with intraocular lens placement; [place] photorefractive keratectomy; LASIK laser assisted in situ keratomileusis; HTN hypertension; DM diabetes mellitus; COPD chronic obstructive pulmonary disease

## 2021-05-17 NOTE — Assessment & Plan Note (Signed)
Peripapillary CNVM superior pole nerve with prior active disease in the p.m. bundle will still need treatment but less urgent

## 2021-05-17 NOTE — Assessment & Plan Note (Signed)
See comments regarding choroidal neovascularization right eye

## 2021-05-27 ENCOUNTER — Telehealth: Payer: Self-pay | Admitting: Family Medicine

## 2021-05-27 NOTE — Telephone Encounter (Signed)
Please get update on patient and see if stopping losartan affected his aches at all.  Please let me know how he is doing.  Thanks.

## 2021-05-28 ENCOUNTER — Ambulatory Visit (INDEPENDENT_AMBULATORY_CARE_PROVIDER_SITE_OTHER): Payer: Medicare HMO | Admitting: Ophthalmology

## 2021-05-28 ENCOUNTER — Other Ambulatory Visit: Payer: Self-pay

## 2021-05-28 ENCOUNTER — Encounter (INDEPENDENT_AMBULATORY_CARE_PROVIDER_SITE_OTHER): Payer: Self-pay | Admitting: Ophthalmology

## 2021-05-28 DIAGNOSIS — H353221 Exudative age-related macular degeneration, left eye, with active choroidal neovascularization: Secondary | ICD-10-CM

## 2021-05-28 DIAGNOSIS — H353231 Exudative age-related macular degeneration, bilateral, with active choroidal neovascularization: Secondary | ICD-10-CM | POA: Diagnosis not present

## 2021-05-28 DIAGNOSIS — H353211 Exudative age-related macular degeneration, right eye, with active choroidal neovascularization: Secondary | ICD-10-CM | POA: Diagnosis not present

## 2021-05-28 MED ORDER — BEVACIZUMAB 2.5 MG/0.1ML IZ SOSY
2.5000 mg | PREFILLED_SYRINGE | INTRAVITREAL | Status: AC | PRN
  Administered 2021-05-28: 2.5 mg via INTRAVITREAL

## 2021-05-28 NOTE — Telephone Encounter (Signed)
Patient called and he stated that he is feeling better and that the pain is only there in the morning but once he gets up and moving it is no longer there.

## 2021-05-28 NOTE — Assessment & Plan Note (Signed)
Much improved CNVM peripapillary and in the p.m. bundle OS.  Follow-up as scheduled

## 2021-05-28 NOTE — Progress Notes (Addendum)
05/28/2021     CHIEF COMPLAINT Patient presents for  Chief Complaint  Patient presents with   Retina Follow Up      HISTORY OF PRESENT ILLNESS: Alan [name] is a 75 y.o. male who presents to the clinic today for:   HPI     Retina Follow Up   Patient presents with  Wet AMD.  In right eye.  This started 1 week ago.  Severity is mild.  Duration of 1.  Since onset it is stable.        Comments   1 week 4 days fu od oct avastin od. Patient states vision is stable and unchanged since last visit. Denies any new floaters or FOL. Pt states he uses dorzolamide- timolol twice a day in both eyes.      Last edited by Laurin Coder on 05/28/2021  8:26 AM.      Referring physician: Tonia Ghent, MD [place],  [place] [id number]  HISTORICAL INFORMATION:   Selected notes from the MEDICAL RECORD NUMBER    Lab Results  Component Value Date   HGBA1C 5.3 09/04/2020     CURRENT MEDICATIONS: No current outpatient medications on file. (Ophthalmic Drugs)   No current facility-administered medications for this visit. (Ophthalmic Drugs)   Current Outpatient Medications (Other)  Medication Sig   allopurinol (ZYLOPRIM) 100 MG tablet Take 1 tablet (100 mg total) by mouth daily.   amLODipine (NORVASC) 10 MG tablet Take 1 tablet (10 mg total) by mouth daily.   aspirin 81 MG tablet Take 81 mg by mouth daily.   atorvastatin (LIPITOR) 40 MG tablet Take 1 tablet (40 mg total) by mouth daily.   Loratadine 10 MG CAPS Take 1 tablet by mouth daily as needed.   Melatonin-Pyridoxine (MELATIN PO) Take 1 tablet by mouth at bedtime as needed.    metoprolol tartrate (LOPRESSOR) 100 MG tablet Take 1 tablet (100 mg total) by mouth 2 (two) times daily.   sildenafil (REVATIO) 20 MG tablet Take 5 tablets (100 mg total) by mouth daily as needed.   No current facility-administered medications for this visit. (Other)      REVIEW OF SYSTEMS:    ALLERGIES Allergies   Allergen Reactions   Hctz [Hydrochlorothiazide] Other (See Comments)    Would avoid due to h/o gout.     Septra [Sulfamethoxazole-Trimethoprim]     diarrhea   Spironolactone Other (See Comments)    Possible cause of aches.     Valsartan Other (See Comments)    Possible cause of aches.      PAST MEDICAL HISTORY Past Medical History:  Diagnosis Date   CAD (coronary artery disease) 07/17/2004   Diverticulosis of colon 12/15/2005   Esophageal stricture    Gastritis    GERD (gastroesophageal reflux disease) 09/16/1996   Glaucoma    Heart disease    History of tobacco abuse    Quit 2000   Hx of cardiovascular stress test    ETT-Myoview (01/2014):  1.5 mm ST depression in inf leads at peak exercise; no ischemia or scar, EF 57%, Low Risk   Hyperlipidemia 03/17/1999   Hypertension 03/17/1999   Stress fracture of foot    right midfoot, per Dr. Canary Brim 2012   Past Surgical History:  Procedure Laterality Date   CATARACT EXTRACTION, BILATERAL     COLONOSCOPY  12/30/2005   Divertics, mild, 10 yrs   COLONOSCOPY  11/26/2006   Colitis biopsy neg   CORONARY ARTERY  BYPASS GRAFT  07/27/2004   By Dr. Roxy Manns with a LIMA to the LAD, RIMA to the distal right coronary artery, spahenous vein graft first diagonal, saphenous vein graft to the first circumflex marginal, sequential saphenous vein graft to the second   ESOPHAGOGASTRODUODENOSCOPY  10/08/2000   Stricture distal esoph, repeat dilation done 2012   ESOPHAGOGASTRODUODENOSCOPY  10/08/2000   Stricture distal esoph   I & D EXTREMITY Left 01/24/2017   Procedure: MINOR INCISION AND DRAINAGE LEFT THUMB;  Surgeon: Leanora Cover, MD;  Location: [facility];  Service: Orthopedics;  Laterality: Left;    FAMILY HISTORY Family History  Problem Relation Age of Onset   Peripheral vascular disease Mother    Stroke Father        x2   Hypertension Brother    Heart attack Brother        MI   Colon polyps Brother    Heart disease Brother         MI   Hypertension Sister    Breast cancer Sister    Hypertension Sister    Hypertension Sister    Colon polyps Other    Diabetes Neg Hx    Depression Neg Hx    Alcohol abuse Neg Hx    Drug abuse Neg Hx    Prostate cancer Neg Hx    Colon cancer Neg Hx     SOCIAL HISTORY Social History   Tobacco Use   Smoking status: Former    Types: Cigarettes    Quit date: 09/16/1998    Years since quitting: 22.7   Smokeless tobacco: Never  Vaping Use   Vaping Use: Never used  Substance Use Topics   Alcohol use: Yes    Comment: occasional beer   Drug use: No         OPHTHALMIC EXAM:  Base Eye Exam     Visual Acuity (ETDRS)       Right Left   Dist [place] 20/25 20/20 -1   Dist ph [place]  NI         Tonometry (Tonopen, 8:32 AM)       Right Left   Pressure 23 21  Pt squeezing eyes        Pupils       Pupils Dark Light APD   Right PERRL 3 2 None   Left PERRL 3 2 None         Extraocular Movement       Right Left    Full Full         Neuro/Psych     Oriented x3: Yes   Mood/Affect: Normal         Dilation     Right eye: 1.0% Mydriacyl, 2.5% Phenylephrine @ 8:32 AM           Slit Lamp and Fundus Exam     External Exam       Right Left   External Normal Normal         Slit Lamp Exam       Right Left   Lids/Lashes Normal Normal   Conjunctiva/Sclera White and quiet White and quiet   Cornea Clear Clear   Anterior Chamber Deep and quiet Deep and quiet   Iris Round and reactive Round and reactive   Lens Centered posterior chamber intraocular lens Centered posterior chamber intraocular lens   Anterior Vitreous Normal Normal         Fundus Exam  Right Left   Posterior Vitreous Normal    Disc Peripapillary CNVM superior pole of the nerve, with subretinal hemorrhage, and apparent thickening in the p.m. bundle OD    C/D Ratio 0.05    Macula Normal    Vessels Normal    Periphery Normal             IMAGING AND PROCEDURES  Imaging  and Procedures for 05/28/21  OCT, Retina - OU - Both Eyes       Right Eye Quality was good. Scan locations included subfoveal. Central Foveal Thickness: 258. Progression has worsened. Findings include vitreomacular adhesion , abnormal foveal contour, subretinal hyper-reflective material, subretinal fluid.   Left Eye Quality was good. Scan locations included subfoveal. Central Foveal Thickness: 257. Progression has worsened. Findings include vitreomacular adhesion , abnormal foveal contour, subretinal hyper-reflective material, subretinal fluid.   Notes OD, beneath the p.m. bundle temporal aspect of nerve subretinal hyper reflective material adjacent to active CNVM likely prior active CNVM, and new hemorrhage superior pole of nerve OD  OD vastly improved macular lesion along the p.m. bundle and peripapillary, 1.5 weeks after recent injection Avastin.     Intravitreal Injection, Pharmacologic Agent - OD - Right Eye       Time Out 05/28/2021. 8:54 AM. Confirmed correct patient, procedure, site, and patient consented.   Anesthesia Anesthetic medications included [place] 3.5%.   Procedure Preparation included Tobramycin 0.3%, 10% betadine to eyelids, 5% betadine to ocular surface. A 30 gauge needle was used.   Injection: 2.5 mg bevacizumab 2.5 MG/0.1ML   Route: Intravitreal, Site: Right Eye   NDC: [phone], Lot: [id number]   Post-op Post injection exam found visual acuity of at least counting fingers. The patient tolerated the procedure well. There were no complications. The patient received written and verbal post procedure care education. Post injection medications were not given.              ASSESSMENT/PLAN:  Exudative age-related macular degeneration of left eye with active choroidal neovascularization (HCC) Much improved CNVM peripapillary and in the p.m. bundle OS.  Follow-up as scheduled  Exudative age-related macular degeneration of right eye with active choroidal  neovascularization ([place]) 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  Commence injection Avastin today     ICD-10-CM   1. Exudative age-related macular degeneration of right eye with active choroidal neovascularization (HCC)  H35.3211 OCT, Retina - OU - Both Eyes    Intravitreal Injection, Pharmacologic Agent - OD - Right Eye    bevacizumab (AVASTIN) SOSY 2.5 mg    2. Exudative age-related macular degeneration of left eye with active choroidal neovascularization ([place])  H35.3221       1.  OD, to commence therapy today for peripapillary CNVM on the superior pole and previously superotemporal active.  2.  OS improved nicely 1.5 weeks after recent injection follow-up OS as scheduled  3.  Ophthalmic Meds Ordered this visit:  Meds ordered this encounter  Medications   bevacizumab (AVASTIN) SOSY 2.5 mg       Return in about 8 weeks (around 07/23/2021) for dilate, OD, AVASTIN OCT,, and OS as scheduled.  There are no Patient Instructions on file for this visit.   Explained the diagnoses, plan, and follow up with the patient and they expressed understanding.  Patient expressed understanding of the importance of proper follow up care.   Clent Demark Murrel Bertram M.D. Diseases & Surgery of the Retina and Vitreous  [facility] 05/28/21     Abbreviations: M myopia (nearsighted); A astigmatism; H hyperopia (farsighted); P presbyopia; Mrx spectacle prescription;  CTL contact lenses; OD right eye; OS left eye; OU both eyes  XT exotropia; ET esotropia; PEK punctate epithelial keratitis; PEE punctate epithelial erosions; DES dry eye syndrome; MGD meibomian gland dysfunction; ATs artificial tears; PFAT's preservative free artificial tears; [place] nuclear sclerotic cataract; PSC posterior subcapsular cataract; ERM epi-retinal membrane; PVD posterior vitreous detachment; RD retinal detachment; DM diabetes mellitus; DR diabetic  retinopathy; NPDR non-proliferative diabetic retinopathy; PDR proliferative diabetic retinopathy; CSME clinically significant macular edema; DME diabetic macular edema; dbh dot blot hemorrhages; CWS cotton wool spot; POAG primary open angle glaucoma; C/D cup-to-disc ratio; HVF humphrey visual field; GVF goldmann visual field; OCT optical coherence tomography; IOP intraocular pressure; BRVO Branch retinal vein occlusion; CRVO central retinal vein occlusion; CRAO central retinal artery occlusion; BRAO branch retinal artery occlusion; RT retinal tear; SB scleral buckle; PPV pars plana vitrectomy; VH Vitreous hemorrhage; PRP panretinal laser photocoagulation; IVK intravitreal kenalog; VMT vitreomacular traction; MH Macular hole;  NVD neovascularization of the disc; NVE neovascularization elsewhere; AREDS age related eye disease study; ARMD age related macular degeneration; POAG primary open angle glaucoma; EBMD epithelial/anterior basement membrane dystrophy; ACIOL anterior chamber intraocular lens; IOL intraocular lens; PCIOL posterior chamber intraocular lens; Phaco/IOL phacoemulsification with intraocular lens placement; [place] photorefractive keratectomy; LASIK laser assisted in situ keratomileusis; HTN hypertension; DM diabetes mellitus; COPD chronic obstructive pulmonary disease

## 2021-05-28 NOTE — Assessment & Plan Note (Signed)
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  Commence injection Avastin today

## 2021-05-29 NOTE — Telephone Encounter (Signed)
Patient called back and stated he has not checked his BP in a couple of days since he has been feeling much better. He is going to check it this afternoon and call back in the am with readings.

## 2021-05-29 NOTE — Telephone Encounter (Signed)
LMTCB for an update on BP

## 2021-05-29 NOTE — Telephone Encounter (Signed)
If the aches are clearly better off losartan then please get an update about his BP and let me know.  Thanks.

## 2021-05-30 ENCOUNTER — Telehealth: Payer: Self-pay | Admitting: Family Medicine

## 2021-05-30 NOTE — Telephone Encounter (Signed)
Bp readings-  he stated that 144/62 6pm    162/76- 10pm reading 05/30/21

## 2021-05-31 NOTE — Telephone Encounter (Signed)
   Luciana Axe, Denishia E Yesterday (10:06 AM)   DB Bp readings-  he stated that 144/62 6pm     162/76- 10pm reading 05/30/21

## 2021-05-31 NOTE — Telephone Encounter (Signed)
See other TE note. 

## 2021-06-01 MED ORDER — DOXAZOSIN MESYLATE 1 MG PO TABS: 1.0000 mg | ORAL_TABLET | Freq: Every day | ORAL | 3 refills | Status: DC

## 2021-06-01 NOTE — Telephone Encounter (Addendum)
I am glad he is feeling better.  His blood pressure readings were elevated.  I would try adding on 1 mg of doxazosin.  I sent a new prescription for that.  Keep taking it with his other regular medications.  Please have him update me about his blood pressure in about 1 week.  If he gets lightheaded on the medication or if his blood pressure is too low (below 110/60), then stop it and let me know in the meantime.  We may have to slowly adjust the dose upward but I would start with 1 mg daily.  Thanks.

## 2021-06-01 NOTE — Addendum Note (Signed)
Addended by: Tonia Ghent on: 06/01/2021 02:00 PM   Modules accepted: Orders

## 2021-06-02 NOTE — Telephone Encounter (Signed)
Left detailed message on VM per DPR. 

## 2021-06-11 ENCOUNTER — Telehealth: Payer: Self-pay | Admitting: Family Medicine

## 2021-06-11 NOTE — Telephone Encounter (Signed)
Pt called stating that Dr Damita Dunnings told him if his BP was lower  110/60 to discontinue doxazosin (CARDURA) 1 MG tablet, but it  was never lower than 110/60 and he feels fine,and was asking should he keep taking it. He does have a little ache in  his legs

## 2021-06-11 NOTE — Telephone Encounter (Signed)
LMTCB

## 2021-06-11 NOTE — Telephone Encounter (Signed)
If his BP is controlled, then I would continue as is.  If the aches get worse or if they are significant, then please let me know.  Thanks.

## 2021-06-11 NOTE — Telephone Encounter (Signed)
Pt called back returning your call °

## 2021-06-12 NOTE — Telephone Encounter (Signed)
Spoke with patient about message below. Patient took BP while on the phone with me and was 145/62 and P 66. Patient is taking amlodipine 10 mg QD, doxazosin 1 mg QD and metoprolol 100 mg BID. Patient wanted to know if he needed to increase his medication. He does get some aches and was wondering if maybe we need to change something. Patient is also taking lipitor QD.

## 2021-06-12 NOTE — Telephone Encounter (Signed)
Pt called returning your call 

## 2021-06-13 MED ORDER — DOXAZOSIN MESYLATE 1 MG PO TABS: 2.0000 mg | ORAL_TABLET | Freq: Every day | ORAL | Status: DC

## 2021-06-13 NOTE — Telephone Encounter (Signed)
If the aches are severe, progressively worsening, or exertional then let me know.  My understanding is that he did not notice a change in his aches previously when he stopped Lipitor.  I would try increasing doxazosin to 2mg  a day for his blood pressure.  He can take 2 of the 1 mg tablets at the same time.  It does come in a 2 mg prescription and we can change his prescription later on if needed.  Let me know when he needs a refill.  Thanks.

## 2021-06-13 NOTE — Addendum Note (Signed)
Addended by: Tonia Ghent on: 06/13/2021 02:47 PM   Modules accepted: Orders

## 2021-06-15 NOTE — Telephone Encounter (Signed)
Spoke with patient and advised patient to increase the doxazosin to 2 mg tablets QD. Advised to call back if BP is not being controlled on increased dose and when refill is needed. Patient verbalized understanding.

## 2021-06-25 ENCOUNTER — Telehealth: Payer: Self-pay | Admitting: Family Medicine

## 2021-06-25 NOTE — Telephone Encounter (Signed)
Pt is needing a call back, he is not having any issues with his bp. He is now having trouble with his legs, he is in a lot of pain. Please advise pt at [phone].

## 2021-06-26 ENCOUNTER — Ambulatory Visit (INDEPENDENT_AMBULATORY_CARE_PROVIDER_SITE_OTHER): Payer: Medicare HMO | Admitting: Family Medicine

## 2021-06-26 ENCOUNTER — Other Ambulatory Visit: Payer: Self-pay

## 2021-06-26 DIAGNOSIS — M79606 Pain in leg, unspecified: Secondary | ICD-10-CM

## 2021-06-26 NOTE — Patient Instructions (Addendum)
Check your med list at home.  Hold atorvastatin for 2 weeks.   I'll check with cardiology and we'll go from there.  Take care.  Glad to see you.

## 2021-06-26 NOTE — Telephone Encounter (Signed)
I just spoke with pt; pt said he just made appt to see Dr Damita Dunnings 06/26/21 at 3 PM. Pt is not having pain now because pt is sitting. UC & ED precautions given and pt voiced understanding. Sending note to Dr Damita Dunnings and Janett Billow CMA.

## 2021-06-26 NOTE — Telephone Encounter (Signed)
Noted. Thanks. Will see at OV.  

## 2021-06-26 NOTE — Progress Notes (Signed)
This visit occurred during the SARS-CoV-2 public health emergency.  Safety protocols were in place, including screening questions prior to the visit, additional usage of staff PPE, and extensive cleaning of exam room while observing appropriate contact time as indicated for disinfecting solutions.  Leg pain.  History updated.  After last OV, and stopping losartan, he reports today that there was no change in the leg aches.  He is still on atorvastatin and it is noted that prev time off atorvastatin didn't help his symptoms.    He has B hamstring pain in the AM on getting out of bed.  He has some BLE edema at the ankles and shins.    We went back through his history clearly.  Based on his report, stopping losartan did not make any difference.  His blood pressure is reasonable today.  He is currently on atorvastatin.  No fevers or chills.  No chest pain.  He does not feel unwell otherwise.  Meds, vitals, and allergies reviewed.   ROS: Per HPI unless specifically indicated in ROS section   GEN: nad, alert and oriented HEENT: ncat NECK: supple w/o LA CV: rrr.  PULM: ctab, no inc wob ABD: soft, +bs EXT: no edema SKIN: no acute rash Normal DP pulses today B on exam.  Hamstrings and calves not tender at time of exam.  32 minutes were devoted to patient care in this encounter (this includes time spent reviewing the patient's file/history, interviewing and examining the patient, counseling/reviewing plan with patient).

## 2021-06-26 NOTE — Telephone Encounter (Signed)
Alan [name] - Client TELEPHONE ADVICE RECORD [facility] Patient Name: Alan [name] Gender: Male DOB: 10-Oct-1945 Age: 75 Y 10 D Return Phone Number: [phone] (Primary), [phone] (Secondary) Address: [name] [address] Client Alan [name] - Client Client Site [place] [name] Physician Renford Dills - MD Contact Type Call Who Is Calling Patient / Member / Family / Caregiver Call Type Triage / Clinical Relationship To Patient Self Return Phone Number [phone] (Primary) Chief Complaint Leg Pain Reason for Call Symptomatic / Request for [name] states he is having heaviness in his legs. Pt has had this going on for the past month. Translation No Nurse Assessment Nurse: Ardine Bjork, RN, Melissa Date/Time (Eastern Time): 06/25/2021 4:53:52 PM Confirm and document reason for call. If symptomatic, describe symptoms. ---Caller states he is having heaviness in his legs. Pt has had this going on for the past month. Sx are intermittent. Denies fever, cough or sob. Has slight swelling in bilat legs-right leg more swollen-swelling started approx. Sept 2022. Edema is located up to knees. Does the patient have any new or worsening symptoms? ---Yes Will a triage be completed? ---Yes Related visit to physician within the last 2 weeks? ---No Does the PT have any chronic conditions? (i.e. diabetes, asthma, this includes High risk factors for pregnancy, etc.) ---Yes List chronic conditions. ---HTN. Is this a behavioral health or substance abuse call? ---No Guidelines Guideline Title Affirmed Question Affirmed Notes Nurse Date/Time Eilene Ghazi Time) Leg Swelling and Edema [1] Thigh, calf, or ankle swelling AND [2] bilateral AND [3] 1 side is more swollen Ardine Bjork, RN, Melissa 06/25/2021 4:57:48 PM Disp. Time Eilene Ghazi Time) Disposition Final  User PLEASE NOTE: All timestamps contained within this report are represented as [place] Standard Time. CONFIDENTIALTY NOTICE: This fax transmission is intended only for the addressee. It contains information that is legally privileged, confidential or otherwise protected from use or disclosure. If you are not the intended recipient, you are strictly prohibited from reviewing, disclosing, copying using or disseminating any of this information or taking any action in reliance on or regarding this information. If you have received this fax in error, please notify us immediately by telephone so that we can arrange for its return to [place]. Phone: [phone], Toll-Free: [phone], Fax: [phone] Page: 2 of 2 Call Id: [id number] 06/25/2021 5:02:30 PM See HCP within 4 Hours (or PCP triage) Yes Zayas, RN, Lenna Sciara Caller Disagree/Comply Disagree Caller Understands Yes PreDisposition Did not know what to do Care Advice Given Per Guideline SEE HCP (OR PCP TRIAGE) WITHIN 4 HOURS: * IF OFFICE WILL BE CLOSED AND NO PCP (PRIMARY CARE PROVIDER) SECOND-LEVEL TRIAGE: You need to be seen within the next 3 or 4 hours. A nearby Urgent Care Center [facility]) is often a good source of care. Another choice is to go to the ED. Go sooner if you become worse. CARE ADVICE given per Leg Swelling and Edema (Adult) guideline. CALL EMS IF: * Chest pain or shortness of breath occurs. Referrals GO TO FACILITY REFUSED

## 2021-06-27 DIAGNOSIS — M79606 Pain in leg, unspecified: Secondary | ICD-10-CM | POA: Insufficient documentation

## 2021-06-27 NOTE — Assessment & Plan Note (Signed)
We went through his history multiple times.  He was clear at this point that stopping losartan did not help.  I think it makes sense to stop atorvastatin for 2 weeks to see if that makes a difference and I sent a note to cardiology about getting him set up for peripheral arterial disease evaluation with ABIs.  I appreciate the help of all involved.  I want him to update me if he notices any change of the statin.  No change in medications otherwise at this point.  He does not have any skin breakdown or ulceration.

## 2021-06-28 ENCOUNTER — Telehealth: Payer: Self-pay | Admitting: Cardiology

## 2021-06-28 ENCOUNTER — Telehealth: Payer: Self-pay | Admitting: *Deleted

## 2021-06-28 DIAGNOSIS — I739 Peripheral vascular disease, unspecified: Secondary | ICD-10-CM

## 2021-06-28 NOTE — Telephone Encounter (Signed)
Called pt's daughter to give her the update about testing.

## 2021-06-28 NOTE — Telephone Encounter (Signed)
-----   Message from Minus Breeding, MD sent at 06/27/2021  7:59 AM EDT ----- Regarding: RE: PAD eval Jdyn Parkerson,  Can we order ABIs on this patient please.   Thanks.  ----- Message ----- From: Tonia Ghent, MD Sent: 06/26/2021   5:02 PM EDT To: Minus Breeding, MD Subject: RE: PAD eval                                   Not concerned about venous issues.  Either ABI vs arterial dopplers.  I would take either but I didn't know which could be done sooner.    And I thank you for your consideration and quick response.   Brigitte Pulse  ----- Message ----- From: Minus Breeding, MD Sent: 06/26/2021   3:52 PM EDT To: Tonia Ghent, MD Subject: RE: PAD eval                                   ABIs?  We can order if that is what you are wanting.  Doesn't look like you are worried about venous issues correct?  Jake ----- Message ----- From: Tonia Ghent, MD Sent: 06/26/2021   3:47 PM EDT To: Minus Breeding, MD Subject: PAD eval                                       Saw the gentleman in clinic today.  He has bilateral leg pain that hasn't improved with changing his BP meds.  He has intact DP pulses bilaterally.  How can we expedite PAD eval with arterial studies?  Should I put in the order?  Please let me know.    He doesn't have rest pain but has claudication intermittently.  No ulceration.  Thanks.   Brigitte Pulse

## 2021-06-28 NOTE — Telephone Encounter (Signed)
Patient is seeing cardiology tomorrow at 3:00 pm and wife states she was told he will be having ABI done.

## 2021-06-28 NOTE — Telephone Encounter (Signed)
Spoke with pt daughter about the test her father is supposed to have. She states his leg pain is getting worst to the point where he can barley walk. Dr. Damita Dunnings said he would expedite the ABI by talking to Dr. Percival Spanish. They have not heard back from this office so the daughter we reaching out. Pt's Brother just died with similar symptoms, they are blood clot.  Will relay message to Dr. Percival Spanish for advise. Pt last seen in this office 4/4

## 2021-06-28 NOTE — Telephone Encounter (Addendum)
I was concerned about getting ABIs done.  My concern was not about venous changes, especially since he has symmetric symptoms.  I would try to get the ABIs done and go from there.  If this can't happen in the near future, then please let me know. Thanks.

## 2021-06-28 NOTE — Telephone Encounter (Signed)
Pt's daughter Terance Hart (dpr) is calling to follow up regarding a scan that Dr. Damita Dunnings was supposed to speak with Dr. Percival Spanish about for Mr. Milleson. Pt's daughter is not sure of what type of scan it is. Please advise Mckenzie  further [phone]

## 2021-06-28 NOTE — Telephone Encounter (Signed)
Called pt's wife to update her on the testing for her husband. Will call daughter to give an update as well.

## 2021-06-28 NOTE — Telephone Encounter (Signed)
Spoke with pt, dopplers scheduled.

## 2021-06-29 ENCOUNTER — Ambulatory Visit (HOSPITAL_COMMUNITY)
Admission: RE | Admit: 2021-06-29 | Discharge: 2021-06-29 | Disposition: A | Discharge status: Home / Self Care | Payer: Medicare HMO | Source: Ambulatory Visit | Attending: Internal Medicine | Admitting: Internal Medicine

## 2021-06-29 ENCOUNTER — Other Ambulatory Visit: Payer: Self-pay

## 2021-06-29 DIAGNOSIS — I739 Peripheral vascular disease, unspecified: Secondary | ICD-10-CM | POA: Diagnosis not present

## 2021-06-29 NOTE — Telephone Encounter (Signed)
Noted. Thanks.

## 2021-07-02 ENCOUNTER — Telehealth: Payer: Self-pay | Admitting: Cardiology

## 2021-07-02 DIAGNOSIS — M79606 Pain in leg, unspecified: Secondary | ICD-10-CM

## 2021-07-02 NOTE — Telephone Encounter (Signed)
Tonia Ghent, MD  07/01/2021  9:23 PM EDT     Call pt.  His testing was normal.  Can he see any difference off the atorvastatin?  Please let me know.     Thanks.    Minus Breeding, MD  07/01/2021  4:45 PM EDT     Normal exam.    Results sent to Tonia Ghent, MD.     I did not communicate the results to the patient as I did not see him for this problem.  I am happy to see him if there is an further question about his leg pain that being vascular.       Spoke to patient and wife-aware of results.   Reports pain has continued, even off the atorvastatin (stopped on 10/11).   He is still in a lot of pain when walking. Wife reports when he gets up in the mornings he can hardly walk.   Pain eases up when he sits down.   Wife continues to be very concerned. Advised would updated Dr. Percival Spanish and Dr. Damita Dunnings and will call back with additional recommendations.

## 2021-07-02 NOTE — Telephone Encounter (Signed)
Noted.  Will await the xray report.  Thanks.

## 2021-07-02 NOTE — Telephone Encounter (Signed)
Patient stated that the pain is more severe in the mornings after he gets up. He does not have pains in the middle of the night; just when he gets up every am. He mainly sleeps on his side and some on his back but mostly on his side. Patient states the pain does not seem to go away until around lunch time. Then becomes more weak in the afternoons. Patient states theres not really pain in the afternoon as much as it is weak feeling. He was okay with going to get the xray done and will go tomorrow morning.

## 2021-07-02 NOTE — Telephone Encounter (Signed)
If the pain isn't getting better and is note more when upright and less when sitting, then we need to check his back.  I put in the order for L spine xray.  If that doesn't give an answer, we may need to check MRI L spine.  I would start with the xray.  Please schedule when possible.  I put in the order for [facility] imaging site.

## 2021-07-02 NOTE — Telephone Encounter (Signed)
Patient's spouse is call for results to his ABI test he had done Friday.

## 2021-07-03 ENCOUNTER — Ambulatory Visit
Admission: RE | Admit: 2021-07-03 | Discharge: 2021-07-03 | Disposition: A | Discharge status: Home / Self Care | Payer: Medicare HMO | Attending: Family Medicine | Admitting: Family Medicine

## 2021-07-03 ENCOUNTER — Ambulatory Visit
Admission: RE | Admit: 2021-07-03 | Discharge: 2021-07-03 | Disposition: A | Discharge status: Home / Self Care | Payer: Medicare HMO | Source: Ambulatory Visit | Attending: Family Medicine | Admitting: Family Medicine

## 2021-07-03 DIAGNOSIS — M79606 Pain in leg, unspecified: Secondary | ICD-10-CM | POA: Insufficient documentation

## 2021-07-03 DIAGNOSIS — M545 Low back pain, unspecified: Secondary | ICD-10-CM | POA: Diagnosis not present

## 2021-07-04 ENCOUNTER — Telehealth: Payer: Self-pay | Admitting: Family Medicine

## 2021-07-04 DIAGNOSIS — Z8249 Family history of ischemic heart disease and other diseases of the circulatory system: Secondary | ICD-10-CM

## 2021-07-04 DIAGNOSIS — M79606 Pain in leg, unspecified: Secondary | ICD-10-CM

## 2021-07-04 NOTE — Telephone Encounter (Signed)
Patient requesting xray results

## 2021-07-05 ENCOUNTER — Encounter (INDEPENDENT_AMBULATORY_CARE_PROVIDER_SITE_OTHER): Payer: Medicare HMO | Admitting: Ophthalmology

## 2021-07-05 NOTE — Telephone Encounter (Signed)
Called and informed wife. She will let office know if not received call for referral or MRI in two weeks. Will try tylenol and let office know how that goes.

## 2021-07-05 NOTE — Telephone Encounter (Signed)
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  If stopping atorvastatin has not made any difference then I do not suspect that to be the main issue and he can restart that medication.  My understanding is that he has positional pain in his legs that is worse when he gets up to stand so I suspect the issue is still coming from his back.  The options are to either get an MRI and/or refer him to orthopedics/spine clinic.  I think it makes sense to get the MRI done first (as that would help any specialist with treatment) and I went ahead and put in the order for that.  What is he taking for pain at this point?  Please let me know.  Thanks.

## 2021-07-05 NOTE — Addendum Note (Signed)
Addended by: Tonia Ghent on: 07/05/2021 03:39 PM   Modules accepted: Orders

## 2021-07-05 NOTE — Telephone Encounter (Signed)
Reviewed all information with wife. She is agreed with suggested plan. They would like to move forward with MRI and referral.   Per wife he is not taking anything at all for pain. He is not using anything over the counter at all. Patient wife was wondering if he needed to see vascular as well due to family history of patient brother having same symptoms and dx with aneurism in legs.

## 2021-07-05 NOTE — Telephone Encounter (Signed)
Pt wife called back and said that pt is still in a lot of pain and said they need to know what the next step is because he cant keep going like this. She requested a call back for advice. Call back [phone]

## 2021-07-05 NOTE — Addendum Note (Signed)
Addended by: Tonia Ghent on: 07/05/2021 04:26 PM   Modules accepted: Orders

## 2021-07-05 NOTE — Telephone Encounter (Signed)
I would try tylenol for the pain.  I would get the MRI prior to spine referral.  I went ahead with the vascular referral.  Thanks.

## 2021-07-12 ENCOUNTER — Other Ambulatory Visit: Payer: Self-pay

## 2021-07-12 ENCOUNTER — Encounter (INDEPENDENT_AMBULATORY_CARE_PROVIDER_SITE_OTHER): Payer: Self-pay | Admitting: Ophthalmology

## 2021-07-12 ENCOUNTER — Ambulatory Visit (INDEPENDENT_AMBULATORY_CARE_PROVIDER_SITE_OTHER): Payer: Medicare HMO | Admitting: Ophthalmology

## 2021-07-12 ENCOUNTER — Telehealth: Payer: Self-pay | Admitting: Family Medicine

## 2021-07-12 DIAGNOSIS — H353211 Exudative age-related macular degeneration, right eye, with active choroidal neovascularization: Secondary | ICD-10-CM | POA: Diagnosis not present

## 2021-07-12 DIAGNOSIS — H353231 Exudative age-related macular degeneration, bilateral, with active choroidal neovascularization: Secondary | ICD-10-CM

## 2021-07-12 DIAGNOSIS — H353221 Exudative age-related macular degeneration, left eye, with active choroidal neovascularization: Secondary | ICD-10-CM | POA: Diagnosis not present

## 2021-07-12 MED ORDER — BEVACIZUMAB 2.5 MG/0.1ML IZ SOSY
2.5000 mg | PREFILLED_SYRINGE | INTRAVITREAL | Status: AC | PRN
  Administered 2021-07-12: 2.5 mg via INTRAVITREAL

## 2021-07-12 NOTE — Telephone Encounter (Signed)
Pt wife called stating that pt haven't heard anything concerning the MRI. Pt wife also states that pt is still having problems with pt legs,there has been no change at all.Pt wife states that they did get a text from [place] regarding the referral for the Vascular doctor but still waiting on the appt.

## 2021-07-12 NOTE — Progress Notes (Signed)
07/12/2021     CHIEF COMPLAINT Patient presents for  Chief Complaint  Patient presents with   Retina Follow Up      HISTORY OF PRESENT ILLNESS: Alan [name] is a 75 y.o. male who presents to the clinic today for:   HPI     Retina Follow Up   Patient presents with  Wet AMD.  In left eye.  This started 8 weeks ago.  Severity is mild.  Duration of 8.  Since onset it is stable.        Comments   8 weeks fu OS oct avastin OS. Patient states vision is stable and unchanged since last visit. Denies any new floaters or FOL. Pt uses dorz-tim bid OU.      Last edited by Laurin Coder on 07/12/2021 10:05 AM.      Referring physician: Tonia Ghent, MD [place],  [place] [address]  HISTORICAL INFORMATION:   Selected notes from the MEDICAL RECORD NUMBER    Lab Results  Component Value Date   HGBA1C 5.3 09/04/2020     CURRENT MEDICATIONS: No current outpatient medications on file. (Ophthalmic Drugs)   No current facility-administered medications for this visit. (Ophthalmic Drugs)   Current Outpatient Medications (Other)  Medication Sig   allopurinol (ZYLOPRIM) 100 MG tablet Take 1 tablet (100 mg total) by mouth daily.   amLODipine (NORVASC) 10 MG tablet Take 1 tablet (10 mg total) by mouth daily.   aspirin 81 MG tablet Take 81 mg by mouth daily.   atorvastatin (LIPITOR) 40 MG tablet Take 1 tablet (40 mg total) by mouth daily.   doxazosin (CARDURA) 1 MG tablet Take 2 tablets (2 mg total) by mouth daily.   metoprolol tartrate (LOPRESSOR) 100 MG tablet Take 1 tablet (100 mg total) by mouth 2 (two) times daily.   sildenafil (REVATIO) 20 MG tablet Take 5 tablets (100 mg total) by mouth daily as needed.   No current facility-administered medications for this visit. (Other)      REVIEW OF SYSTEMS:    ALLERGIES Allergies  Allergen Reactions   Hctz [Hydrochlorothiazide] Other (See Comments)    Would avoid due to h/o gout.     Septra  [Sulfamethoxazole-Trimethoprim]     diarrhea   Spironolactone Other (See Comments)    Possible cause of aches.     Valsartan Other (See Comments)    Possible cause of aches.      PAST MEDICAL HISTORY Past Medical History:  Diagnosis Date   CAD (coronary artery disease) 07/17/2004   Diverticulosis of colon 12/15/2005   Esophageal stricture    Gastritis    GERD (gastroesophageal reflux disease) 09/16/1996   Glaucoma    Heart disease    History of tobacco abuse    Quit 2000   Hx of cardiovascular stress test    ETT-Myoview (01/2014):  1.5 mm ST depression in inf leads at peak exercise; no ischemia or scar, EF 57%, Low Risk   Hyperlipidemia 03/17/1999   Hypertension 03/17/1999   Stress fracture of foot    right midfoot, per Dr. Canary Brim 2012   Past Surgical History:  Procedure Laterality Date   CATARACT EXTRACTION, BILATERAL     COLONOSCOPY  12/30/2005   Divertics, mild, 10 yrs   COLONOSCOPY  11/26/2006   Colitis biopsy neg   CORONARY ARTERY BYPASS GRAFT  07/27/2004   By Dr. Roxy Manns with a LIMA to the LAD, RIMA to the distal right coronary artery, spahenous  vein graft first diagonal, saphenous vein graft to the first circumflex marginal, sequential saphenous vein graft to the second   ESOPHAGOGASTRODUODENOSCOPY  10/08/2000   Stricture distal esoph, repeat dilation done 2012   ESOPHAGOGASTRODUODENOSCOPY  10/08/2000   Stricture distal esoph   I & D EXTREMITY Left 01/24/2017   Procedure: MINOR INCISION AND DRAINAGE LEFT THUMB;  Surgeon: Leanora Cover, MD;  Location: [facility];  Service: Orthopedics;  Laterality: Left;    FAMILY HISTORY Family History  Problem Relation Age of Onset   Peripheral vascular disease Mother    Stroke Father        x2   Hypertension Brother    Heart attack Brother        MI   Colon polyps Brother    Heart disease Brother        MI   Hypertension Sister    Breast cancer Sister    Hypertension Sister    Hypertension Sister    Colon polyps  Other    Diabetes Neg Hx    Depression Neg Hx    Alcohol abuse Neg Hx    Drug abuse Neg Hx    Prostate cancer Neg Hx    Colon cancer Neg Hx     SOCIAL HISTORY Social History   Tobacco Use   Smoking status: Former    Types: Cigarettes    Quit date: 09/16/1998    Years since quitting: 22.8   Smokeless tobacco: Never  Vaping Use   Vaping Use: Never used  Substance Use Topics   Alcohol use: Yes    Comment: occasional beer   Drug use: No         OPHTHALMIC EXAM:  Base Eye Exam     Visual Acuity (ETDRS)       Right Left   Dist [place] 20/20 20/20 -2         Tonometry (Tonopen, 10:01 AM)       Right Left   Pressure 16 19         Pupils       Dark Light APD   Right 3 2 None   Left 3 2 None         Extraocular Movement       Right Left    Full Full         Neuro/Psych     Oriented x3: Yes   Mood/Affect: Normal         Dilation     Both eyes: 1.0% Mydriacyl, 2.5% Phenylephrine @ 10:01 AM           Slit Lamp and Fundus Exam     External Exam       Right Left   External Normal Normal         Slit Lamp Exam       Right Left   Lids/Lashes Normal Normal   Conjunctiva/Sclera White and quiet White and quiet   Cornea Clear Clear   Anterior Chamber Deep and quiet Deep and quiet   Iris Round and reactive Round and reactive   Lens Centered posterior chamber intraocular lens Centered posterior chamber intraocular lens   Anterior Vitreous Normal Normal         Fundus Exam       Right Left   Posterior Vitreous  Normal   Disc  Peripapillary CNVM temporal aspect of the nerve with subretinal hemorrhage   C/D Ratio  0.05   Macula  Normal   Vessels  Normal   Periphery  Normal            IMAGING AND PROCEDURES  Imaging and Procedures for 07/12/21  OCT, Retina - OU - Both Eyes       Right Eye Quality was good. Scan locations included subfoveal. Central Foveal Thickness: 259. Progression has worsened. Findings include  vitreomacular adhesion , abnormal foveal contour, subretinal hyper-reflective material, subretinal fluid.   Left Eye Quality was good. Scan locations included subfoveal. Central Foveal Thickness: 261. Progression has worsened. Findings include vitreomacular adhesion , abnormal foveal contour, subretinal hyper-reflective material, subretinal fluid.   Notes OD, beneath the p.m. bundle temporal aspect of nerve subretinal hyper reflective material adjacent to active CNVM likely prior active CNVM, and smaller hemorrhage superior pole of nerve OD  OS vastly improved macular lesion along the p.m. bundle and peripapillary, 8 weeks after recent injection Avastin.  Repeat OS today and examination next in 8 weeks     Intravitreal Injection, Pharmacologic Agent - OS - Left Eye       Time Out 07/12/2021. 10:28 AM. Confirmed correct patient, procedure, site, and patient consented.   Anesthesia Topical anesthesia was used. Anesthetic medications included Lidocaine 4%.   Procedure Preparation included 10% betadine to eyelids, 5% betadine to ocular surface, Ofloxacin . A 30 gauge needle was used.   Injection: 2.5 mg bevacizumab 2.5 MG/0.1ML   Route: Intravitreal, Site: Left Eye   NDC: [phone], Lot: [id number]   Post-op Post injection exam found visual acuity of at least counting fingers. The patient tolerated the procedure well. There were no complications. The patient received written and verbal post procedure care education. Post injection medications included ocuflox.              ASSESSMENT/PLAN:  Exudative age-related macular degeneration of right eye with active choroidal neovascularization (HCC) OD at 6 weeks postinjection still improved macular and peripapillary findings, follow-up as scheduled  Exudative age-related macular degeneration of left eye with active choroidal neovascularization (HCC) OS, peripapillary CNVM under the p.m. bundle vastly improved as compared to 8 weeks  previous.  Repeat injection today Avastin and examination next OS in 8 weeks     ICD-10-CM   1. Exudative age-related macular degeneration of left eye with active choroidal neovascularization (HCC)  H35.3221 OCT, Retina - OU - Both Eyes    Intravitreal Injection, Pharmacologic Agent - OS - Left Eye    bevacizumab (AVASTIN) SOSY 2.5 mg    2. Exudative age-related macular degeneration of right eye with active choroidal neovascularization ([place])  H35.3211       1.  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  2.  OS today at 8-week follow-up post Avastin repeat today and maintain 8-week follow-up and extend after next visit  3. OD follow-up as scheduled  Ophthalmic Meds Ordered this visit:  Meds ordered this encounter  Medications   bevacizumab (AVASTIN) SOSY 2.5 mg       Return in about 8 weeks (around 09/06/2021) for dilate, OS, AVASTIN OCT,, follow-up OD as scheduled.  There are no Patient Instructions on file for this visit.   Explained the diagnoses, plan, and follow up with the patient and they expressed understanding.  Patient expressed understanding of the importance of proper follow up care.   Clent Demark Zakara Parkey M.D. Diseases & Surgery of the Retina and Vitreous [facility] [place] 07/12/21     Abbreviations: M myopia (nearsighted); A astigmatism; H hyperopia (  farsighted); P presbyopia; Mrx spectacle prescription;  CTL contact lenses; OD right eye; OS left eye; OU both eyes  XT exotropia; ET esotropia; PEK punctate epithelial keratitis; PEE punctate epithelial erosions; DES dry eye syndrome; MGD meibomian gland dysfunction; ATs artificial tears; PFAT's preservative free artificial tears; [place] nuclear sclerotic cataract; PSC posterior subcapsular cataract; ERM epi-retinal membrane; PVD posterior vitreous detachment; RD retinal detachment; DM diabetes mellitus; DR diabetic retinopathy; NPDR non-proliferative  diabetic retinopathy; PDR proliferative diabetic retinopathy; CSME clinically significant macular edema; DME diabetic macular edema; dbh dot blot hemorrhages; CWS cotton wool spot; POAG primary open angle glaucoma; C/D cup-to-disc ratio; HVF humphrey visual field; GVF goldmann visual field; OCT optical coherence tomography; IOP intraocular pressure; BRVO Branch retinal vein occlusion; CRVO central retinal vein occlusion; CRAO central retinal artery occlusion; BRAO branch retinal artery occlusion; RT retinal tear; SB scleral buckle; PPV pars plana vitrectomy; VH Vitreous hemorrhage; PRP panretinal laser photocoagulation; IVK intravitreal kenalog; VMT vitreomacular traction; MH Macular hole;  NVD neovascularization of the disc; NVE neovascularization elsewhere; AREDS age related eye disease study; ARMD age related macular degeneration; POAG primary open angle glaucoma; EBMD epithelial/anterior basement membrane dystrophy; ACIOL anterior chamber intraocular lens; IOL intraocular lens; PCIOL posterior chamber intraocular lens; Phaco/IOL phacoemulsification with intraocular lens placement; [place] photorefractive keratectomy; LASIK laser assisted in situ keratomileusis; HTN hypertension; DM diabetes mellitus; COPD chronic obstructive pulmonary disease

## 2021-07-12 NOTE — Assessment & Plan Note (Signed)
OD at 6 weeks postinjection still improved macular and peripapillary findings, follow-up as scheduled

## 2021-07-12 NOTE — Assessment & Plan Note (Signed)
OS, peripapillary CNVM under the p.m. bundle vastly improved as compared to 8 weeks previous.  Repeat injection today Avastin and examination next OS in 8 weeks

## 2021-07-12 NOTE — Telephone Encounter (Signed)
MRI approved by [facility] Confirmation  Attention: [facility] Confirmation Date: Jul 12 2021 - Aug 11 2021 Facility: [facility] Number: [id number] Appointment/Approval Date:  07/12/2021 Procedure: [id number] MRI LUMBAR SPINE WO DYE, [id number] MRI LUMBAR SPINE WDYE, 67209 MRI LUMBAR SPINE WO & WDYE Diagnosis: M79.606 Pain in leg, unspecified  [facility] requires the patient to schedule MRIs - they will call the patient or the patient can reach out to them [phone]. Mychart message sent to patient.     The [facility] Referral was sent to Dr Trula Slade in [place]. They will call the schedule the patient.

## 2021-07-18 ENCOUNTER — Telehealth: Payer: Self-pay | Admitting: Family Medicine

## 2021-07-18 NOTE — Telephone Encounter (Signed)
Pt requesting refill on doxazosin ...   would like the 2mg  tablets, instead of 1mg .

## 2021-07-19 ENCOUNTER — Telehealth: Payer: Self-pay | Admitting: Family Medicine

## 2021-07-19 NOTE — Telephone Encounter (Signed)
  Encourage patient to contact the pharmacy for refills or they can request refills through [facility]:  Please schedule appointment if longer than 1 year  NEXT APPOINTMENT DATE:no future appt  MEDICATION:doxazosin (CARDURA) 1 MG tablet  Is the patient out of medication? no  PHARMACY:[id number] [place], [place] - [address]  Let patient know to contact pharmacy at the end of the day to make sure medication is ready.  Please notify patient to allow 48-72 hours to process  CLINICAL FILLS OUT ALL BELOW:   LAST REFILL:  QTY:  REFILL DATE:    OTHER COMMENTS:    Okay for refill?  Please advise

## 2021-07-19 NOTE — Telephone Encounter (Signed)
This is a duplicate request. See other TE. Rx request has been sent to Dr. Damita Dunnings for review.

## 2021-07-19 NOTE — Telephone Encounter (Signed)
Pt called to f/u on medication change request. He has 4 pills left (4 days).

## 2021-07-20 MED ORDER — DOXAZOSIN MESYLATE 2 MG PO TABS: 2.0000 mg | ORAL_TABLET | Freq: Every day | ORAL | 3 refills | Status: DC

## 2021-07-20 NOTE — Telephone Encounter (Signed)
Patient notified rx was sent 

## 2021-07-20 NOTE — Addendum Note (Signed)
Addended by: Tonia Ghent on: 07/20/2021 01:40 PM   Modules accepted: Orders

## 2021-07-20 NOTE — Telephone Encounter (Signed)
New rx sent  Thanks!

## 2021-07-23 ENCOUNTER — Other Ambulatory Visit: Payer: Self-pay

## 2021-07-23 ENCOUNTER — Ambulatory Visit (INDEPENDENT_AMBULATORY_CARE_PROVIDER_SITE_OTHER): Payer: Medicare HMO | Admitting: Ophthalmology

## 2021-07-23 ENCOUNTER — Encounter (INDEPENDENT_AMBULATORY_CARE_PROVIDER_SITE_OTHER): Payer: Self-pay | Admitting: Ophthalmology

## 2021-07-23 DIAGNOSIS — H353221 Exudative age-related macular degeneration, left eye, with active choroidal neovascularization: Secondary | ICD-10-CM | POA: Diagnosis not present

## 2021-07-23 DIAGNOSIS — H353231 Exudative age-related macular degeneration, bilateral, with active choroidal neovascularization: Secondary | ICD-10-CM

## 2021-07-23 DIAGNOSIS — H35051 Retinal neovascularization, unspecified, right eye: Secondary | ICD-10-CM | POA: Diagnosis not present

## 2021-07-23 DIAGNOSIS — H353211 Exudative age-related macular degeneration, right eye, with active choroidal neovascularization: Secondary | ICD-10-CM

## 2021-07-23 MED ORDER — BEVACIZUMAB 2.5 MG/0.1ML IZ SOSY
2.5000 mg | PREFILLED_SYRINGE | INTRAVITREAL | Status: AC | PRN
  Administered 2021-07-23: 2.5 mg via INTRAVITREAL

## 2021-07-23 NOTE — Progress Notes (Signed)
07/23/2021     CHIEF COMPLAINT Patient presents for  Chief Complaint  Patient presents with   Retina Follow Up      HISTORY OF PRESENT ILLNESS: Alan [name] is a 75 y.o. male who presents to the clinic today for:   HPI     Retina Follow Up   Patient presents with  Wet AMD.  In right eye.  This started 8 weeks ago.  Duration of 8 weeks.  Since onset it is stable.        Comments   EyeMeds: Timolol XR BID OU      Last edited by Reather Littler, COA on 07/23/2021  8:17 AM.      Referring physician: Tonia Ghent, MD [place],  [place] [address]  HISTORICAL INFORMATION:   Selected notes from the MEDICAL RECORD NUMBER    Lab Results  Component Value Date   HGBA1C 5.3 09/04/2020     CURRENT MEDICATIONS: Current Outpatient Medications (Ophthalmic Drugs)  Medication Sig   timolol (TIMOPTIC-XR) 0.25 % ophthalmic gel-forming Place 1 drop into the left eye in the morning and at bedtime.   No current facility-administered medications for this visit. (Ophthalmic Drugs)   Current Outpatient Medications (Other)  Medication Sig   allopurinol (ZYLOPRIM) 100 MG tablet Take 1 tablet (100 mg total) by mouth daily.   amLODipine (NORVASC) 10 MG tablet Take 1 tablet (10 mg total) by mouth daily.   aspirin 81 MG tablet Take 81 mg by mouth daily.   atorvastatin (LIPITOR) 40 MG tablet Take 1 tablet (40 mg total) by mouth daily.   doxazosin (CARDURA) 2 MG tablet Take 1 tablet (2 mg total) by mouth daily.   metoprolol tartrate (LOPRESSOR) 100 MG tablet Take 1 tablet (100 mg total) by mouth 2 (two) times daily.   sildenafil (REVATIO) 20 MG tablet Take 5 tablets (100 mg total) by mouth daily as needed.   No current facility-administered medications for this visit. (Other)      REVIEW OF SYSTEMS:    ALLERGIES Allergies  Allergen Reactions   Hctz [Hydrochlorothiazide] Other (See Comments)    Would avoid due to h/o gout.     Septra  [Sulfamethoxazole-Trimethoprim]     diarrhea   Spironolactone Other (See Comments)    Possible cause of aches.     Valsartan Other (See Comments)    Possible cause of aches.      PAST MEDICAL HISTORY Past Medical History:  Diagnosis Date   CAD (coronary artery disease) 07/17/2004   Diverticulosis of colon 12/15/2005   Esophageal stricture    Gastritis    GERD (gastroesophageal reflux disease) 09/16/1996   Glaucoma    Heart disease    History of tobacco abuse    Quit 2000   Hx of cardiovascular stress test    ETT-Myoview (01/2014):  1.5 mm ST depression in inf leads at peak exercise; no ischemia or scar, EF 57%, Low Risk   Hyperlipidemia 03/17/1999   Hypertension 03/17/1999   Stress fracture of foot    right midfoot, per Dr. Canary Brim 2012   Past Surgical History:  Procedure Laterality Date   CATARACT EXTRACTION, BILATERAL     COLONOSCOPY  12/30/2005   Divertics, mild, 10 yrs   COLONOSCOPY  11/26/2006   Colitis biopsy neg   CORONARY ARTERY BYPASS GRAFT  07/27/2004   By Dr. Roxy Manns with a LIMA to the LAD, RIMA to the distal right coronary artery, spahenous vein graft first  diagonal, saphenous vein graft to the first circumflex marginal, sequential saphenous vein graft to the second   ESOPHAGOGASTRODUODENOSCOPY  10/08/2000   Stricture distal esoph, repeat dilation done 2012   ESOPHAGOGASTRODUODENOSCOPY  10/08/2000   Stricture distal esoph   I & D EXTREMITY Left 01/24/2017   Procedure: MINOR INCISION AND DRAINAGE LEFT THUMB;  Surgeon: Leanora Cover, MD;  Location: [facility];  Service: Orthopedics;  Laterality: Left;    FAMILY HISTORY Family History  Problem Relation Age of Onset   Peripheral vascular disease Mother    Stroke Father        x2   Hypertension Brother    Heart attack Brother        MI   Colon polyps Brother    Heart disease Brother        MI   Hypertension Sister    Breast cancer Sister    Hypertension Sister    Hypertension Sister    Colon polyps  Other    Diabetes Neg Hx    Depression Neg Hx    Alcohol abuse Neg Hx    Drug abuse Neg Hx    Prostate cancer Neg Hx    Colon cancer Neg Hx     SOCIAL HISTORY Social History   Tobacco Use   Smoking status: Former    Types: Cigarettes    Quit date: 09/16/1998    Years since quitting: 22.8   Smokeless tobacco: Never  Vaping Use   Vaping Use: Never used  Substance Use Topics   Alcohol use: Yes    Comment: occasional beer   Drug use: No         OPHTHALMIC EXAM:  Base Eye Exam     Visual Acuity (ETDRS)       Right Left   Dist  20/20 20/20         Tonometry (Tonopen, 8:22 AM)       Right Left   Pressure 17 18         Pupils       Pupils Dark Light Shape React APD   Right PERRL 3 2 Round Brisk None   Left PERRL 3 2 Round Brisk None         Visual Fields (Counting fingers)       Left Right    Full Full         Extraocular Movement       Right Left    Full, Ortho Full, Ortho         Neuro/Psych     Oriented x3: Yes   Mood/Affect: Normal         Dilation     Both eyes: 1.0% Mydriacyl, 2.5% Phenylephrine @ 8:21 AM           Slit Lamp and Fundus Exam     External Exam       Right Left   External Normal Normal         Slit Lamp Exam       Right Left   Lids/Lashes Normal Normal   Conjunctiva/Sclera White and quiet White and quiet   Cornea Clear Clear   Anterior Chamber Deep and quiet Deep and quiet   Iris Round and reactive Round and reactive   Lens Centered posterior chamber intraocular lens Centered posterior chamber intraocular lens   Anterior Vitreous Normal Normal         Fundus Exam       Right Left  Posterior Vitreous Normal Normal   Disc Peripapillary CNVM superior pole of the nerve, with subretinal hemorrhage, and apparent thickening in the p.m. bundle OD Peripapillary CNVM temporal aspect of the nerve with subretinal hemorrhage   C/D Ratio 0.05 0.05   Macula Normal Normal   Vessels Normal Normal    Periphery Normal Normal            IMAGING AND PROCEDURES  Imaging and Procedures for 07/23/21  OCT, Retina - OU - Both Eyes       Right Eye Quality was good. Scan locations included subfoveal. Central Foveal Thickness: 248. Progression has worsened. Findings include vitreomacular adhesion , abnormal foveal contour, subretinal hyper-reflective material, subretinal fluid.   Left Eye Quality was good. Scan locations included subfoveal. Central Foveal Thickness: 252. Progression has worsened. Findings include vitreomacular adhesion , abnormal foveal contour, subretinal hyper-reflective material, subretinal fluid.   Notes OD, much less active, temp, peripapillary, repeat injection today Avastin to control condition currently at 8-week interval  OS vastly improved macular lesion along the p.m. bundle and peripapillary, 1 weeksafter recent injection Avastin,, and 2 months after onset of therapy repeat OS  examination as scheduled     Intravitreal Injection, Pharmacologic Agent - OD - Right Eye       Time Out 07/23/2021. 8:50 AM. Confirmed correct patient, procedure, site, and patient consented.   Anesthesia Topical anesthesia was used. Anesthetic medications included Lidocaine 4%.   Procedure Preparation included Tobramycin 0.3%, 10% betadine to eyelids, 5% betadine to ocular surface. A 30 gauge needle was used.   Injection: 2.5 mg bevacizumab 2.5 MG/0.1ML   Route: Intravitreal, Site: Right Eye   NDC: [phone], Lot: [id number]   Post-op Post injection exam found visual acuity of at least counting fingers. The patient tolerated the procedure well. There were no complications. The patient received written and verbal post procedure care education. Post injection medications were not given.              ASSESSMENT/PLAN:  Exudative age-related macular degeneration of right eye with active choroidal neovascularization (HCC) Vastly improved 8 weeks post most recent  injection Avastin for peripapillary, sub p.m. bundle CNVM.  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 Exudative age-related macular degeneration of right eye with active choroidal neovascularization (HCC)  H35.3211 OCT, Retina - OU - Both Eyes    Intravitreal Injection, Pharmacologic Agent - OD - Right Eye    bevacizumab (AVASTIN) SOSY 2.5 mg    2. Choroidal neovascularization of right eye  H35.051     3. Exudative age-related macular degeneration of left eye with active choroidal neovascularization ([place])  H35.3221       1.  Onset of bilateral peripapillary CNVM, September 2022.  Each eye now vastly improved by OCT findings today and clinically OU D.  2.  Repeat injection today Avastin OD for peripapillary CNVM, the p.m. bundle portion of diseases is quiescent by OCT and clinically.  Persistent subretinal hemorrhage appears to superior nerve however  3.  Ophthalmic Meds Ordered  this visit:  Meds ordered this encounter  Medications   bevacizumab (AVASTIN) SOSY 2.5 mg       Return in about 8 weeks (around 09/17/2021) for dilate, OD, AVASTIN OCT.  There are no Patient Instructions on file for this visit.   Explained the diagnoses, plan, and follow up with the patient and they expressed understanding.  Patient expressed understanding of the importance of proper follow up care.   Clent Demark Bindi Klomp M.D. Diseases & Surgery of the Retina and Vitreous [facility]ina [facility]  [place] 07/23/21     Abbreviations: M myopia (nearsighted); A astigmatism; H hyperopia (farsighted); P presbyopia; Mrx spectacle prescription;  CTL contact lenses; OD right eye; OS left eye; OU both eyes  XT exotropia; ET esotropia; PEK punctate epithelial keratitis; PEE punctate epithelial erosions; DES dry eye syndrome; MGD meibomian gland dysfunction; ATs artificial tears; PFAT's preservative free artificial tears; [place] nuclear sclerotic cataract; PSC posterior subcapsular cataract; ERM epi-retinal membrane; PVD posterior vitreous detachment; RD retinal detachment; DM diabetes mellitus; DR diabetic retinopathy; NPDR non-proliferative diabetic retinopathy; PDR proliferative diabetic retinopathy; CSME clinically significant macular edema; DME diabetic macular edema; dbh dot blot hemorrhages; CWS cotton wool spot; POAG primary open angle glaucoma; C/D cup-to-disc ratio; HVF humphrey visual field; GVF goldmann visual field; OCT optical coherence tomography; IOP intraocular pressure; BRVO Branch retinal vein occlusion; CRVO central retinal vein occlusion; CRAO central retinal artery occlusion; BRAO branch retinal artery occlusion; RT retinal tear; SB scleral buckle; PPV pars plana vitrectomy; VH Vitreous hemorrhage; PRP panretinal laser photocoagulation; IVK intravitreal kenalog; VMT vitreomacular traction; MH Macular hole;  NVD neovascularization of the disc; NVE neovascularization elsewhere; AREDS age related eye disease study; ARMD age related macular degeneration; POAG primary open angle glaucoma; EBMD epithelial/anterior basement membrane dystrophy; ACIOL anterior chamber intraocular lens; IOL intraocular lens; PCIOL posterior chamber intraocular lens; Phaco/IOL phacoemulsification with intraocular lens placement; [place] photorefractive keratectomy; LASIK laser assisted in situ keratomileusis; HTN hypertension; DM diabetes mellitus; COPD chronic obstructive pulmonary disease

## 2021-07-23 NOTE — Assessment & Plan Note (Signed)
Vastly improved 8 weeks post most recent injection Avastin for peripapillary, sub p.m. bundle CNVM.  This condition no longer apparent by OCT temporally yet clinically subretinal hemorrhage superior to the nerve with CNVM  Repeat injection today and follow-up again in 8 weeks OD

## 2021-07-23 NOTE — Assessment & Plan Note (Signed)
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

## 2021-07-23 NOTE — Assessment & Plan Note (Signed)
Peripapillary CNVM more active at onset, vastly improved today some 1 week postinjection and some 2 months post onset of therapy

## 2021-07-24 ENCOUNTER — Telehealth (INDEPENDENT_AMBULATORY_CARE_PROVIDER_SITE_OTHER): Payer: Self-pay

## 2021-07-24 NOTE — Telephone Encounter (Signed)
Continued telephone note:  Pt denies any decrease in vision, no new floaters and no flashes of light. He also denies any pain. Pt advised to call us back if any of these issues were to arise.

## 2021-07-26 ENCOUNTER — Ambulatory Visit
Admission: RE | Admit: 2021-07-26 | Discharge: 2021-07-26 | Disposition: A | Discharge status: Home / Self Care | Payer: Medicare HMO | Source: Ambulatory Visit | Attending: Family Medicine | Admitting: Family Medicine

## 2021-07-26 ENCOUNTER — Other Ambulatory Visit: Payer: Self-pay

## 2021-07-26 DIAGNOSIS — M48061 Spinal stenosis, lumbar region without neurogenic claudication: Secondary | ICD-10-CM | POA: Diagnosis not present

## 2021-07-26 DIAGNOSIS — M79606 Pain in leg, unspecified: Secondary | ICD-10-CM

## 2021-07-26 DIAGNOSIS — M545 Low back pain, unspecified: Secondary | ICD-10-CM | POA: Diagnosis not present

## 2021-08-07 DIAGNOSIS — D485 Neoplasm of uncertain behavior of skin: Secondary | ICD-10-CM | POA: Diagnosis not present

## 2021-08-07 DIAGNOSIS — L821 Other seborrheic keratosis: Secondary | ICD-10-CM | POA: Diagnosis not present

## 2021-08-07 DIAGNOSIS — D225 Melanocytic nevi of trunk: Secondary | ICD-10-CM | POA: Diagnosis not present

## 2021-08-07 DIAGNOSIS — C44612 Basal cell carcinoma of skin of right upper limb, including shoulder: Secondary | ICD-10-CM | POA: Diagnosis not present

## 2021-08-07 DIAGNOSIS — L814 Other melanin hyperpigmentation: Secondary | ICD-10-CM | POA: Diagnosis not present

## 2021-08-07 DIAGNOSIS — L820 Inflamed seborrheic keratosis: Secondary | ICD-10-CM | POA: Diagnosis not present

## 2021-08-07 DIAGNOSIS — C44329 Squamous cell carcinoma of skin of other parts of face: Secondary | ICD-10-CM | POA: Diagnosis not present

## 2021-08-07 DIAGNOSIS — L570 Actinic keratosis: Secondary | ICD-10-CM | POA: Diagnosis not present

## 2021-08-13 ENCOUNTER — Other Ambulatory Visit: Payer: Self-pay

## 2021-08-13 ENCOUNTER — Encounter: Payer: Self-pay | Admitting: Surgery

## 2021-08-13 ENCOUNTER — Telehealth: Payer: Self-pay | Admitting: Family Medicine

## 2021-08-13 ENCOUNTER — Ambulatory Visit: Payer: Medicare HMO | Admitting: Surgery

## 2021-08-13 VITALS — BP 152/67 | HR 68 | Temp 98.1°F | Resp 20 | Ht 69.0 in | Wt 201.0 lb

## 2021-08-13 DIAGNOSIS — M25562 Pain in left knee: Secondary | ICD-10-CM

## 2021-08-13 DIAGNOSIS — M25561 Pain in right knee: Secondary | ICD-10-CM

## 2021-08-13 NOTE — Progress Notes (Signed)
[facility] [place]  Patient name: Alan [name] MRN: [id number] DOB: 01-Nov-1945 Sex: male   REQUESTING PROVIDER:    Dr. Damita Dunnings   REASON FOR CONSULT:    Leg pain  HISTORY OF PRESENT ILLNESS:   Alan [name] is a 75 y.o. male, who is referred for evaluation of leg pain.  He states that this has been going on for several years however it went away but returned this past September.  He describes leg pain in his calfs and in his hamstrings bilaterally when he wakes up.  He will also have it right before he goes to bed.  He denies claudication type symptoms.  Patient has a history of coronary artery disease, status post CABG.  He is medically managed for hypertension.  He takes a statin for hypercholesterolemia.  He is a former smoker  PAST MEDICAL HISTORY    Past Medical History:  Diagnosis Date   CAD (coronary artery disease) 07/17/2004   Diverticulosis of colon 12/15/2005   Esophageal stricture    Gastritis    GERD (gastroesophageal reflux disease) 09/16/1996   Glaucoma    Heart disease    History of tobacco abuse    Quit 2000   Hx of cardiovascular stress test    ETT-Myoview (01/2014):  1.5 mm ST depression in inf leads at peak exercise; no ischemia or scar, EF 57%, Low Risk   Hyperlipidemia 03/17/1999   Hypertension 03/17/1999   Stress fracture of foot    right midfoot, per Dr. Canary Brim 2012     FAMILY HISTORY   Family History  Problem Relation Age of Onset   Peripheral vascular disease Mother    Stroke Father        x2   Hypertension Brother    Heart attack Brother        MI   Colon polyps Brother    Heart disease Brother        MI   Hypertension Sister    Breast cancer Sister    Hypertension Sister    Hypertension Sister    Colon polyps Other    Diabetes Neg Hx    Depression Neg Hx    Alcohol abuse Neg Hx    Drug abuse Neg Hx    Prostate cancer Neg Hx    Colon cancer Neg Hx     SOCIAL HISTORY:    Social History   Socioeconomic History   Marital status: Married    Spouse name: Not on file   Number of children: 2   Years of education: Not on file   Highest education level: Not on file  Occupational History   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    Employer: RETIRED    Comment: Retired  Tobacco Use   Smoking status: Former    Types: Cigarettes    Quit date: 09/16/1998    Years since quitting: 22.9   Smokeless tobacco: Never  Vaping Use   Vaping Use: Never used  Substance and Sexual Activity   Alcohol use: Yes    Comment: occasional beer   Drug use: No   Sexual activity: Not on file  Other Topics Concern   Not on file  Social History Narrative   Married in [place] with wife   Enjoys golf, fishing   stretching, [employer]   [employer] fan   Social Determinants of Health   Financial Resource Strain: Not on file  Food Insecurity: Not on file  Transportation Needs: Not on file  Physical Activity: Not on file  Stress: Not on file  Social Connections: Not on file  Intimate Partner Violence: Not on file    ALLERGIES:    Allergies  Allergen Reactions   Hctz [Hydrochlorothiazide] Other (See Comments)    Would avoid due to h/o gout.     Septra [Sulfamethoxazole-Trimethoprim]     diarrhea   Spironolactone Other (See Comments)    Possible cause of aches.     Valsartan Other (See Comments)    Possible cause of aches.      CURRENT MEDICATIONS:    Current Outpatient Medications  Medication Sig Dispense Refill   allopurinol (ZYLOPRIM) 100 MG tablet Take 1 tablet (100 mg total) by mouth daily. 90 tablet 3   amLODipine (NORVASC) 10 MG tablet Take 1 tablet (10 mg total) by mouth daily. 90 tablet 3   aspirin 81 MG tablet Take 81 mg by mouth daily.     atorvastatin (LIPITOR) 40 MG tablet Take 1 tablet (40 mg total) by mouth daily. 90 tablet 3   doxazosin (CARDURA) 2 MG tablet Take 1 tablet (2 mg total) by mouth daily. 90 tablet 3   metoprolol  tartrate (LOPRESSOR) 100 MG tablet Take 1 tablet (100 mg total) by mouth 2 (two) times daily. 180 tablet 3   sildenafil (REVATIO) 20 MG tablet Take 5 tablets (100 mg total) by mouth daily as needed. 50 tablet 12   timolol (TIMOPTIC-XR) 0.25 % ophthalmic gel-forming Place 1 drop into both eyes in the morning and at bedtime.     No current facility-administered medications for this visit.    REVIEW OF SYSTEMS:   [X]  denotes positive finding, [ ]  denotes negative finding Cardiac  Comments:  Chest pain or chest pressure:    Shortness of breath upon exertion:    Short of breath when lying flat:    Irregular heart rhythm:        Vascular    Pain in calf, thigh, or hip brought on by ambulation:    Pain in feet at night that wakes you up from your sleep:     Blood clot in your veins:    Leg swelling:         Pulmonary    Oxygen at home:    Productive cough:     Wheezing:         Neurologic    Sudden weakness in arms or legs:     Sudden numbness in arms or legs:     Sudden onset of difficulty speaking or slurred speech:    Temporary loss of vision in one eye:     Problems with dizziness:         Gastrointestinal    Blood in stool:      Vomited blood:         Genitourinary    Burning when urinating:     Blood in urine:        Psychiatric    Major depression:         Hematologic    Bleeding problems:    Problems with blood clotting too easily:        Skin    Rashes or ulcers:        Constitutional    Fever or chills:     PHYSICAL EXAM:   Vitals:   08/13/21 0933  BP: (!) 152/67  Pulse: 68  Resp: 20  Temp: 98.1 F (36.7 C)  SpO2: 96%  Weight: 201 lb (91.2 kg)  Height: 5\' 9"  (1.753 m)    GENERAL: The patient is a well-nourished male, in no acute distress. The vital signs are documented above. CARDIAC: There is a regular rate and rhythm.  VASCULAR: Palpable dorsalis pedis pulses bilaterally.  1-2+ pitting edema bilaterally with hyperpigmentation PULMONARY:  Nonlabored respirations MUSCULOSKELETAL: There are no major deformities or cyanosis. NEUROLOGIC: No focal weakness or paresthesias are detected. SKIN: There are no ulcers or rashes noted. PSYCHIATRIC: The patient has a normal affect.  STUDIES:   I have reviewed his vascular studies with the following findings:  +-------+-----------+-----------+------------+------------+  ABI/TBIToday's ABIToday's TBIPrevious ABIPrevious TBI  +-------+-----------+-----------+------------+------------+  Right  1.25       0.81                                 +-------+-----------+-----------+------------+------------+  Left   1.26       0.84                                 +-------+-----------+-----------+------------+------------+ Right toe pressure is 130 Left toe pressure is 135 All waveforms are triphasic ASSESSMENT and PLAN   Leg pain: This is not an arterial problem given his normal ABIs and palpable pedal pulses.  Potentially, he could have a venous component given his pitting edema and skin changes to his calfs.  I have recommended wearing compression socks during the day to see if this gives him any benefit.  Alternatively, this could be a musculoskeletal problem or medication problem either from his statin or Cardura.  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  This is being followed by cardiology  Leia Alf, MD, FACS [facility] [phone] Pager [phone]

## 2021-08-13 NOTE — Telephone Encounter (Signed)
Please check with patient.  I saw the vascular clinic note.  They rec'd compression stockings.  If leg pain continues in spite of that then let me know.  Thanks.

## 2021-08-14 NOTE — Telephone Encounter (Signed)
Spoke with patient and he did order his compression stockings as recommended. He will call back if they do not help.

## 2021-08-15 ENCOUNTER — Telehealth: Payer: Self-pay | Admitting: Cardiology

## 2021-08-15 ENCOUNTER — Other Ambulatory Visit: Payer: Self-pay | Admitting: Family Medicine

## 2021-08-15 DIAGNOSIS — I6523 Occlusion and stenosis of bilateral carotid arteries: Secondary | ICD-10-CM

## 2021-08-15 NOTE — Telephone Encounter (Signed)
Pt is reaching out stating that it is the time of year to have Carotid done and would like to be scheduled... please advise

## 2021-08-15 NOTE — Telephone Encounter (Signed)
Last carotid 07/2020-repeat in 19m.   Order placed and sent to scheduling.

## 2021-08-24 ENCOUNTER — Other Ambulatory Visit: Payer: Self-pay

## 2021-08-24 ENCOUNTER — Ambulatory Visit (HOSPITAL_COMMUNITY)
Admission: RE | Admit: 2021-08-24 | Discharge: 2021-08-24 | Disposition: A | Discharge status: Home / Self Care | Payer: Medicare HMO | Source: Ambulatory Visit | Attending: Cardiovascular Disease | Admitting: Cardiovascular Disease

## 2021-08-24 DIAGNOSIS — I6523 Occlusion and stenosis of bilateral carotid arteries: Secondary | ICD-10-CM

## 2021-08-28 ENCOUNTER — Other Ambulatory Visit: Payer: Self-pay

## 2021-08-28 DIAGNOSIS — I6523 Occlusion and stenosis of bilateral carotid arteries: Secondary | ICD-10-CM

## 2021-09-06 ENCOUNTER — Encounter (INDEPENDENT_AMBULATORY_CARE_PROVIDER_SITE_OTHER): Payer: Medicare HMO | Admitting: Ophthalmology

## 2021-09-18 ENCOUNTER — Ambulatory Visit (INDEPENDENT_AMBULATORY_CARE_PROVIDER_SITE_OTHER): Payer: Medicare HMO | Admitting: Ophthalmology

## 2021-09-18 ENCOUNTER — Other Ambulatory Visit: Payer: Self-pay

## 2021-09-18 ENCOUNTER — Encounter (INDEPENDENT_AMBULATORY_CARE_PROVIDER_SITE_OTHER): Payer: Self-pay | Admitting: Ophthalmology

## 2021-09-18 DIAGNOSIS — H353211 Exudative age-related macular degeneration, right eye, with active choroidal neovascularization: Secondary | ICD-10-CM | POA: Diagnosis not present

## 2021-09-18 DIAGNOSIS — H353231 Exudative age-related macular degeneration, bilateral, with active choroidal neovascularization: Secondary | ICD-10-CM | POA: Diagnosis not present

## 2021-09-18 DIAGNOSIS — H353221 Exudative age-related macular degeneration, left eye, with active choroidal neovascularization: Secondary | ICD-10-CM

## 2021-09-18 MED ORDER — BEVACIZUMAB 2.5 MG/0.1ML IZ SOSY
2.5000 mg | PREFILLED_SYRINGE | INTRAVITREAL | Status: AC | PRN
  Administered 2021-09-18: 2.5 mg via INTRAVITREAL

## 2021-09-18 NOTE — Assessment & Plan Note (Addendum)
Peripapillary CNVM onset September 2022 OD, improved and stable today at 8-week follow-up.  Repeat injection today to maintain improved status

## 2021-09-18 NOTE — Progress Notes (Signed)
09/18/2021     CHIEF COMPLAINT Patient presents for  Chief Complaint  Patient presents with   Retina Follow Up      HISTORY OF PRESENT ILLNESS: Alan [name] is a 76 y.o. male who presents to the clinic today for:   HPI     Retina Follow Up           Diagnosis: Wet AMD   Laterality: right eye   Onset: 8 weeks ago   Duration: 8 weeks   Course: stable         Comments   8 week fu OD oct avastin OD. Patient states vision is stable and unchanged since last visit. Denies any new floaters or FOL. Pt uses dorz-tim BID OU, pt requests timolol be changed to dorz- tim in medication list.       Last edited by Laurin Coder on 09/18/2021  7:56 AM.      Referring physician: Tonia Ghent, MD [place],  [place] [address]  HISTORICAL INFORMATION:   Selected notes from the MEDICAL RECORD NUMBER    Lab Results  Component Value Date   HGBA1C 5.3 09/04/2020     CURRENT MEDICATIONS: Current Outpatient Medications (Ophthalmic Drugs)  Medication Sig   timolol (TIMOPTIC-XR) 0.25 % ophthalmic gel-forming Place 1 drop into both eyes in the morning and at bedtime.   No current facility-administered medications for this visit. (Ophthalmic Drugs)   Current Outpatient Medications (Other)  Medication Sig   allopurinol (ZYLOPRIM) 100 MG tablet TAKE 1 TABLET EVERY DAY   amLODipine (NORVASC) 10 MG tablet TAKE 1 TABLET EVERY DAY   aspirin 81 MG tablet Take 81 mg by mouth daily.   atorvastatin (LIPITOR) 40 MG tablet Take 1 tablet (40 mg total) by mouth daily.   doxazosin (CARDURA) 2 MG tablet Take 1 tablet (2 mg total) by mouth daily.   metoprolol tartrate (LOPRESSOR) 100 MG tablet TAKE 1 TABLET TWICE DAILY   sildenafil (REVATIO) 20 MG tablet Take 5 tablets (100 mg total) by mouth daily as needed.   No current facility-administered medications for this visit. (Other)      REVIEW OF SYSTEMS:    ALLERGIES Allergies  Allergen Reactions   Hctz  [Hydrochlorothiazide] Other (See Comments)    Would avoid due to h/o gout.     Septra [Sulfamethoxazole-Trimethoprim]     diarrhea   Spironolactone Other (See Comments)    Possible cause of aches.     Valsartan Other (See Comments)    Possible cause of aches.      PAST MEDICAL HISTORY Past Medical History:  Diagnosis Date   CAD (coronary artery disease) 07/17/2004   Diverticulosis of colon 12/15/2005   Esophageal stricture    Gastritis    GERD (gastroesophageal reflux disease) 09/16/1996   Glaucoma    Heart disease    History of tobacco abuse    Quit 2000   Hx of cardiovascular stress test    ETT-Myoview (01/2014):  1.5 mm ST depression in inf leads at peak exercise; no ischemia or scar, EF 57%, Low Risk   Hyperlipidemia 03/17/1999   Hypertension 03/17/1999   Stress fracture of foot    right midfoot, per Dr. Canary Brim 2012   Past Surgical History:  Procedure Laterality Date   CATARACT EXTRACTION, BILATERAL     COLONOSCOPY  12/30/2005   Divertics, mild, 10 yrs   COLONOSCOPY  11/26/2006   Colitis biopsy neg   CORONARY ARTERY BYPASS GRAFT  07/27/2004   By Dr. Roxy Manns with a LIMA to the LAD, RIMA to the distal right coronary artery, spahenous vein graft first diagonal, saphenous vein graft to the first circumflex marginal, sequential saphenous vein graft to the second   ESOPHAGOGASTRODUODENOSCOPY  10/08/2000   Stricture distal esoph, repeat dilation done 2012   ESOPHAGOGASTRODUODENOSCOPY  10/08/2000   Stricture distal esoph   I & D EXTREMITY Left 01/24/2017   Procedure: MINOR INCISION AND DRAINAGE LEFT THUMB;  Surgeon: Leanora Cover, MD;  Location: [facility];  Service: Orthopedics;  Laterality: Left;    FAMILY HISTORY Family History  Problem Relation Age of Onset   Peripheral vascular disease Mother    Stroke Father        x2   Hypertension Brother    Heart attack Brother        MI   Colon polyps Brother    Heart disease Brother        MI   Hypertension Sister     Breast cancer Sister    Hypertension Sister    Hypertension Sister    Colon polyps Other    Diabetes Neg Hx    Depression Neg Hx    Alcohol abuse Neg Hx    Drug abuse Neg Hx    Prostate cancer Neg Hx    Colon cancer Neg Hx     SOCIAL HISTORY Social History   Tobacco Use   Smoking status: Former    Types: Cigarettes    Quit date: 09/16/1998    Years since quitting: 23.0   Smokeless tobacco: Never  Vaping Use   Vaping Use: Never used  Substance Use Topics   Alcohol use: Yes    Comment: occasional beer   Drug use: No         OPHTHALMIC EXAM:  Base Eye Exam     Visual Acuity (ETDRS)       Right Left   Dist [place] 20/20 -1 20/25         Tonometry (Tonopen, 7:56 AM)       Right Left   Pressure 18 21         Pupils       Pupils Dark Light   Right PERRL 3 2   Left PERRL 3 2         Extraocular Movement       Right Left    Full Full         Neuro/Psych     Oriented x3: Yes   Mood/Affect: Normal         Dilation     Right eye: 1.0% Mydriacyl, 2.5% Phenylephrine @ 7:56 AM           Slit Lamp and Fundus Exam     External Exam       Right Left   External Normal Normal         Slit Lamp Exam       Right Left   Lids/Lashes Normal Normal   Conjunctiva/Sclera White and quiet White and quiet   Cornea Clear Clear   Anterior Chamber Deep and quiet Deep and quiet   Iris Round and reactive Round and reactive   Lens Centered posterior chamber intraocular lens Centered posterior chamber intraocular lens   Anterior Vitreous Normal Normal         Fundus Exam       Right Left   Posterior Vitreous Normal    Disc Peripapillary CNVM superior pole of  the nerve, with subretinal hemorrhage smaller, and apparent thickening in the p.m. bundle OD    C/D Ratio 0.05 0.05   Macula Normal    Vessels Normal    Periphery Normal             IMAGING AND PROCEDURES  Imaging and Procedures for 09/18/21  Intravitreal Injection, Pharmacologic  Agent - OD - Right Eye       Time Out 09/18/2021. 8:33 AM. Confirmed correct patient, procedure, site, and patient consented.   Anesthesia Topical anesthesia was used. Anesthetic medications included Lidocaine 4%.   Procedure Preparation included Tobramycin 0.3%, 10% betadine to eyelids, 5% betadine to ocular surface. A 30 gauge needle was used.   Injection: 2.5 mg bevacizumab 2.5 MG/0.1ML   Route: Intravitreal, Site: Right Eye   NDC: [phone], Lot: [id number]   Post-op Post injection exam found visual acuity of at least counting fingers. The patient tolerated the procedure well. There were no complications. The patient received written and verbal post procedure care education. Post injection medications were not given.      OCT, Retina - OU - Both Eyes       Right Eye Quality was good. Scan locations included subfoveal. Central Foveal Thickness: 247. Progression has improved. Findings include vitreomacular adhesion , abnormal foveal contour, subretinal hyper-reflective material, subretinal fluid.   Left Eye Quality was good. Scan locations included subfoveal. Central Foveal Thickness: 249. Progression has improved. Findings include vitreomacular adhesion , abnormal foveal contour, subretinal hyper-reflective material, subretinal fluid.   Notes OD, much less active, temp, peripapillary, repeat injection today Avastin to control condition currently at 8-week interval, compared to onset Sept 2022.  OS vastly improved macular lesion along the p.m. 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  Repeat injection today to maintain improved status  Exudative  age-related macular degeneration of left eye with active choroidal neovascularization (HCC) Vast improved macular peripapillary findings OS as well currently at 9-week follow-up     ICD-10-CM   1. Exudative age-related macular degeneration of right eye with active choroidal neovascularization (HCC)  H35.3211 Intravitreal Injection, Pharmacologic Agent - OD - Right Eye    OCT, Retina - OU - Both Eyes    bevacizumab (AVASTIN) SOSY 2.5 mg    2. Exudative age-related macular degeneration of left eye with active choroidal neovascularization ([place])  H35.3221       OD improved macular and peripapillary region from CNVM formation onset September 2022, now at 8-week follow-up repeat injection today and extend interval examination OD to 9 weeks next  2.  OS dilate next week as scheduled  3.  Ophthalmic Meds Ordered this visit:  Meds ordered this encounter  Medications   bevacizumab (AVASTIN) SOSY 2.5 mg       Return in about 9 weeks (around 11/20/2021) for dilate, OD, AVASTIN OCT,, and follow-up OS next week as scheduled.  There are no Patient Instructions on file for this visit.   Explained the diagnoses, plan, and follow up with the patient and they expressed understanding.  Patient expressed understanding of the importance of proper follow up care.   Clent Demark Kayde Atkerson M.D. Diseases & Surgery of the Retina and Vitreous [facility] 09/18/21     Abbreviations: M myopia (nearsighted); A astigmatism; H hyperopia (farsighted); P presbyopia; Mrx spectacle prescription;  CTL contact lenses; OD right eye; OS left eye; OU both eyes  XT exotropia; ET esotropia; PEK punctate epithelial keratitis; PEE punctate epithelial erosions; DES dry eye syndrome; MGD meibomian gland dysfunction; ATs artificial tears; PFAT's preservative free artificial tears; [place] nuclear sclerotic cataract; PSC posterior subcapsular cataract; ERM epi-retinal membrane; PVD posterior vitreous detachment; RD  retinal detachment; DM diabetes mellitus; DR diabetic retinopathy; NPDR non-proliferative diabetic retinopathy; PDR proliferative diabetic retinopathy; CSME clinically significant macular edema; DME diabetic macular edema; dbh dot blot hemorrhages; CWS cotton wool spot; POAG primary open angle glaucoma; C/D cup-to-disc ratio; HVF humphrey visual field; GVF goldmann visual field; OCT optical coherence tomography; IOP intraocular pressure; BRVO Branch retinal vein occlusion; CRVO central retinal vein occlusion; CRAO central retinal artery occlusion; BRAO branch retinal artery occlusion; RT retinal tear; SB scleral buckle; PPV pars plana vitrectomy; VH Vitreous hemorrhage; PRP panretinal laser photocoagulation; IVK intravitreal kenalog; VMT vitreomacular traction; MH Macular hole;  NVD neovascularization of the disc; NVE neovascularization elsewhere; AREDS age related eye disease study; ARMD age related macular degeneration; POAG primary open angle glaucoma; EBMD epithelial/anterior basement membrane dystrophy; ACIOL anterior chamber intraocular lens; IOL intraocular lens; PCIOL posterior chamber intraocular lens; Phaco/IOL phacoemulsification with intraocular lens placement; [place] photorefractive keratectomy; LASIK laser assisted in situ keratomileusis; HTN hypertension; DM diabetes mellitus; COPD chronic obstructive pulmonary disease

## 2021-09-18 NOTE — Assessment & Plan Note (Signed)
Vast improved macular peripapillary findings OS as well currently at 9-week follow-up

## 2021-09-24 ENCOUNTER — Other Ambulatory Visit: Payer: Self-pay

## 2021-09-24 ENCOUNTER — Ambulatory Visit (INDEPENDENT_AMBULATORY_CARE_PROVIDER_SITE_OTHER): Payer: Medicare HMO | Admitting: Ophthalmology

## 2021-09-24 ENCOUNTER — Encounter (INDEPENDENT_AMBULATORY_CARE_PROVIDER_SITE_OTHER): Payer: Self-pay | Admitting: Ophthalmology

## 2021-09-24 DIAGNOSIS — H353231 Exudative age-related macular degeneration, bilateral, with active choroidal neovascularization: Secondary | ICD-10-CM | POA: Diagnosis not present

## 2021-09-24 DIAGNOSIS — H353221 Exudative age-related macular degeneration, left eye, with active choroidal neovascularization: Secondary | ICD-10-CM

## 2021-09-24 DIAGNOSIS — H353211 Exudative age-related macular degeneration, right eye, with active choroidal neovascularization: Secondary | ICD-10-CM

## 2021-09-24 MED ORDER — BEVACIZUMAB 2.5 MG/0.1ML IZ SOSY
2.5000 mg | PREFILLED_SYRINGE | INTRAVITREAL | Status: AC | PRN
  Administered 2021-09-24: 2.5 mg via INTRAVITREAL

## 2021-09-24 NOTE — Assessment & Plan Note (Signed)
6 days after recent injection, doing well

## 2021-09-24 NOTE — Assessment & Plan Note (Signed)
Today at 10 weeks post most recent injection left eye, no signs of active disease repeat injection today and repeat again evaluation 11 weeks

## 2021-09-24 NOTE — Progress Notes (Signed)
09/24/2021     CHIEF COMPLAINT Patient presents for  Chief Complaint  Patient presents with   Retina Follow Up      HISTORY OF PRESENT ILLNESS: Alan [name] is a 76 y.o. male who presents to the clinic today for:   HPI     Retina Follow Up           Diagnosis: Wet AMD   Laterality: left eye   Onset: 10 weeks ago   Severity: mild   Duration: 10 weeks   Course: stable         Comments   10 week fu OS and Avastin OS, OCT- Exudative ARMD- Last injection OS 07/12/2021 And then only 6 days post most recent injection right Pt states VA OU stable since last visit. Pt denies FOL, floaters, or ocular pain OU.   Pt reports using Dorzolamide BID Ou       Last edited by Hurman Horn, MD on 09/24/2021  9:02 AM.      Referring physician: Monna Fam, MD [place],  [place] [address]  HISTORICAL INFORMATION:   Selected notes from the MEDICAL RECORD NUMBER    Lab Results  Component Value Date   HGBA1C 5.3 09/04/2020     CURRENT MEDICATIONS: Current Outpatient Medications (Ophthalmic Drugs)  Medication Sig   timolol (TIMOPTIC-XR) 0.25 % ophthalmic gel-forming Place 1 drop into both eyes in the morning and at bedtime.   No current facility-administered medications for this visit. (Ophthalmic Drugs)   Current Outpatient Medications (Other)  Medication Sig   allopurinol (ZYLOPRIM) 100 MG tablet TAKE 1 TABLET EVERY DAY   amLODipine (NORVASC) 10 MG tablet TAKE 1 TABLET EVERY DAY   aspirin 81 MG tablet Take 81 mg by mouth daily.   atorvastatin (LIPITOR) 40 MG tablet Take 1 tablet (40 mg total) by mouth daily.   doxazosin (CARDURA) 2 MG tablet Take 1 tablet (2 mg total) by mouth daily.   metoprolol tartrate (LOPRESSOR) 100 MG tablet TAKE 1 TABLET TWICE DAILY   sildenafil (REVATIO) 20 MG tablet Take 5 tablets (100 mg total) by mouth daily as needed.   No current facility-administered medications for this visit. (Other)      REVIEW OF  SYSTEMS:    ALLERGIES Allergies  Allergen Reactions   Hctz [Hydrochlorothiazide] Other (See Comments)    Would avoid due to h/o gout.     Septra [Sulfamethoxazole-Trimethoprim]     diarrhea   Spironolactone Other (See Comments)    Possible cause of aches.     Valsartan Other (See Comments)    Possible cause of aches.      PAST MEDICAL HISTORY Past Medical History:  Diagnosis Date   CAD (coronary artery disease) 07/17/2004   Diverticulosis of colon 12/15/2005   Esophageal stricture    Gastritis    GERD (gastroesophageal reflux disease) 09/16/1996   Glaucoma    Heart disease    History of tobacco abuse    Quit 2000   Hx of cardiovascular stress test    ETT-Myoview (01/2014):  1.5 mm ST depression in inf leads at peak exercise; no ischemia or scar, EF 57%, Low Risk   Hyperlipidemia 03/17/1999   Hypertension 03/17/1999   Stress fracture of foot    right midfoot, per Dr. Canary Brim 2012   Past Surgical History:  Procedure Laterality Date   CATARACT EXTRACTION, BILATERAL     COLONOSCOPY  12/30/2005   Divertics, mild, 10 yrs   COLONOSCOPY  11/26/2006  Colitis biopsy neg   CORONARY ARTERY BYPASS GRAFT  07/27/2004   By Dr. Roxy Manns with a LIMA to the LAD, RIMA to the distal right coronary artery, spahenous vein graft first diagonal, saphenous vein graft to the first circumflex marginal, sequential saphenous vein graft to the second   ESOPHAGOGASTRODUODENOSCOPY  10/08/2000   Stricture distal esoph, repeat dilation done 2012   ESOPHAGOGASTRODUODENOSCOPY  10/08/2000   Stricture distal esoph   I & D EXTREMITY Left 01/24/2017   Procedure: MINOR INCISION AND DRAINAGE LEFT THUMB;  Surgeon: Leanora Cover, MD;  Location: [facility];  Service: Orthopedics;  Laterality: Left;    FAMILY HISTORY Family History  Problem Relation Age of Onset   Peripheral vascular disease Mother    Stroke Father        x2   Hypertension Brother    Heart attack Brother        MI   Colon polyps  Brother    Heart disease Brother        MI   Hypertension Sister    Breast cancer Sister    Hypertension Sister    Hypertension Sister    Colon polyps Other    Diabetes Neg Hx    Depression Neg Hx    Alcohol abuse Neg Hx    Drug abuse Neg Hx    Prostate cancer Neg Hx    Colon cancer Neg Hx     SOCIAL HISTORY Social History   Tobacco Use   Smoking status: Former    Types: Cigarettes    Quit date: 09/16/1998    Years since quitting: 23.0   Smokeless tobacco: Never  Vaping Use   Vaping Use: Never used  Substance Use Topics   Alcohol use: Yes    Comment: occasional beer   Drug use: No         OPHTHALMIC EXAM:  Base Eye Exam     Visual Acuity (ETDRS)       Right Left   Dist [place] 20/30 20/25   Dist ph [place] 20/20 -1     Correction: Glasses         Tonometry (Tonopen, 8:41 AM)       Right Left   Pressure 16 18         Pupils       Pupils Shape React APD   Right PERRL Round Brisk None   Left PERRL Round Brisk None         Visual Fields (Counting fingers)       Left Right    Full Full         Extraocular Movement       Right Left    Full, Ortho Full, Ortho         Neuro/Psych     Oriented x3: Yes   Mood/Affect: Normal         Dilation     Left eye: 1.0% Mydriacyl, 2.5% Phenylephrine @ 8:41 AM           Slit Lamp and Fundus Exam     External Exam       Right Left   External Normal Normal         Slit Lamp Exam       Right Left   Lids/Lashes Normal Normal   Conjunctiva/Sclera White and quiet White and quiet   Cornea Clear Clear   Anterior Chamber Deep and quiet Deep and quiet   Iris Round and reactive Round and  reactive   Lens Centered posterior chamber intraocular lens Centered posterior chamber intraocular lens   Anterior Vitreous Normal Normal         Fundus Exam       Right Left   Posterior Vitreous  Normal   Disc  Peripapillary CNVM temporal aspect of the nerve with subretinal hemorrhage   C/D Ratio   0.05   Macula  Normal   Vessels  Normal   Periphery  Normal            IMAGING AND PROCEDURES  Imaging and Procedures for 09/24/21  OCT, Retina - OU - Both Eyes       Right Eye Quality was good. Scan locations included subfoveal. Central Foveal Thickness: 250. Progression has improved. Findings include vitreomacular adhesion , abnormal foveal contour, subretinal hyper-reflective material, subretinal fluid.   Left Eye Quality was good. Scan locations included subfoveal. Central Foveal Thickness: 253. Progression has improved. Findings include vitreomacular adhesion , abnormal foveal contour, subretinal hyper-reflective material, subretinal fluid.   Notes OD, much less active, temp, peripapillary, repeat injection today Avastin to control condition currently at 6 days after recent injection OD compared to onset Sept 2022.  OS vastly improved macular lesion along the p.m. bundle and peripapillary, 10 weeks after recent injection Avastin,, and 4 months after onset of therapy repeat OS  examination as scheduled     Intravitreal Injection, Pharmacologic Agent - OS - Left Eye       Time Out 09/24/2021. 9:03 AM. Confirmed correct patient, procedure, site, and patient consented.   Anesthesia Topical anesthesia was used. Anesthetic medications included Lidocaine 4%.   Procedure Preparation included 10% betadine to eyelids, 5% betadine to ocular surface, Ofloxacin . A 30 gauge needle was used.   Injection: 2.5 mg bevacizumab 2.5 MG/0.1ML   Route: Intravitreal, Site: Left Eye   NDC: [phone], Lot: [id number]   Post-op Post injection exam found visual acuity of at least counting fingers. The patient tolerated the procedure well. There were no complications. The patient received written and verbal post procedure care education. Post injection medications included ocuflox.              ASSESSMENT/PLAN:  Exudative age-related macular degeneration of right eye with active  choroidal neovascularization (HCC) 6 days after recent injection, doing well  Exudative age-related macular degeneration of left eye with active choroidal neovascularization ([place]) Today at 10 weeks post most recent injection left eye, no signs of active disease repeat injection today and repeat again evaluation 11 weeks     ICD-10-CM   1. Exudative age-related macular degeneration of left eye with active choroidal neovascularization (HCC)  H35.3221 OCT, Retina - OU - Both Eyes    Intravitreal Injection, Pharmacologic Agent - OS - Left Eye    bevacizumab (AVASTIN) SOSY 2.5 mg    2. Exudative age-related macular degeneration of right eye with active choroidal neovascularization ([place])  H35.3211       1.  OS vastly improved overall and stable no signs of recurrence post Avastin usage left eye intravitreal some 10 weeks previous.  We will repeat injection today and extend interval examination to 11 weeks OS next  2.  Follow-up OD as scheduled  3.  Ophthalmic Meds Ordered this visit:  Meds ordered this encounter  Medications   bevacizumab (AVASTIN) SOSY 2.5 mg       Return in about 11 weeks (around 12/10/2021) for dilate, OS, AVASTIN OCT,, and follow-up OD as scheduled.  There are no Patient Instructions  on file for this visit.   Explained the diagnoses, plan, and follow up with the patient and they expressed understanding.  Patient expressed understanding of the importance of proper follow up care.   Clent Demark Masami Plata M.D. Diseases & Surgery of the Retina and Vitreous [facility] [employer] 09/24/21     Abbreviations: M myopia (nearsighted); A astigmatism; H hyperopia (farsighted); P presbyopia; Mrx spectacle prescription;  CTL contact lenses; OD right eye; OS left eye; OU both eyes  XT exotropia; ET esotropia; PEK punctate epithelial keratitis; PEE punctate epithelial erosions; DES dry eye syndrome; MGD meibomian gland dysfunction; ATs artificial tears; PFAT's preservative  free artificial tears; [place] nuclear sclerotic cataract; PSC posterior subcapsular cataract; ERM epi-retinal membrane; PVD posterior vitreous detachment; RD retinal detachment; DM diabetes mellitus; DR diabetic retinopathy; NPDR non-proliferative diabetic retinopathy; PDR proliferative diabetic retinopathy; CSME clinically significant macular edema; DME diabetic macular edema; dbh dot blot hemorrhages; CWS cotton wool spot; POAG primary open angle glaucoma; C/D cup-to-disc ratio; HVF humphrey visual field; GVF goldmann visual field; OCT optical coherence tomography; IOP intraocular pressure; BRVO Branch retinal vein occlusion; CRVO central retinal vein occlusion; CRAO central retinal artery occlusion; BRAO branch retinal artery occlusion; RT retinal tear; SB scleral buckle; PPV pars plana vitrectomy; VH Vitreous hemorrhage; PRP panretinal laser photocoagulation; IVK intravitreal kenalog; VMT vitreomacular traction; MH Macular hole;  NVD neovascularization of the disc; NVE neovascularization elsewhere; AREDS age related eye disease study; ARMD age related macular degeneration; POAG primary open angle glaucoma; EBMD epithelial/anterior basement membrane dystrophy; ACIOL anterior chamber intraocular lens; IOL intraocular lens; PCIOL posterior chamber intraocular lens; Phaco/IOL phacoemulsification with intraocular lens placement; [place] photorefractive keratectomy; LASIK laser assisted in situ keratomileusis; HTN hypertension; DM diabetes mellitus; COPD chronic obstructive pulmonary disease

## 2021-09-25 ENCOUNTER — Other Ambulatory Visit: Payer: Medicare HMO

## 2021-10-01 ENCOUNTER — Ambulatory Visit: Payer: Medicare HMO | Admitting: Family Medicine

## 2021-10-26 DIAGNOSIS — D049 Carcinoma in situ of skin, unspecified: Secondary | ICD-10-CM | POA: Diagnosis not present

## 2021-10-26 DIAGNOSIS — D0439 Carcinoma in situ of skin of other parts of face: Secondary | ICD-10-CM | POA: Diagnosis not present

## 2021-10-26 DIAGNOSIS — Z481 Encounter for planned postprocedural wound closure: Secondary | ICD-10-CM | POA: Diagnosis not present

## 2021-11-21 ENCOUNTER — Encounter (INDEPENDENT_AMBULATORY_CARE_PROVIDER_SITE_OTHER): Payer: Self-pay | Admitting: Ophthalmology

## 2021-11-21 ENCOUNTER — Ambulatory Visit (INDEPENDENT_AMBULATORY_CARE_PROVIDER_SITE_OTHER): Payer: Medicare HMO | Admitting: Ophthalmology

## 2021-11-21 ENCOUNTER — Other Ambulatory Visit: Payer: Self-pay

## 2021-11-21 DIAGNOSIS — H353211 Exudative age-related macular degeneration, right eye, with active choroidal neovascularization: Secondary | ICD-10-CM | POA: Diagnosis not present

## 2021-11-21 DIAGNOSIS — H353231 Exudative age-related macular degeneration, bilateral, with active choroidal neovascularization: Secondary | ICD-10-CM | POA: Diagnosis not present

## 2021-11-21 DIAGNOSIS — H353221 Exudative age-related macular degeneration, left eye, with active choroidal neovascularization: Secondary | ICD-10-CM

## 2021-11-21 MED ORDER — BEVACIZUMAB 2.5 MG/0.1ML IZ SOSY
2.5000 mg | PREFILLED_SYRINGE | INTRAVITREAL | Status: AC | PRN
  Administered 2021-11-21: 2.5 mg via INTRAVITREAL

## 2021-11-21 NOTE — Assessment & Plan Note (Signed)
Onset peripapillary CNVM in 2022, improving and stabilizing now, currently at 9-week interval today ? ?Repeat injection today to maintain resolution and extend interval examination next to 11 weeks ?

## 2021-11-21 NOTE — Progress Notes (Signed)
11/21/2021     CHIEF COMPLAINT Patient presents for  Chief Complaint  Patient presents with   Macular Degeneration      HISTORY OF PRESENT ILLNESS: Alan [name] is a 76 y.o. male who presents to the clinic today for:   HPI   9 weeks for dilate, OD AVASTIN OCT. Pt states he is running out of his Optic solution. Pt states" Dr. Kathlen Mody was the one who prescribed it to me but he left and I am running out of the gel form. I was hoping Dr. Zadie Rhine could refer me to another one."  Pt states no vision change since last seen here. No floaterS, FOL.  Last edited by Silvestre Moment on 11/21/2021  7:58 AM.      Referring physician: Tonia Ghent, MD [place],  [place] [id number]  HISTORICAL INFORMATION:   Selected notes from the MEDICAL RECORD NUMBER    Lab Results  Component Value Date   HGBA1C 5.3 09/04/2020     CURRENT MEDICATIONS: Current Outpatient Medications (Ophthalmic Drugs)  Medication Sig   timolol (TIMOPTIC-XR) 0.25 % ophthalmic gel-forming Place 1 drop into both eyes in the morning and at bedtime.   No current facility-administered medications for this visit. (Ophthalmic Drugs)   Current Outpatient Medications (Other)  Medication Sig   allopurinol (ZYLOPRIM) 100 MG tablet TAKE 1 TABLET EVERY DAY   amLODipine (NORVASC) 10 MG tablet TAKE 1 TABLET EVERY DAY   aspirin 81 MG tablet Take 81 mg by mouth daily.   atorvastatin (LIPITOR) 40 MG tablet Take 1 tablet (40 mg total) by mouth daily.   doxazosin (CARDURA) 2 MG tablet Take 1 tablet (2 mg total) by mouth daily.   metoprolol tartrate (LOPRESSOR) 100 MG tablet TAKE 1 TABLET TWICE DAILY   sildenafil (REVATIO) 20 MG tablet Take 5 tablets (100 mg total) by mouth daily as needed.   No current facility-administered medications for this visit. (Other)      REVIEW OF SYSTEMS: ROS   Negative for: Constitutional, Gastrointestinal, Neurological, Skin, Genitourinary, Musculoskeletal, HENT, Endocrine,  Cardiovascular, Eyes, Respiratory, Psychiatric, Allergic/Imm, Heme/Lymph Last edited by Silvestre Moment on 11/21/2021  7:58 AM.       ALLERGIES Allergies  Allergen Reactions   Hctz [Hydrochlorothiazide] Other (See Comments)    Would avoid due to h/o gout.     Septra [Sulfamethoxazole-Trimethoprim]     diarrhea   Spironolactone Other (See Comments)    Possible cause of aches.     Valsartan Other (See Comments)    Possible cause of aches.      PAST MEDICAL HISTORY Past Medical History:  Diagnosis Date   CAD (coronary artery disease) 07/17/2004   Diverticulosis of colon 12/15/2005   Esophageal stricture    Gastritis    GERD (gastroesophageal reflux disease) 09/16/1996   Glaucoma    Heart disease    History of tobacco abuse    Quit 2000   Hx of cardiovascular stress test    ETT-Myoview (01/2014):  1.5 mm ST depression in inf leads at peak exercise; no ischemia or scar, EF 57%, Low Risk   Hyperlipidemia 03/17/1999   Hypertension 03/17/1999   Stress fracture of foot    right midfoot, per Dr. Canary Brim 2012   Past Surgical History:  Procedure Laterality Date   CATARACT EXTRACTION, BILATERAL     COLONOSCOPY  12/30/2005   Divertics, mild, 10 yrs   COLONOSCOPY  11/26/2006   Colitis biopsy neg   CORONARY ARTERY BYPASS  GRAFT  07/27/2004   By Dr. Roxy Manns with a LIMA to the LAD, RIMA to the distal right coronary artery, spahenous vein graft first diagonal, saphenous vein graft to the first circumflex marginal, sequential saphenous vein graft to the second   ESOPHAGOGASTRODUODENOSCOPY  10/08/2000   Stricture distal esoph, repeat dilation done 2012   ESOPHAGOGASTRODUODENOSCOPY  10/08/2000   Stricture distal esoph   I & D EXTREMITY Left 01/24/2017   Procedure: MINOR INCISION AND DRAINAGE LEFT THUMB;  Surgeon: Leanora Cover, MD;  Location: [facility];  Service: Orthopedics;  Laterality: Left;    FAMILY HISTORY Family History  Problem Relation Age of Onset   Peripheral vascular disease  Mother    Stroke Father        x2   Hypertension Brother    Heart attack Brother        MI   Colon polyps Brother    Heart disease Brother        MI   Hypertension Sister    Breast cancer Sister    Hypertension Sister    Hypertension Sister    Colon polyps Other    Diabetes Neg Hx    Depression Neg Hx    Alcohol abuse Neg Hx    Drug abuse Neg Hx    Prostate cancer Neg Hx    Colon cancer Neg Hx     SOCIAL HISTORY Social History   Tobacco Use   Smoking status: Former    Types: Cigarettes    Quit date: 09/16/1998    Years since quitting: 23.1   Smokeless tobacco: Never  Vaping Use   Vaping Use: Never used  Substance Use Topics   Alcohol use: Yes    Comment: occasional beer   Drug use: No         OPHTHALMIC EXAM:  Base Eye Exam     Visual Acuity (ETDRS)       Right Left   Dist [place] 20/25 -2 20/25 -2         Tonometry (Tonopen, 8:04 AM)       Right Left   Pressure 12 14         Pupils       Pupils Dark Light Shape React APD   Right PERRL 3 2 Round Slow None   Left PERRL 3 2 Round Slow None         Visual Fields       Left Right    Full          Extraocular Movement       Right Left    Full Full         Neuro/Psych     Oriented x3: Yes   Mood/Affect: Normal         Dilation     Right eye: 1.0% Mydriacyl, 2.5% Phenylephrine @ 8:04 AM           Slit Lamp and Fundus Exam     External Exam       Right Left   External Normal Normal         Slit Lamp Exam       Right Left   Lids/Lashes Normal Normal   Conjunctiva/Sclera White and quiet White and quiet   Cornea Clear Clear   Anterior Chamber Deep and quiet Deep and quiet   Iris Round and reactive Round and reactive   Lens Centered posterior chamber intraocular lens Centered posterior chamber intraocular lens   Anterior  Vitreous Normal Normal         Fundus Exam       Right Left   Posterior Vitreous Normal    Disc Peripapillary CNVM superior pole of the  nerve, with subretinal hemorrhage smaller, and apparent thickening in the p.m. bundle OD    C/D Ratio 0.05 0.05   Macula Normal    Vessels Normal    Periphery Normal             IMAGING AND PROCEDURES  Imaging and Procedures for 11/21/21  OCT, Retina - OU - Both Eyes       Right Eye Central Foveal Thickness: 247.   Left Eye Central Foveal Thickness: 251.   Notes OS today at 8 weeks post injection Avastin looks great with less activity  OD today also 9 weeks post injection Avastin looks great with no peripapillary CNVM activity.  Repeat injection today to maintain resolution and quiescence OD and follow-up next in 11 weeks     Intravitreal Injection, Pharmacologic Agent - OD - Right Eye       Time Out 11/21/2021. 8:44 AM. Confirmed correct patient, procedure, site, and patient consented.   Anesthesia Topical anesthesia was used. Anesthetic medications included Lidocaine 4%.   Procedure Preparation included Tobramycin 0.3%, 10% betadine to eyelids, 5% betadine to ocular surface. A 30 gauge needle was used.   Injection: 2.5 mg bevacizumab 2.5 MG/0.1ML   Route: Intravitreal, Site: Right Eye   NDC: [phone], Lot: [id number]   Post-op Post injection exam found visual acuity of at least counting fingers. The patient tolerated the procedure well. There were no complications. The patient received written and verbal post procedure care education. Post injection medications were not given.              ASSESSMENT/PLAN:  Exudative age-related macular degeneration of right eye with active choroidal neovascularization (HCC) Onset peripapillary CNVM in 2022, improving and stabilizing now, currently at 9-week interval today  Repeat injection today to maintain resolution and extend interval examination next to 11 weeks  Exudative age-related macular degeneration of left eye with active choroidal neovascularization ([place]) Today at 8-week follow-up, follow-up as scheduled      ICD-10-CM   1. Exudative age-related macular degeneration of right eye with active choroidal neovascularization (HCC)  H35.3211 OCT, Retina - OU - Both Eyes    Intravitreal Injection, Pharmacologic Agent - OD - Right Eye    bevacizumab (AVASTIN) SOSY 2.5 mg    2. Exudative age-related macular degeneration of left eye with active choroidal neovascularization ([place])  H35.3221       1.  OD peripapillary CNVM much less active and maintain resolution at 9-week interval follow-up today.  Repeat injection today to maintain and extend interval examination next to 11 weeks OD  2.  OS also stable, follow-up as scheduled in approximately 3 weeks  3.  Ophthalmic Meds Ordered this visit:  Meds ordered this encounter  Medications   bevacizumab (AVASTIN) SOSY 2.5 mg       Return in about 11 weeks (around 02/06/2022) for dilate, OD, AVASTIN OCT.  There are no Patient Instructions on file for this visit.   Explained the diagnoses, plan, and follow up with the patient and they expressed understanding.  Patient expressed understanding of the importance of proper follow up care.   Clent Demark Shaton Lore M.D. Diseases & Surgery of the Retina and Vitreous [facility] 11/21/21     Abbreviations: M myopia (nearsighted); A astigmatism; H hyperopia (  farsighted); P presbyopia; Mrx spectacle prescription;  CTL contact lenses; OD right eye; OS left eye; OU both eyes  XT exotropia; ET esotropia; PEK punctate epithelial keratitis; PEE punctate epithelial erosions; DES dry eye syndrome; MGD meibomian gland dysfunction; ATs artificial tears; PFAT's preservative free artificial tears; Secor nuclear sclerotic cataract; PSC posterior subcapsular cataract; ERM epi-retinal membrane; PVD posterior vitreous detachment; RD retinal detachment; DM diabetes mellitus; DR diabetic retinopathy; NPDR non-proliferative diabetic retinopathy; PDR proliferative diabetic retinopathy; CSME clinically significant macular  edema; DME diabetic macular edema; dbh dot blot hemorrhages; CWS cotton wool spot; POAG primary open angle glaucoma; C/D cup-to-disc ratio; HVF humphrey visual field; GVF goldmann visual field; OCT optical coherence tomography; IOP intraocular pressure; BRVO Branch retinal vein occlusion; CRVO central retinal vein occlusion; CRAO central retinal artery occlusion; BRAO branch retinal artery occlusion; RT retinal tear; SB scleral buckle; PPV pars plana vitrectomy; VH Vitreous hemorrhage; PRP panretinal laser photocoagulation; IVK intravitreal kenalog; VMT vitreomacular traction; MH Macular hole;  NVD neovascularization of the disc; NVE neovascularization elsewhere; AREDS age related eye disease study; ARMD age related macular degeneration; POAG primary open angle glaucoma; EBMD epithelial/anterior basement membrane dystrophy; ACIOL anterior chamber intraocular lens; IOL intraocular lens; PCIOL posterior chamber intraocular lens; Phaco/IOL phacoemulsification with intraocular lens placement; [place] photorefractive keratectomy; LASIK laser assisted in situ keratomileusis; HTN hypertension; DM diabetes mellitus; COPD chronic obstructive pulmonary disease

## 2021-11-21 NOTE — Assessment & Plan Note (Signed)
Today at 8-week follow-up, follow-up as scheduled ?

## 2021-11-26 ENCOUNTER — Other Ambulatory Visit: Payer: Self-pay | Admitting: Family Medicine

## 2021-11-26 DIAGNOSIS — I10 Essential (primary) hypertension: Secondary | ICD-10-CM

## 2021-11-26 DIAGNOSIS — M109 Gout, unspecified: Secondary | ICD-10-CM

## 2021-11-27 ENCOUNTER — Other Ambulatory Visit (INDEPENDENT_AMBULATORY_CARE_PROVIDER_SITE_OTHER): Payer: Medicare HMO

## 2021-11-27 ENCOUNTER — Other Ambulatory Visit: Payer: Self-pay

## 2021-11-27 DIAGNOSIS — M109 Gout, unspecified: Secondary | ICD-10-CM | POA: Diagnosis not present

## 2021-11-27 DIAGNOSIS — I10 Essential (primary) hypertension: Secondary | ICD-10-CM | POA: Diagnosis not present

## 2021-11-27 LAB — COMPREHENSIVE METABOLIC PANEL
ALT: 18 U/L (ref 0–53)
AST: 14 U/L (ref 0–37)
Albumin: 4.3 g/dL (ref 3.5–5.2)
Alkaline Phosphatase: 79 U/L (ref 39–117)
BUN: 13 mg/dL (ref 6–23)
CO2: 32 mEq/L (ref 19–32)
Calcium: 9.8 mg/dL (ref 8.4–10.5)
Chloride: 105 mEq/L (ref 96–112)
Creatinine, Ser: 0.88 mg/dL (ref 0.40–1.50)
GFR: 84.15 mL/min (ref >60.00)
Glucose, Bld: 98 mg/dL (ref 70–99)
Potassium: 4 mEq/L (ref 3.5–5.1)
Sodium: 142 mEq/L (ref 135–145)
Total Bilirubin: 0.7 mg/dL (ref 0.2–1.2)
Total Protein: 6.7 g/dL (ref 6.0–8.3)

## 2021-11-27 LAB — LIPID PANEL
Cholesterol: 143 mg/dL (ref 0–200)
HDL: 66.8 mg/dL (ref >39.00)
LDL Cholesterol: 55 mg/dL (ref 0–99)
NonHDL: 76.37
Total CHOL/HDL Ratio: 2
Triglycerides: 106 mg/dL (ref 0.0–149.0)
VLDL: 21.2 mg/dL (ref 0.0–40.0)

## 2021-11-27 LAB — HEMOGLOBIN A1C: Hgb A1c MFr Bld: 5.6 % (ref 4.6–6.5)

## 2021-11-27 LAB — URIC ACID: Uric Acid, Serum: 7.1 mg/dL (ref 4.0–7.8)

## 2021-12-04 ENCOUNTER — Other Ambulatory Visit: Payer: Self-pay

## 2021-12-04 ENCOUNTER — Ambulatory Visit (INDEPENDENT_AMBULATORY_CARE_PROVIDER_SITE_OTHER): Payer: Medicare HMO | Admitting: Family Medicine

## 2021-12-04 ENCOUNTER — Encounter: Payer: Self-pay | Admitting: Family Medicine

## 2021-12-04 VITALS — BP 130/78 | HR 67 | Temp 98.3°F | Ht 69.0 in | Wt 198.0 lb

## 2021-12-04 DIAGNOSIS — M109 Gout, unspecified: Secondary | ICD-10-CM

## 2021-12-04 DIAGNOSIS — N529 Male erectile dysfunction, unspecified: Secondary | ICD-10-CM

## 2021-12-04 DIAGNOSIS — Z0000 Encounter for general adult medical examination without abnormal findings: Secondary | ICD-10-CM

## 2021-12-04 DIAGNOSIS — Z7189 Other specified counseling: Secondary | ICD-10-CM

## 2021-12-04 DIAGNOSIS — M79606 Pain in leg, unspecified: Secondary | ICD-10-CM | POA: Diagnosis not present

## 2021-12-04 DIAGNOSIS — E785 Hyperlipidemia, unspecified: Secondary | ICD-10-CM | POA: Diagnosis not present

## 2021-12-04 DIAGNOSIS — I10 Essential (primary) hypertension: Secondary | ICD-10-CM | POA: Diagnosis not present

## 2021-12-04 MED ORDER — ATORVASTATIN CALCIUM 40 MG PO TABS: 40.0000 mg | ORAL_TABLET | Freq: Every day | ORAL | 3 refills | Status: DC

## 2021-12-04 MED ORDER — SILDENAFIL CITRATE 20 MG PO TABS: 100.0000 mg | ORAL_TABLET | Freq: Every day | ORAL | 12 refills | Status: DC | PRN

## 2021-12-04 NOTE — Progress Notes (Signed)
This visit occurred during the SARS-CoV-2 public health emergency.  Safety protocols were in place, including screening questions prior to the visit, additional usage of staff PPE, and extensive cleaning of exam room while observing appropriate contact time as indicated for disinfecting solutions. ? ?I have personally reviewed the Medicare Annual Wellness questionnaire and have noted ?1. The patient's medical and social history ?2. Their use of alcohol, tobacco or illicit drugs ?3. Their current medications and supplements ?4. The patient's functional ability including ADL's, fall risks, home safety risks and hearing or visual ?            impairment. ?5. Diet and physical activities ?6. Evidence for depression or mood disorders ? ?The patients weight, height, BMI have been recorded in the chart and visual acuity is per eye clinic.  ?I have made referrals, counseling and provided education to the patient based review of the above and I have provided the pt with a written personalized care plan for preventive services. ? ?Provider list updated- see scanned forms.  Routine anticipatory guidance given to patient.  See health maintenance. The possibility exists that previously documented standard health maintenance information may have been brought forward from a previous encounter into this note.  If needed, that same information has been updated to reflect the current situation based on today's encounter.   ? ?Flu 2022 ?Shingles discussed with patient.  Zostavax previously done. ?PNA up-to-date ?Tetanus 2019 ?COVID-vaccine previously done.   ?Colonoscopy 2018 ?Prostate cancer screening-deferred 2023.  Discussed with patient.  He agreed. ?Advance directive-updated 2023.  Son and daughter are equally designated to patient when capacitated. ?Cognitive function addressed- see scanned forms- and if abnormal then additional documentation follows.  ? ?In addition to [facility] Wellness, follow up visit for the below  conditions: ? ?Gout. No sx. On allopurinol.  No ADE on med.  Compliant. ? ?Hypertension:    ?Using medication without problems or lightheadedness: yes ?Chest pain with exertion:no ?Edema:no ?Short of breath:no ? ?Elevated Cholesterol: ?Using medications without problems: yes ?Muscle aches: not from statin.  ?Diet compliance: d/w pt.   ?Exercise: d/w pt.   ?Labs d/w pt.   ? ?ED improved with sildenafil.  No ADE on med.  No NTG use.  Routine cautions d/w pt.   ? ?Leg aches.  He had vascular eval.  Not thought to be arterial given normal ABIs.  Aches got better gradually. No back pain.  No sx now.  ? ?IMPRESSION: ?1. Mild lumbar degenerative disc disease with mild spinal canal ?stenosis at L3-4. ?2. Bilateral lateral recess narrowing at L2-3 secondary to ?combination of disc bulging and facet arthrosis. Correlate for L3 ?radiculopathy. ? ?Unclear if any of his back findings contributed to prev sx.  He is clearly better now and his sx aren't attributable to current meds- no sx on current meds.   ? ?PMH and SH reviewed ? ?Meds, vitals, and allergies reviewed.  ? ?ROS: Per HPI.  Unless specifically indicated otherwise in HPI, the patient denies: ? ?General: fever. ?Eyes: acute vision changes ?ENT: sore throat ?Cardiovascular: chest pain ?Respiratory: SOB ?GI: vomiting ?GU: dysuria ?Musculoskeletal: acute back pain ?Derm: acute rash ?Neuro: acute motor dysfunction ?Psych: worsening mood ?Endocrine: polydipsia ?Heme: bleeding ?Allergy: hayfever ? ?GEN: nad, alert and oriented ?HEENT: ncat ?NECK: supple w/o LA ?CV: rrr. ?PULM: ctab, no inc wob ?ABD: soft, +bs ?EXT: no edema ?SKIN: Well-perfused. ?

## 2021-12-04 NOTE — Patient Instructions (Signed)
Take care.  Glad to see you. ?Update me as needed.  ?Don't change your meds for now.  ?

## 2021-12-05 NOTE — Assessment & Plan Note (Signed)
Flu 2022 ?Shingles discussed with patient.  Zostavax previously done. ?PNA up-to-date ?Tetanus 2019 ?COVID-vaccine previously done.   ?Colonoscopy 2018 ?Prostate cancer screening-deferred 2023.  Discussed with patient.  He agreed. ?Advance directive-updated 2023.  Son and daughter are equally designated to patient when capacitated. ?Cognitive function addressed- see scanned forms- and if abnormal then additional documentation follows.  ?

## 2021-12-05 NOTE — Assessment & Plan Note (Signed)
Continue atorvastatin

## 2021-12-05 NOTE — Assessment & Plan Note (Signed)
He had vascular eval.  Not thought to be arterial given normal ABIs.  Aches got better gradually. No back pain.  No sx now.  ? ?IMPRESSION: ?1. Mild lumbar degenerative disc disease with mild spinal canal ?stenosis at L3-4. ?2. Bilateral lateral recess narrowing at L2-3 secondary to ?combination of disc bulging and facet arthrosis. Correlate for L3 ?radiculopathy. ? ?Unclear if any of his back findings contributed to prev sx.  He is clearly better now and his sx aren't attributable to current meds- no sx on current meds.   Since he is not having symptoms right now we agreed it was reasonable to observe only and he will update me as needed. ?

## 2021-12-05 NOTE — Assessment & Plan Note (Signed)
-   Continue sildenafil °

## 2021-12-05 NOTE — Assessment & Plan Note (Signed)
No symptoms.  Continue allopurinol. ?

## 2021-12-05 NOTE — Assessment & Plan Note (Signed)
Continue amlodipine doxazosin and metoprolol. ?

## 2021-12-05 NOTE — Assessment & Plan Note (Signed)
Advance directive-updated 2023.  Son and daughter are equally designated if patient were incapacitated. ?

## 2021-12-10 ENCOUNTER — Other Ambulatory Visit: Payer: Self-pay

## 2021-12-10 ENCOUNTER — Encounter (INDEPENDENT_AMBULATORY_CARE_PROVIDER_SITE_OTHER): Payer: Self-pay | Admitting: Ophthalmology

## 2021-12-10 ENCOUNTER — Ambulatory Visit (INDEPENDENT_AMBULATORY_CARE_PROVIDER_SITE_OTHER): Payer: Medicare HMO | Admitting: Ophthalmology

## 2021-12-10 DIAGNOSIS — H353231 Exudative age-related macular degeneration, bilateral, with active choroidal neovascularization: Secondary | ICD-10-CM

## 2021-12-10 DIAGNOSIS — H353221 Exudative age-related macular degeneration, left eye, with active choroidal neovascularization: Secondary | ICD-10-CM

## 2021-12-10 DIAGNOSIS — H353211 Exudative age-related macular degeneration, right eye, with active choroidal neovascularization: Secondary | ICD-10-CM | POA: Diagnosis not present

## 2021-12-10 MED ORDER — BEVACIZUMAB 2.5 MG/0.1ML IZ SOSY
2.5000 mg | PREFILLED_SYRINGE | INTRAVITREAL | Status: AC | PRN
  Administered 2021-12-10: 2.5 mg via INTRAVITREAL

## 2021-12-10 NOTE — Assessment & Plan Note (Signed)
Today some 3 weeks post most recent injection OD, doing well much less activity by OCT.  Repeat evaluation OD as scheduled ?

## 2021-12-10 NOTE — Progress Notes (Signed)
? ? ?12/10/2021 ? ?  ? ?CHIEF COMPLAINT ?Patient presents for No chief complaint on file. ? ? ? ? ?HISTORY OF PRESENT ILLNESS: ?Alan [name] is a 76 y.o. male who presents to the clinic today for:  ? ?HPI   ?11 weeks dilate OS, Avastin OS, OCT. ?Patient states vision is stable and unchanged since last visit. Denies any new floaters or FOL. ?Pt is using dorz-tim BID OU, pt states he needs a refill but Dr. Kathlen Mody is no longer with the practice anymore. ?Last edited by Laurin Coder on 12/10/2021  8:21 AM.  ?  ? ? ?Referring physician: ?Tonia Ghent, MD ?[address] ?[place],  [place] [id number] ? ?HISTORICAL INFORMATION:  ? ?Selected notes from the [facility] ?  ? ?Lab Results  ?Component Value Date  ? HGBA1C 5.6 11/27/2021  ?  ? ?CURRENT MEDICATIONS: ?Current Outpatient Medications (Ophthalmic Drugs)  ?Medication Sig  ? dorzolamide-timolol (COSOPT) 22.3-6.8 MG/ML ophthalmic solution 1 drop 2 (two) times daily.  ? ?No current facility-administered medications for this visit. (Ophthalmic Drugs)  ? ?Current Outpatient Medications (Other)  ?Medication Sig  ? allopurinol (ZYLOPRIM) 100 MG tablet TAKE 1 TABLET EVERY DAY  ? amLODipine (NORVASC) 10 MG tablet TAKE 1 TABLET EVERY DAY  ? aspirin 81 MG tablet Take 81 mg by mouth daily.  ? atorvastatin (LIPITOR) 40 MG tablet Take 1 tablet (40 mg total) by mouth daily.  ? doxazosin (CARDURA) 2 MG tablet Take 1 tablet (2 mg total) by mouth daily.  ? metoprolol tartrate (LOPRESSOR) 100 MG tablet TAKE 1 TABLET TWICE DAILY  ? sildenafil (REVATIO) 20 MG tablet Take 5 tablets (100 mg total) by mouth daily as needed.  ? ?No current facility-administered medications for this visit. (Other)  ? ? ? ? ?REVIEW OF SYSTEMS: ?ROS   ?Negative for: Constitutional, Gastrointestinal, Neurological, Skin, Genitourinary, Musculoskeletal, HENT, Endocrine, Cardiovascular, Eyes, Respiratory, Psychiatric, Allergic/Imm, Heme/Lymph ?Last edited by Hurman Horn, MD on 12/10/2021  9:00  AM.  ?  ? ? ? ?ALLERGIES ?Allergies  ?Allergen Reactions  ? Hctz [Hydrochlorothiazide] Other (See Comments)  ?  Would avoid due to h/o gout.    ? Septra [Sulfamethoxazole-Trimethoprim]   ?  diarrhea  ? Spironolactone Other (See Comments)  ?  Possible cause of aches.    ? Valsartan Other (See Comments)  ?  Possible cause of aches.    ? ? ?PAST MEDICAL HISTORY ?Past Medical History:  ?Diagnosis Date  ? CAD (coronary artery disease) 07/17/2004  ? Diverticulosis of colon 12/15/2005  ? Esophageal stricture   ? Gastritis   ? GERD (gastroesophageal reflux disease) 09/16/1996  ? Glaucoma   ? Heart disease   ? History of tobacco abuse   ? Quit 2000  ? Hx of cardiovascular stress test   ? ETT-Myoview (01/2014):  1.5 mm ST depression in inf leads at peak exercise; no ischemia or scar, EF 57%, Low Risk  ? Hyperlipidemia 03/17/1999  ? Hypertension 03/17/1999  ? Stress fracture of foot   ? right midfoot, per Dr. Canary Brim 2012  ? ?Past Surgical History:  ?Procedure Laterality Date  ? CATARACT EXTRACTION, BILATERAL    ? COLONOSCOPY  12/30/2005  ? Divertics, mild, 10 yrs  ? COLONOSCOPY  11/26/2006  ? Colitis biopsy neg  ? CORONARY ARTERY BYPASS GRAFT  07/27/2004  ? By Dr. Roxy Manns with a LIMA to the LAD, RIMA to the distal right coronary artery, spahenous vein graft first diagonal, saphenous vein graft to the  first circumflex marginal, sequential saphenous vein graft to the second  ? ESOPHAGOGASTRODUODENOSCOPY  10/08/2000  ? Stricture distal esoph, repeat dilation done 2012  ? ESOPHAGOGASTRODUODENOSCOPY  10/08/2000  ? Stricture distal esoph  ? I & D EXTREMITY Left 01/24/2017  ? Procedure: MINOR INCISION AND DRAINAGE LEFT THUMB;  Surgeon: Leanora Cover, MD;  Location: [facility];  Service: Orthopedics;  Laterality: Left;  ? ? ?FAMILY HISTORY ?Family History  ?Problem Relation Age of Onset  ? Peripheral vascular disease Mother   ? Stroke Father   ?     x2  ? Hypertension Brother   ? Heart attack Brother   ?     MI  ? Colon polyps Brother    ? Heart disease Brother   ?     MI  ? Hypertension Sister   ? Breast cancer Sister   ? Hypertension Sister   ? Hypertension Sister   ? Colon polyps Other   ? Diabetes Neg Hx   ? Depression Neg Hx   ? Alcohol abuse Neg Hx   ? Drug abuse Neg Hx   ? Prostate cancer Neg Hx   ? Colon cancer Neg Hx   ? ? ?SOCIAL HISTORY ?Social History  ? ?Tobacco Use  ? Smoking status: Former  ?  Types: Cigarettes  ?  Quit date: 09/16/1998  ?  Years since quitting: 23.2  ? Smokeless tobacco: Never  ?Vaping Use  ? Vaping Use: Never used  ?Substance Use Topics  ? Alcohol use: Yes  ?  Comment: occasional beer  ? Drug use: No  ? ?  ? ?  ? ?OPHTHALMIC EXAM: ? ?Base Eye Exam   ? ? Visual Acuity (ETDRS)   ? ?   Right Left  ? Dist  20/20 20/25 -1  ? ?  ?  ? ? Tonometry (Tonopen, 8:24 AM)   ? ?   Right Left  ? Pressure 18 15  ? ?  ?  ? ? Pupils   ? ?   Pupils Dark Light APD  ? Right PERRL 3 2 None  ? Left PERRL 3 2 None  ? ?  ?  ? ? Extraocular Movement   ? ?   Right Left  ?  Full Full  ? ?  ?  ? ? Neuro/Psych   ? ? Oriented x3: Yes  ? Mood/Affect: Normal  ? ?  ?  ? ? Dilation   ? ? Left eye: 1.0% Mydriacyl, 2.5% Phenylephrine @ 8:23 AM  ? ?  ?  ? ?  ? ?Slit Lamp and Fundus Exam   ? ? External Exam   ? ?   Right Left  ? External Normal Normal  ? ?  ?  ? ? Slit Lamp Exam   ? ?   Right Left  ? Lids/Lashes Normal Normal  ? Conjunctiva/Sclera White and quiet White and quiet  ? Cornea Clear Clear  ? Anterior Chamber Deep and quiet Deep and quiet  ? Iris Round and reactive Round and reactive  ? Lens Centered posterior chamber intraocular lens Centered posterior chamber intraocular lens  ? Anterior Vitreous Normal Normal  ? ?  ?  ? ? Fundus Exam   ? ?   Right Left  ? Posterior Vitreous  Normal  ? 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 C/D Ratio  0.05  ? Macula  Normal  ? Vessels  Normal  ? Periphery  Normal  ? ?  ?  ? ?  ? ? ?IMAGING AND PROCEDURES  ?Imaging and Procedures for 12/10/21 ? ?Intravitreal  Injection, Pharmacologic Agent - OS - Left Eye   ? ?   ?Time Out ?12/10/2021. 9:01 AM. Confirmed correct patient, procedure, site, and patient consented.  ? ?Anesthesia ?Topical anesthesia was used. Anesthetic medications included Lidocaine 4%.  ? ?Procedure ?Preparation included 10% betadine to eyelids, 5% betadine to ocular surface, Ofloxacin . A 30 gauge needle was used.  ? ?Injection: ?2.5 mg bevacizumab 2.5 MG/0.1ML ?  Route: Intravitreal, Site: Left Eye ?  [place]: 23536-144-31, Lot: [id number]  ? ?Post-op ?Post injection exam found visual acuity of at least counting fingers. The patient tolerated the procedure well. There were no complications. The patient received written and verbal post procedure care education. Post injection medications included ocuflox.  ? ?  ? ?OCT, Retina - OU - Both Eyes   ? ?   ?Right Eye ?Quality was good. Scan locations included subfoveal. Central Foveal Thickness: 247. Progression has improved. Findings include vitreomacular adhesion .  ? ?Left Eye ?Quality was good. Scan locations included subfoveal. Central Foveal Thickness: 251. Progression has improved.  ? ?Notes ?OS today at 11 weeks post injection Avastin looks great with less activity then we will repeat injection OS as scheduled ? ?OD today also 2 weeks post injection Avastin looks great with no peripapillary CNVM activity.   ? ?  ? ? ?  ?  ? ?  ?ASSESSMENT/PLAN: ? ?Exudative age-related macular degeneration of right eye with active choroidal neovascularization ([place]) ?Today some 3 weeks post most recent injection OD, doing well much less activity by OCT.  Repeat evaluation OD as scheduled ? 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 ?  ICD-10-CM   ?1. Exudative age-related macular degeneration of left eye with  active choroidal neovascularization (HCC)  H35.3221 Intravitreal Injection, Pharmacologic Agent - OS - Left Eye  ?  OCT, Retina - OU - Both Eyes  ?  bevacizumab (AVASTIN) SOSY 2.5 mg  ?  ?2. Exudative age-re

## 2021-12-10 NOTE — Assessment & Plan Note (Signed)
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

## 2021-12-20 ENCOUNTER — Other Ambulatory Visit: Payer: Self-pay

## 2021-12-20 MED ORDER — DOXAZOSIN MESYLATE 2 MG PO TABS: 2.0000 mg | ORAL_TABLET | Freq: Every day | ORAL | 3 refills | Status: DC

## 2021-12-30 NOTE — Progress Notes (Signed)
?  ?Cardiology Office Note ? ? ?Date:  12/31/2021  ? ?ID:  TYMOTHY [name], DOB June 26, 1946, MRN [id number] ? ?PCP:  Tonia Ghent, MD  ?Cardiologist:   Minus Breeding, MD ? ? ?Chief Complaint  ?Patient presents with  ? Coronary Artery Disease  ? ? ?  ?History of Present Illness: ?Alan [name] is a 76 y.o. male who presents for follow of CAD status post CABG.  Since I last saw him he has done well.  He still cares for his wife who has to walk with a walker after she had some nerve injury with a femur fracture.  He does have a stationary bike in his house but has not been using it.  He is doing a lot of chores. The patient denies any new symptoms such as chest discomfort, neck or arm discomfort. There has been no new shortness of breath, PND or orthopnea. There have been no reported palpitations, presyncope or syncope.  ? ? ?Past Medical History:  ?Diagnosis Date  ? CAD (coronary artery disease) 07/17/2004  ? Diverticulosis of colon 12/15/2005  ? Esophageal stricture   ? Gastritis   ? GERD (gastroesophageal reflux disease) 09/16/1996  ? Glaucoma   ? Heart disease   ? History of tobacco abuse   ? Quit 2000  ? Hx of cardiovascular stress test   ? ETT-Myoview (01/2014):  1.5 mm ST depression in inf leads at peak exercise; no ischemia or scar, EF 57%, Low Risk  ? Hyperlipidemia 03/17/1999  ? Hypertension 03/17/1999  ? Stress fracture of foot   ? right midfoot, per Dr. Canary Brim 2012  ? ? ?Past Surgical History:  ?Procedure Laterality Date  ? CATARACT EXTRACTION, BILATERAL    ? COLONOSCOPY  12/30/2005  ? Divertics, mild, 10 yrs  ? COLONOSCOPY  11/26/2006  ? Colitis biopsy neg  ? CORONARY ARTERY BYPASS GRAFT  07/27/2004  ? By Dr. Roxy Manns with a LIMA to the LAD, RIMA to the distal right coronary artery, spahenous vein graft first diagonal, saphenous vein graft to the first circumflex marginal, sequential saphenous vein graft to the second  ? ESOPHAGOGASTRODUODENOSCOPY  10/08/2000  ? Stricture distal esoph, repeat dilation done 2012  ?  ESOPHAGOGASTRODUODENOSCOPY  10/08/2000  ? Stricture distal esoph  ? I & D EXTREMITY Left 01/24/2017  ? Procedure: MINOR INCISION AND DRAINAGE LEFT THUMB;  Surgeon: Leanora Cover, MD;  Location: [facility];  Service: Orthopedics;  Laterality: Left;  ? ? ? ?Current Outpatient Medications  ?Medication Sig Dispense Refill  ? allopurinol (ZYLOPRIM) 100 MG tablet TAKE 1 TABLET EVERY DAY 90 tablet 3  ? amLODipine (NORVASC) 10 MG tablet TAKE 1 TABLET EVERY DAY 90 tablet 3  ? aspirin 81 MG tablet Take 81 mg by mouth daily.    ? atorvastatin (LIPITOR) 40 MG tablet Take 1 tablet (40 mg total) by mouth daily. 90 tablet 3  ? dorzolamide-timolol (COSOPT) 22.3-6.8 MG/ML ophthalmic solution 1 drop 2 (two) times daily.    ? doxazosin (CARDURA) 2 MG tablet Take 1 tablet (2 mg total) by mouth daily. 90 tablet 3  ? metoprolol tartrate (LOPRESSOR) 100 MG tablet TAKE 1 TABLET TWICE DAILY 180 tablet 3  ? sildenafil (REVATIO) 20 MG tablet Take 5 tablets (100 mg total) by mouth daily as needed. 50 tablet 12  ? ?No current facility-administered medications for this visit.  ? ? ?Allergies:   Hctz [hydrochlorothiazide], Septra [sulfamethoxazole-trimethoprim], Spironolactone, and Valsartan  ? ? ?ROS:  Please see the history of  present illness.   Otherwise, review of systems are positive for none.   All other systems are reviewed and negative.  ? ? ?PHYSICAL EXAM: ?VS:  BP (!) 122/56   Pulse (!) 50   Ht '5\' 9"'$  (1.753 m)   Wt 198 lb (89.8 kg)   SpO2 98%   BMI 29.24 kg/m?  , BMI Body mass index is 29.24 kg/m?. ?GENERAL:  Well appearing ?NECK:  No jugular venous distention, waveform within normal limits, carotid upstroke brisk and symmetric, left bruits, no thyromegaly ?LUNGS:  Clear to auscultation bilaterally ?CHEST:  Well healed sternotomy scar. ?HEART:  PMI not displaced or sustained,S1 and S2 within normal limits, no S3, no S4, no clicks, no rubs, no murmurs ?ABD:  Flat, positive bowel sounds normal in frequency in pitch, no  bruits, no rebound, no guarding, no midline pulsatile mass, no hepatomegaly, no splenomegaly ?EXT:  2 plus pulses throughout, no edema, no cyanosis no clubbing ? ? ?EKG:  EKG is  ordered today. ?Sinus bradycardia, rate 50, axis within normal limits, intervals within normal limits, RSR prime V1 and V2 unchanged from previous. ? ?Recent Labs: ?11/27/2021: ALT 18; BUN 13; Creatinine, Ser 0.88; Potassium 4.0; Sodium 142  ? ? ?Lipid Panel ?   ?Component Value Date/Time  ? CHOL 143 11/27/2021 0853  ? CHOL 147 12/08/2017 1048  ? TRIG 106.0 11/27/2021 0853  ? HDL 66.80 11/27/2021 0853  ? HDL 81 12/08/2017 1048  ? CHOLHDL 2 11/27/2021 0853  ? VLDL 21.2 11/27/2021 0853  ? [place] 55 11/27/2021 0853  ? LDLCALC 45 12/08/2017 1048  ? LDLDIRECT 69.6 07/28/2012 0842  ? ?  ? ?Wt Readings from Last 3 Encounters:  ?12/31/21 198 lb (89.8 kg)  ?12/04/21 198 lb (89.8 kg)  ?08/13/21 201 lb (91.2 kg)  ?  ? ? ?Other studies Reviewed: ?Additional studies/ records that were reviewed today include: Labs. ?Review of the above records demonstrates:  Please see elsewhere in the note.   ? ? ?ASSESSMENT AND PLAN: ? ?CAD:    The patient has no new sypmtoms.  No further cardiovascular testing is indicated.  We will continue with aggressive risk reduction and meds as listed.. ? ?HTN:    The blood pressure is at target. No change in medications is indicated. We will continue with therapeutic lifestyle changes (TLC). ? ? ?CAROTID STENOSIS:    This was moderate in Dec 2022.  He will have his again in December.  ? ?CHOLESTEROL:   LDL was 55 with an HDL of 66.  No change in therapy.  ?  ? ?Current medicines are reviewed at length with the patient today.  The patient does not have concerns regarding medicines. ? ?The following changes have been made: None ? ?Labs/ tests ordered today include: None ? ?Orders Placed This Encounter  ?Procedures  ? EKG 12-Lead  ? ? ? ?Disposition:   FU with me in 12 months.   ? ? ?Signed, ?Minus Breeding, MD  ?12/31/2021 11:47  AM    ?[facility] ? ? ?

## 2021-12-31 ENCOUNTER — Ambulatory Visit: Payer: Medicare HMO | Admitting: Cardiology

## 2021-12-31 ENCOUNTER — Encounter: Payer: Self-pay | Admitting: Cardiology

## 2021-12-31 VITALS — BP 122/56 | HR 50 | Ht 69.0 in | Wt 198.0 lb

## 2021-12-31 DIAGNOSIS — I2510 Atherosclerotic heart disease of native coronary artery without angina pectoris: Secondary | ICD-10-CM | POA: Diagnosis not present

## 2021-12-31 DIAGNOSIS — I10 Essential (primary) hypertension: Secondary | ICD-10-CM

## 2021-12-31 DIAGNOSIS — I6523 Occlusion and stenosis of bilateral carotid arteries: Secondary | ICD-10-CM | POA: Diagnosis not present

## 2021-12-31 DIAGNOSIS — E785 Hyperlipidemia, unspecified: Secondary | ICD-10-CM

## 2021-12-31 NOTE — Patient Instructions (Signed)

## 2022-01-08 DIAGNOSIS — C44612 Basal cell carcinoma of skin of right upper limb, including shoulder: Secondary | ICD-10-CM | POA: Diagnosis not present

## 2022-01-28 ENCOUNTER — Ambulatory Visit (INDEPENDENT_AMBULATORY_CARE_PROVIDER_SITE_OTHER): Payer: Medicare HMO | Admitting: Ophthalmology

## 2022-01-28 ENCOUNTER — Encounter (INDEPENDENT_AMBULATORY_CARE_PROVIDER_SITE_OTHER): Payer: Self-pay | Admitting: Ophthalmology

## 2022-01-28 DIAGNOSIS — H353211 Exudative age-related macular degeneration, right eye, with active choroidal neovascularization: Secondary | ICD-10-CM

## 2022-01-28 DIAGNOSIS — H353221 Exudative age-related macular degeneration, left eye, with active choroidal neovascularization: Secondary | ICD-10-CM

## 2022-01-28 DIAGNOSIS — H353231 Exudative age-related macular degeneration, bilateral, with active choroidal neovascularization: Secondary | ICD-10-CM | POA: Diagnosis not present

## 2022-01-28 MED ORDER — BEVACIZUMAB 2.5 MG/0.1ML IZ SOSY
2.5000 mg | PREFILLED_SYRINGE | INTRAVITREAL | Status: AC | PRN
  Administered 2022-01-28: 2.5 mg via INTRAVITREAL

## 2022-01-28 NOTE — Assessment & Plan Note (Signed)
OS today at 7 weeks post examination and treatment of with Avastin for peripapillary CNVM. ?

## 2022-01-28 NOTE — Assessment & Plan Note (Addendum)
OD today at 7-week follow-up interval.  Doing well.  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 ? ?Repeat injection Avastin today at 9-week interval and examination next OD in 8 weeks ?

## 2022-01-28 NOTE — Progress Notes (Signed)
? ? ?01/28/2022 ? ?  ? ?CHIEF COMPLAINT ?Patient presents for  ?Chief Complaint  ?Patient presents with  ? Macular Degeneration  ? ? ? ? ?HISTORY OF PRESENT ILLNESS: ?Alan [name] is a 76 y.o. male who presents to the clinic today for:  ? ?HPI   ?9 weeks for DILATE, OD, AVASTIN OCT. ?Pt denies floaters and FOL. ?Pt stated vision has been stable. ?Pt is taking DORZ-Timolol- 1 drop 2x daily ? ?Last edited by Silvestre Moment on 01/28/2022  8:11 AM.  ?  ? ? ?Referring physician: ?Tonia Ghent, MD ?[address] ?[place],  [place] [id number] ? ?HISTORICAL INFORMATION:  ? ?Selected notes from the [facility] ?  ? ?Lab Results  ?Component Value Date  ? HGBA1C 5.6 11/27/2021  ?  ? ?CURRENT MEDICATIONS: ?Current Outpatient Medications (Ophthalmic Drugs)  ?Medication Sig  ? dorzolamide-timolol (COSOPT) 22.3-6.8 MG/ML ophthalmic solution 1 drop 2 (two) times daily.  ? ?No current facility-administered medications for this visit. (Ophthalmic Drugs)  ? ?Current Outpatient Medications (Other)  ?Medication Sig  ? allopurinol (ZYLOPRIM) 100 MG tablet TAKE 1 TABLET EVERY DAY  ? amLODipine (NORVASC) 10 MG tablet TAKE 1 TABLET EVERY DAY  ? aspirin 81 MG tablet Take 81 mg by mouth daily.  ? atorvastatin (LIPITOR) 40 MG tablet Take 1 tablet (40 mg total) by mouth daily.  ? doxazosin (CARDURA) 2 MG tablet Take 1 tablet (2 mg total) by mouth daily.  ? metoprolol tartrate (LOPRESSOR) 100 MG tablet TAKE 1 TABLET TWICE DAILY  ? sildenafil (REVATIO) 20 MG tablet Take 5 tablets (100 mg total) by mouth daily as needed.  ? ?No current facility-administered medications for this visit. (Other)  ? ? ? ? ?REVIEW OF SYSTEMS: ?ROS   ?Negative for: Constitutional, Gastrointestinal, Neurological, Skin, Genitourinary, Musculoskeletal, HENT, Endocrine, Cardiovascular, Eyes, Respiratory, Psychiatric, Allergic/Imm, Heme/Lymph ?Last edited by Silvestre Moment on 01/28/2022  8:11 AM.  ?  ? ? ? ?ALLERGIES ?Allergies  ?Allergen Reactions  ? Hctz  [Hydrochlorothiazide] Other (See Comments)  ?  Would avoid due to h/o gout.    ? Septra [Sulfamethoxazole-Trimethoprim]   ?  diarrhea  ? Spironolactone Other (See Comments)  ?  Possible cause of aches.    ? Valsartan Other (See Comments)  ?  Possible cause of aches.    ? ? ?PAST MEDICAL HISTORY ?Past Medical History:  ?Diagnosis Date  ? CAD (coronary artery disease) 07/17/2004  ? Diverticulosis of colon 12/15/2005  ? Esophageal stricture   ? Gastritis   ? GERD (gastroesophageal reflux disease) 09/16/1996  ? Glaucoma   ? Heart disease   ? History of tobacco abuse   ? Quit 2000  ? Hx of cardiovascular stress test   ? ETT-Myoview (01/2014):  1.5 mm ST depression in inf leads at peak exercise; no ischemia or scar, EF 57%, Low Risk  ? Hyperlipidemia 03/17/1999  ? Hypertension 03/17/1999  ? Stress fracture of foot   ? right midfoot, per Dr. Canary Brim 2012  ? ?Past Surgical History:  ?Procedure Laterality Date  ? CATARACT EXTRACTION, BILATERAL    ? COLONOSCOPY  12/30/2005  ? Divertics, mild, 10 yrs  ? COLONOSCOPY  11/26/2006  ? Colitis biopsy neg  ? CORONARY ARTERY BYPASS GRAFT  07/27/2004  ? By Dr. Roxy Manns with a LIMA to the LAD, RIMA to the distal right coronary artery, spahenous vein graft first diagonal, saphenous vein graft to the first circumflex marginal, sequential saphenous vein graft to the second  ? ESOPHAGOGASTRODUODENOSCOPY  10/08/2000  ? Stricture distal esoph, repeat dilation done 2012  ? ESOPHAGOGASTRODUODENOSCOPY  10/08/2000  ? Stricture distal esoph  ? I & D EXTREMITY Left 01/24/2017  ? Procedure: MINOR INCISION AND DRAINAGE LEFT THUMB;  Surgeon: Leanora Cover, MD;  Location: [facility];  Service: Orthopedics;  Laterality: Left;  ? ? ?FAMILY HISTORY ?Family History  ?Problem Relation Age of Onset  ? Peripheral vascular disease Mother   ? Stroke Father   ?     x2  ? Hypertension Brother   ? Heart attack Brother   ?     MI  ? Colon polyps Brother   ? Heart disease Brother   ?     MI  ? Hypertension Sister   ?  Breast cancer Sister   ? Hypertension Sister   ? Hypertension Sister   ? Colon polyps Other   ? Diabetes Neg Hx   ? Depression Neg Hx   ? Alcohol abuse Neg Hx   ? Drug abuse Neg Hx   ? Prostate cancer Neg Hx   ? Colon cancer Neg Hx   ? ? ?SOCIAL HISTORY ?Social History  ? ?Tobacco Use  ? Smoking status: Former  ?  Types: Cigarettes  ?  Quit date: 09/16/1998  ?  Years since quitting: 23.3  ? Smokeless tobacco: Never  ?Vaping Use  ? Vaping Use: Never used  ?Substance Use Topics  ? Alcohol use: Yes  ?  Comment: occasional beer  ? Drug use: No  ? ?  ? ?  ? ?OPHTHALMIC EXAM: ? ?Base Eye Exam   ? ? Visual Acuity (ETDRS)   ? ?   Right Left  ? Dist [place] 20/20 -1 20/20 -1  ? ?  ?  ? ? Tonometry (Tonopen, 8:15 AM)   ? ?   Right Left  ? Pressure 15 19  ? ?  ?  ? ? Pupils   ? ?   Pupils APD  ? Right PERRL None  ? Left PERRL None  ? ?  ?  ? ? Visual Fields   ? ?   Left Right  ?  Full Full  ? ?  ?  ? ? Extraocular Movement   ? ?   Right Left  ?  Full, Ortho Full, Ortho  ? ?  ?  ? ? Neuro/Psych   ? ? Oriented x3: Yes  ? Mood/Affect: Normal  ? ?  ?  ? ? Dilation   ? ? Right eye: 2.5% Phenylephrine, 1.0% Mydriacyl @ 8:15 AM  ? ?  ?  ? ?  ? ?Slit Lamp and Fundus Exam   ? ? External Exam   ? ?   Right Left  ? External Normal Normal  ? ?  ?  ? ? Slit Lamp Exam   ? ?   Right Left  ? Lids/Lashes Normal Normal  ? Conjunctiva/Sclera White and quiet White and quiet  ? Cornea Clear Clear  ? Anterior Chamber Deep and quiet Deep and quiet  ? Iris Round and reactive Round and reactive  ? Lens Centered posterior chamber intraocular lens Centered posterior chamber intraocular lens  ? Anterior Vitreous Normal Normal  ? ?  ?  ? ? Fundus Exam   ? ?   Right Left  ? Posterior Vitreous Normal   ? 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 bundle OD   ? C/D Ratio 0.05  0.05  ? Macula Normal   ? Vessels Normal   ? Periphery Normal   ? ?  ?  ? ?  ? ? ?IMAGING AND PROCEDURES   ?Imaging and Procedures for 01/28/22 ? ?OCT, Retina - OU - Both Eyes   ? ?   ?Right Eye ?Quality was good. Scan locations included subfoveal. Central Foveal Thickness: 243. Progression has improved. Findings include vitreomacular adhesion .  ? ?Left Eye ?Quality was good. Scan locations included subfoveal. Central Foveal Thickness: 255. Progression has improved.  ? ?Notes ?OS today at 7 weeks post injection Avastin looks great with less activity then we will repeat evaluation next as scheduled ? ?OD today also 9 weeks post injection Avastin looks great with no peripapillary CNVM activity.,  Nearing 1 year treatment.  Repeat injection today and examination again in 9 weeks ? ?  ? ?Intravitreal Injection, Pharmacologic Agent - OD - Right Eye   ? ?   ?Time Out ?01/28/2022. 9:14 AM. Confirmed correct patient, procedure, site, and patient consented.  ? ?Anesthesia ?Topical anesthesia was used. Anesthetic medications included Lidocaine 4%.  ? ?Procedure ?Preparation included Tobramycin 0.3%, 10% betadine to eyelids, 5% betadine to ocular surface. A 30 gauge needle was used.  ? ?Injection: ?2.5 mg bevacizumab 2.5 MG/0.1ML ?  Route: Intravitreal, Site: Right Eye ?  [place]: [id number], Lot: [id number]  ? ?Post-op ?Post injection exam found visual acuity of at least counting fingers. The patient tolerated the procedure well. There were no complications. The patient received written and verbal post procedure care education. Post injection medications were not given.  ? ?  ? ? ?  ?  ? ?  ?ASSESSMENT/PLAN: ? ?Exudative age-related macular degeneration of right eye with active choroidal neovascularization ([place]) ?OD today at 7-week follow-up interval.  Doing well.  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 ? ?Repeat injection Avastin today at 9-week interval and examination next OD in 8 weeks ? ?Exudative age-related macular degeneration of left eye with active choroidal neovascularization  ([place]) ?OS today at 7 weeks post examination and treatment of with Avastin for peripapillary CNVM.  ? ?  ICD-10-CM   ?1. Exudative age-related macular degeneration of right eye with active choroidal neovascularization (HCC)  H35.32

## 2022-02-06 ENCOUNTER — Encounter (INDEPENDENT_AMBULATORY_CARE_PROVIDER_SITE_OTHER): Payer: Medicare HMO | Admitting: Ophthalmology

## 2022-03-12 ENCOUNTER — Encounter (INDEPENDENT_AMBULATORY_CARE_PROVIDER_SITE_OTHER): Payer: Medicare HMO | Admitting: Ophthalmology

## 2022-04-01 ENCOUNTER — Ambulatory Visit (INDEPENDENT_AMBULATORY_CARE_PROVIDER_SITE_OTHER): Payer: Medicare HMO | Admitting: Ophthalmology

## 2022-04-01 ENCOUNTER — Encounter (INDEPENDENT_AMBULATORY_CARE_PROVIDER_SITE_OTHER): Payer: Self-pay | Admitting: Ophthalmology

## 2022-04-01 DIAGNOSIS — H353211 Exudative age-related macular degeneration, right eye, with active choroidal neovascularization: Secondary | ICD-10-CM

## 2022-04-01 DIAGNOSIS — H353221 Exudative age-related macular degeneration, left eye, with active choroidal neovascularization: Secondary | ICD-10-CM | POA: Diagnosis not present

## 2022-04-01 DIAGNOSIS — H353231 Exudative age-related macular degeneration, bilateral, with active choroidal neovascularization: Secondary | ICD-10-CM

## 2022-04-01 MED ORDER — BEVACIZUMAB 2.5 MG/0.1ML IZ SOSY
2.5000 mg | PREFILLED_SYRINGE | INTRAVITREAL | Status: AC | PRN
  Administered 2022-04-01: 2.5 mg via INTRAVITREAL

## 2022-04-01 NOTE — Assessment & Plan Note (Signed)
Today at 3 months postinjection.  No sign of recurrence.  We will continue to observe

## 2022-04-01 NOTE — Progress Notes (Signed)
04/01/2022     CHIEF COMPLAINT Patient presents for  Chief Complaint  Patient presents with   Macular Degeneration      HISTORY OF PRESENT ILLNESS: Alan [name] is a 76 y.o. male who presents to the clinic today for:   HPI   9 weeks for DILATE OU, AVASTIN, OCT,OD, COLOR FP, OPTOS FFA R/L. Pt stated vision has been stable since last visit. Pt stated he went to the beach last week for a week and forgot to take his eye drops.  Last edited by Silvestre Moment on 04/01/2022 10:52 AM.      Referring physician: Tonia Ghent, MD [place],  [place] [id number]  HISTORICAL INFORMATION:   Selected notes from the MEDICAL RECORD NUMBER    Lab Results  Component Value Date   HGBA1C 5.6 11/27/2021     CURRENT MEDICATIONS: Current Outpatient Medications (Ophthalmic Drugs)  Medication Sig   dorzolamide-timolol (COSOPT) 22.3-6.8 MG/ML ophthalmic solution 1 drop 2 (two) times daily.   No current facility-administered medications for this visit. (Ophthalmic Drugs)   Current Outpatient Medications (Other)  Medication Sig   allopurinol (ZYLOPRIM) 100 MG tablet TAKE 1 TABLET EVERY DAY   amLODipine (NORVASC) 10 MG tablet TAKE 1 TABLET EVERY DAY   aspirin 81 MG tablet Take 81 mg by mouth daily.   atorvastatin (LIPITOR) 40 MG tablet Take 1 tablet (40 mg total) by mouth daily.   doxazosin (CARDURA) 2 MG tablet Take 1 tablet (2 mg total) by mouth daily.   metoprolol tartrate (LOPRESSOR) 100 MG tablet TAKE 1 TABLET TWICE DAILY   sildenafil (REVATIO) 20 MG tablet Take 5 tablets (100 mg total) by mouth daily as needed.   No current facility-administered medications for this visit. (Other)      REVIEW OF SYSTEMS: ROS   Negative for: Constitutional, Gastrointestinal, Neurological, Skin, Genitourinary, Musculoskeletal, HENT, Endocrine, Cardiovascular, Eyes, Respiratory, Psychiatric, Allergic/Imm, Heme/Lymph Last edited by Silvestre Moment on 04/01/2022 10:52 AM.        ALLERGIES Allergies  Allergen Reactions   Hctz [Hydrochlorothiazide] Other (See Comments)    Would avoid due to h/o gout.     Septra [Sulfamethoxazole-Trimethoprim]     diarrhea   Spironolactone Other (See Comments)    Possible cause of aches.     Valsartan Other (See Comments)    Possible cause of aches.      PAST MEDICAL HISTORY Past Medical History:  Diagnosis Date   CAD (coronary artery disease) 07/17/2004   Diverticulosis of colon 12/15/2005   Esophageal stricture    Gastritis    GERD (gastroesophageal reflux disease) 09/16/1996   Glaucoma    Heart disease    History of tobacco abuse    Quit 2000   Hx of cardiovascular stress test    ETT-Myoview (01/2014):  1.5 mm ST depression in inf leads at peak exercise; no ischemia or scar, EF 57%, Low Risk   Hyperlipidemia 03/17/1999   Hypertension 03/17/1999   Stress fracture of foot    right midfoot, per Dr. Canary Brim 2012   Past Surgical History:  Procedure Laterality Date   CATARACT EXTRACTION, BILATERAL     COLONOSCOPY  12/30/2005   Divertics, mild, 10 yrs   COLONOSCOPY  11/26/2006   Colitis biopsy neg   CORONARY ARTERY BYPASS GRAFT  07/27/2004   By Dr. Roxy Manns with a LIMA to the LAD, RIMA to the distal right coronary artery, spahenous vein graft first diagonal, saphenous vein graft to the first circumflex  marginal, sequential saphenous vein graft to the second   ESOPHAGOGASTRODUODENOSCOPY  10/08/2000   Stricture distal esoph, repeat dilation done 2012   ESOPHAGOGASTRODUODENOSCOPY  10/08/2000   Stricture distal esoph   I & D EXTREMITY Left 01/24/2017   Procedure: MINOR INCISION AND DRAINAGE LEFT THUMB;  Surgeon: Leanora Cover, MD;  Location: [facility];  Service: Orthopedics;  Laterality: Left;    FAMILY HISTORY Family History  Problem Relation Age of Onset   Peripheral vascular disease Mother    Stroke Father        x2   Hypertension Brother    Heart attack Brother        MI   Colon polyps Brother     Heart disease Brother        MI   Hypertension Sister    Breast cancer Sister    Hypertension Sister    Hypertension Sister    Colon polyps Other    Diabetes Neg Hx    Depression Neg Hx    Alcohol abuse Neg Hx    Drug abuse Neg Hx    Prostate cancer Neg Hx    Colon cancer Neg Hx     SOCIAL HISTORY Social History   Tobacco Use   Smoking status: Former    Types: Cigarettes    Quit date: 09/16/1998    Years since quitting: 23.5   Smokeless tobacco: Never  Vaping Use   Vaping Use: Never used  Substance Use Topics   Alcohol use: Yes    Comment: occasional beer   Drug use: No         OPHTHALMIC EXAM:  Base Eye Exam     Visual Acuity (ETDRS)       Right Left   Dist [place] 20/30 -1 20/20   Dist ph [place] 20/20          Tonometry (Tonopen, 10:57 AM)       Right Left   Pressure 17 18         Pupils       Pupils APD   Right PERRL None   Left PERRL None         Visual Fields       Left Right    Full Full         Extraocular Movement       Right Left    Full Full         Neuro/Psych     Oriented x3: Yes   Mood/Affect: Normal         Dilation     Both eyes: 1.0% Mydriacyl, 2.5% Phenylephrine @ 10:57 AM           Slit Lamp and Fundus Exam     External Exam       Right Left   External Normal Normal         Slit Lamp Exam       Right Left   Lids/Lashes Normal Normal   Conjunctiva/Sclera White and quiet White and quiet   Cornea Clear Clear   Anterior Chamber Deep and quiet Deep and quiet   Iris Round and reactive Round and reactive   Lens Centered posterior chamber intraocular lens Centered posterior chamber intraocular lens   Anterior Vitreous Normal Normal         Fundus Exam       Right Left   Posterior Vitreous Normal Normal   Disc Peripapillary right scar superior pole of the nerve, with  no subretinal hemorrhage and no apparent thickening in the p.m. bundle OD Peripapillary right scar superior pole of the nerve,  with  no subretinal hemorrhage and no apparent thickening in the p.m. bundle OD   C/D Ratio 0.05 0.05   Macula Normal Normal   Vessels Normal Normal   Periphery Normal Normal            IMAGING AND PROCEDURES  Imaging and Procedures for 04/01/22  OCT, Retina - OU - Both Eyes       Right Eye Quality was good. Scan locations included subfoveal. Central Foveal Thickness: 247. Progression has improved. Findings include vitreomacular adhesion .   Left Eye Quality was good. Scan locations included subfoveal. Central Foveal Thickness: 248. Progression has improved.   Notes OS today at 3 months post injection Avastin looks great with less activity then we will repeat evaluation next as scheduled, continue observe  OD today also 9 weeks post injection Avastin looks great with no peripapillary CNVM activity.,  Nearing 1 year treatment.  Repeat injection today and examination again in 12 weeks     Intravitreal Injection, Pharmacologic Agent - OD - Right Eye       Time Out 04/01/2022. 11:33 AM. Confirmed correct patient, procedure, site, and patient consented.   Anesthesia Topical anesthesia was used. Anesthetic medications included Lidocaine 4%.   Procedure Preparation included 5% betadine to ocular surface, 10% betadine to eyelids, Tobramycin 0.3%. A 30 gauge needle was used.   Injection: 2.5 mg bevacizumab 2.5 MG/0.1ML   Route: Intravitreal, Site: Right Eye   NDC: [id number], Lot: [id number], Expiration date: 05/17/2022   Post-op Post injection exam found visual acuity of at least counting fingers. The patient tolerated the procedure well. There were no complications. The patient received written and verbal post procedure care education. Post injection medications were not given.              ASSESSMENT/PLAN:  Exudative age-related macular degeneration of right eye with active choroidal neovascularization (HCC) OD much improved post injection Avastin current interval  of examination and therapy is 9 weeks.  We will repeat injection today and reevaluate in 3 months  Exudative age-related macular degeneration of left eye with active choroidal neovascularization ([place]) Today at 3 months postinjection.  No sign of recurrence.  We will continue to observe     ICD-10-CM   1. Exudative age-related macular degeneration of right eye with active choroidal neovascularization (HCC)  H35.3211 OCT, Retina - OU - Both Eyes    Intravitreal Injection, Pharmacologic Agent - OD - Right Eye    bevacizumab (AVASTIN) SOSY 2.5 mg    CANCELED: Color Fundus Photography Optos - OU - Both Eyes    CANCELED: Fluorescein Angiography Optos (Transit OD)    2. Exudative age-related macular degeneration of left eye with active choroidal neovascularization ([place])  H35.3221       1.  OS no sign of recurrent CNVM stable at 65-monthinterval with will observe  2. OD at 9-week interval post injection, nonetheless CNVM activity.  Doing very well also can consider to be quiet at this time  3.  Repeat injection Avastin OD today and reevaluate next OU in 3 months Ophthalmic Meds Ordered this visit:  Meds ordered this encounter  Medications   bevacizumab (AVASTIN) SOSY 2.5 mg       Return in about 3 months (around 07/02/2022) for DILATE OU, AVASTIN OCT, OD.  There are no Patient Instructions on file for this visit.   Explained  the diagnoses, plan, and follow up with the patient and they expressed understanding.  Patient expressed understanding of the importance of proper follow up care.   Clent Demark Khristin Keleher M.D. Diseases & Surgery of the Retina and Vitreous [facility]abetic [place] 04/01/22     Abbreviations: M myopia (nearsighted); A astigmatism; H hyperopia (farsighted); P presbyopia; Mrx spectacle prescription;  CTL contact lenses; OD right eye; OS left eye; OU both eyes  XT exotropia; ET esotropia; PEK punctate epithelial keratitis; PEE punctate epithelial erosions; DES dry eye  syndrome; MGD meibomian gland dysfunction; ATs artificial tears; PFAT's preservative free artificial tears; [place] nuclear sclerotic cataract; PSC posterior subcapsular cataract; ERM epi-retinal membrane; PVD posterior vitreous detachment; RD retinal detachment; DM diabetes mellitus; DR diabetic retinopathy; NPDR non-proliferative diabetic retinopathy; PDR proliferative diabetic retinopathy; CSME clinically significant macular edema; DME diabetic macular edema; dbh dot blot hemorrhages; CWS cotton wool spot; POAG primary open angle glaucoma; C/D cup-to-disc ratio; HVF humphrey visual field; GVF goldmann visual field; OCT optical coherence tomography; IOP intraocular pressure; BRVO Branch retinal vein occlusion; CRVO central retinal vein occlusion; CRAO central retinal artery occlusion; BRAO branch retinal artery occlusion; RT retinal tear; SB scleral buckle; PPV pars plana vitrectomy; VH Vitreous hemorrhage; PRP panretinal laser photocoagulation; IVK intravitreal kenalog; VMT vitreomacular traction; MH Macular hole;  NVD neovascularization of the disc; NVE neovascularization elsewhere; AREDS age related eye disease study; ARMD age related macular degeneration; POAG primary open angle glaucoma; EBMD epithelial/anterior basement membrane dystrophy; ACIOL anterior chamber intraocular lens; IOL intraocular lens; PCIOL posterior chamber intraocular lens; Phaco/IOL phacoemulsification with intraocular lens placement; [place] photorefractive keratectomy; LASIK laser assisted in situ keratomileusis; HTN hypertension; DM diabetes mellitus; COPD chronic obstructive pulmonary disease

## 2022-04-01 NOTE — Assessment & Plan Note (Signed)
OD much improved post injection Avastin current interval of examination and therapy is 9 weeks.  We will repeat injection today and reevaluate in 3 months

## 2022-04-02 ENCOUNTER — Ambulatory Visit (INDEPENDENT_AMBULATORY_CARE_PROVIDER_SITE_OTHER): Payer: Medicare HMO | Admitting: Family

## 2022-04-02 ENCOUNTER — Encounter: Payer: Self-pay | Admitting: Family

## 2022-04-02 VITALS — BP 124/58 | HR 50 | Temp 98.6°F | Resp 16 | Ht 69.0 in | Wt 191.1 lb

## 2022-04-02 DIAGNOSIS — H6123 Impacted cerumen, bilateral: Secondary | ICD-10-CM | POA: Insufficient documentation

## 2022-04-02 NOTE — Progress Notes (Signed)
Established Patient Office Visit  Subjective:  Patient ID: Alan [name], male    DOB: July 24, 1946  Age: 76 y.o. MRN: [id number]  CC:  Chief Complaint  Patient presents with   Ear Fullness    HPI Alan [name] is here today with concerns.   Feeling bil ear fullness.  Has had irrigated before.  Having some decreased hearing in ears especially in the right ear.  Heard his left ear pop as well x one  No pain in the ears.   No sore throat.  No nasal congestion No cough.   Past Medical History:  Diagnosis Date   CAD (coronary artery disease) 07/17/2004   Diverticulosis of colon 12/15/2005   Esophageal stricture    Gastritis    GERD (gastroesophageal reflux disease) 09/16/1996   Glaucoma    Heart disease    History of tobacco abuse    Quit 2000   Hx of cardiovascular stress test    ETT-Myoview (01/2014):  1.5 mm ST depression in inf leads at peak exercise; no ischemia or scar, EF 57%, Low Risk   Hyperlipidemia 03/17/1999   Hypertension 03/17/1999   Stress fracture of foot    right midfoot, per Dr. Canary Brim 2012    Past Surgical History:  Procedure Laterality Date   CATARACT EXTRACTION, BILATERAL     COLONOSCOPY  12/30/2005   Divertics, mild, 10 yrs   COLONOSCOPY  11/26/2006   Colitis biopsy neg   CORONARY ARTERY BYPASS GRAFT  07/27/2004   By Dr. Roxy Manns with a LIMA to the LAD, RIMA to the distal right coronary artery, spahenous vein graft first diagonal, saphenous vein graft to the first circumflex marginal, sequential saphenous vein graft to the second   ESOPHAGOGASTRODUODENOSCOPY  10/08/2000   Stricture distal esoph, repeat dilation done 2012   ESOPHAGOGASTRODUODENOSCOPY  10/08/2000   Stricture distal esoph   I & D EXTREMITY Left 01/24/2017   Procedure: MINOR INCISION AND DRAINAGE LEFT THUMB;  Surgeon: Leanora Cover, MD;  Location: [facility];  Service: Orthopedics;  Laterality: Left;    Family History  Problem Relation Age of Onset   Peripheral vascular  disease Mother    Stroke Father        x2   Hypertension Brother    Heart attack Brother        MI   Colon polyps Brother    Heart disease Brother        MI   Hypertension Sister    Breast cancer Sister    Hypertension Sister    Hypertension Sister    Colon polyps Other    Diabetes Neg Hx    Depression Neg Hx    Alcohol abuse Neg Hx    Drug abuse Neg Hx    Prostate cancer Neg Hx    Colon cancer Neg Hx     Social History   Socioeconomic History   Marital status: Married    Spouse name: Not on file   Number of children: 2   Years of education: Not on file   Highest education level: Not on file  Occupational History   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    Employer: RETIRED    Comment: Retired  Tobacco Use   Smoking status: Former    Types: Cigarettes    Quit date: 09/16/1998    Years since quitting: 23.5   Smokeless tobacco: Never  Vaping Use   Vaping Use: Never used  Substance and Sexual Activity  Alcohol use: Yes    Comment: occasional beer   Drug use: No   Sexual activity: Not on file  Other Topics Concern   Not on file  Social History Narrative   Married in 1974   Lives with wife   Enjoys fishing   stretching, [employer]   [employer] fan   Social Determinants of Health   Financial Resource Strain: Not on file  Food Insecurity: Not on file  Transportation Needs: Not on file  Physical Activity: Not on file  Stress: Not on file  Social Connections: Not on file  Intimate Partner Violence: Not on file    Outpatient Medications Prior to Visit  Medication Sig Dispense Refill   allopurinol (ZYLOPRIM) 100 MG tablet TAKE 1 TABLET EVERY DAY 90 tablet 3   amLODipine (NORVASC) 10 MG tablet TAKE 1 TABLET EVERY DAY 90 tablet 3   aspirin 81 MG tablet Take 81 mg by mouth daily.     atorvastatin (LIPITOR) 40 MG tablet Take 1 tablet (40 mg total) by mouth daily. 90 tablet 3   dorzolamide-timolol (COSOPT) 22.3-6.8 MG/ML ophthalmic solution 1 drop 2 (two)  times daily.     doxazosin (CARDURA) 2 MG tablet Take 1 tablet (2 mg total) by mouth daily. 90 tablet 3   metoprolol tartrate (LOPRESSOR) 100 MG tablet TAKE 1 TABLET TWICE DAILY 180 tablet 3   sildenafil (REVATIO) 20 MG tablet Take 5 tablets (100 mg total) by mouth daily as needed. 50 tablet 12   No facility-administered medications prior to visit.    Allergies  Allergen Reactions   Hctz [Hydrochlorothiazide] Other (See Comments)    Would avoid due to h/o gout.     Septra [Sulfamethoxazole-Trimethoprim]     diarrhea   Spironolactone Other (See Comments)    Possible cause of aches.     Valsartan Other (See Comments)    Possible cause of aches.          Objective:    Physical Exam Constitutional:      General: He is not in acute distress.    Appearance: Normal appearance. He is normal weight. He is not ill-appearing, toxic-appearing or diaphoretic.  HENT:     Right Ear: External ear normal. There is impacted cerumen.     Left Ear: External ear normal. There is impacted cerumen.  Pulmonary:     Effort: Pulmonary effort is normal.  Neurological:     Mental Status: He is alert.     BP (!) 124/58   Pulse (!) 50   Temp 98.6 F (37 C)   Resp 16   Ht '5\' 9"'$  (1.753 m)   Wt 191 lb 2 oz (86.7 kg)   SpO2 95%   BMI 28.22 kg/m  Wt Readings from Last 3 Encounters:  04/02/22 191 lb 2 oz (86.7 kg)  12/31/21 198 lb (89.8 kg)  12/04/21 198 lb (89.8 kg)     Health Maintenance Due  Topic Date Due   COVID-19 Vaccine (1) Never done   Zoster Vaccines- Shingrix (1 of 2) Never done    There are no preventive care reminders to display for this patient.  Lab Results  Component Value Date   TSH 1.70 07/19/2011   Lab Results  Component Value Date   WBC 7.1 07/17/2017   HGB 13.3 07/17/2017   HCT 39.2 07/17/2017   MCV 90.4 07/17/2017   PLT 203.0 07/17/2017   Lab Results  Component Value Date   NA 142 11/27/2021   K 4.0 11/27/2021  CO2 32 11/27/2021   GLUCOSE 98  11/27/2021   BUN 13 11/27/2021   CREATININE 0.88 11/27/2021   BILITOT 0.7 11/27/2021   ALKPHOS 79 11/27/2021   AST 14 11/27/2021   ALT 18 11/27/2021   PROT 6.7 11/27/2021   ALBUMIN 4.3 11/27/2021   CALCIUM 9.8 11/27/2021   GFR 84.15 11/27/2021   Lab Results  Component Value Date   HGBA1C 5.6 11/27/2021      Assessment & Plan:   Problem List Items Addressed This Visit       Nervous and Auditory   Impacted cerumen of both ears - Primary    Ceruminosis is noted.  Obtained verbal patient consent prior to procedure, possible risks of procedure discussed with pt prior, and then Wax was removed by syringing/irrigation and manual debridement was performed by me with curette. Instructions for home care to prevent wax buildup are given and handout provided to pt .pt tolerated procedure well.        No orders of the defined types were placed in this encounter.   Follow-up: Return if symptoms worsen or fail to improve with pcp.    Eugenia Pancoast, FNP

## 2022-04-02 NOTE — Assessment & Plan Note (Signed)
Ceruminosis is noted.  Obtained verbal patient consent prior to procedure, possible risks of procedure discussed with pt prior, and then Wax was removed by syringing/irrigation and manual debridement was performed by me with curette. Instructions for home care to prevent wax buildup are given and handout provided to pt .pt tolerated procedure well.   

## 2022-04-02 NOTE — Patient Instructions (Signed)
Due to recent changes in healthcare laws, you may see results of your imaging and/or laboratory studies on MyChart before I have had a chance to review them.  I understand that in some cases there may be results that are confusing or concerning to you. Please understand that not all results are received at the same time and often I may need to interpret multiple results in order to provide you with the best plan of care or course of treatment. Therefore, I ask that you please give me 2 business days to thoroughly review all your results before contacting my office for clarification. Should we see a critical lab result, you will be contacted sooner.   It was a pleasure seeing you today! Please do not hesitate to reach out with any questions and or concerns.  Regards,   Merrill Villarruel FNP-C  

## 2022-04-15 ENCOUNTER — Encounter (INDEPENDENT_AMBULATORY_CARE_PROVIDER_SITE_OTHER): Payer: Self-pay | Admitting: Ophthalmology

## 2022-04-15 ENCOUNTER — Encounter (INDEPENDENT_AMBULATORY_CARE_PROVIDER_SITE_OTHER): Payer: Medicare HMO | Admitting: Ophthalmology

## 2022-04-15 ENCOUNTER — Ambulatory Visit (INDEPENDENT_AMBULATORY_CARE_PROVIDER_SITE_OTHER): Payer: Medicare HMO | Admitting: Ophthalmology

## 2022-04-15 DIAGNOSIS — H353231 Exudative age-related macular degeneration, bilateral, with active choroidal neovascularization: Secondary | ICD-10-CM

## 2022-04-15 DIAGNOSIS — H353221 Exudative age-related macular degeneration, left eye, with active choroidal neovascularization: Secondary | ICD-10-CM | POA: Diagnosis not present

## 2022-04-15 DIAGNOSIS — H353211 Exudative age-related macular degeneration, right eye, with active choroidal neovascularization: Secondary | ICD-10-CM | POA: Diagnosis not present

## 2022-04-15 MED ORDER — BEVACIZUMAB CHEMO INJECTION 1.25MG/0.05ML SYRINGE FOR KALEIDOSCOPE
1.2500 mg | INTRAVITREAL | Status: AC | PRN
  Administered 2022-04-15: 1.25 mg via INTRAVITREAL

## 2022-04-15 NOTE — Progress Notes (Signed)
04/15/2022     CHIEF COMPLAINT Patient presents for  Chief Complaint  Patient presents with   Macular Degeneration      HISTORY OF PRESENT ILLNESS: Alan [name] is a 76 y.o. male who presents to the clinic today for:   HPI   Pt states his vision has been stable OS today at some 4 months post most recent injection.  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      Referring physician: Tonia Ghent, MD [place],  [place] [address]  HISTORICAL INFORMATION:   Selected notes from the MEDICAL RECORD NUMBER    Lab Results  Component Value Date   HGBA1C 5.6 11/27/2021     CURRENT MEDICATIONS: Current Outpatient Medications (Ophthalmic Drugs)  Medication Sig   dorzolamide-timolol (COSOPT) 22.3-6.8 MG/ML ophthalmic solution 1 drop 2 (two) times daily.   No current facility-administered medications for this visit. (Ophthalmic Drugs)   Current Outpatient Medications (Other)  Medication Sig   allopurinol (ZYLOPRIM) 100 MG tablet TAKE 1 TABLET EVERY DAY   amLODipine (NORVASC) 10 MG tablet TAKE 1 TABLET EVERY DAY   aspirin 81 MG tablet Take 81 mg by mouth daily.   atorvastatin (LIPITOR) 40 MG tablet Take 1 tablet (40 mg total) by mouth daily.   doxazosin (CARDURA) 2 MG tablet Take 1 tablet (2 mg total) by mouth daily.   metoprolol tartrate (LOPRESSOR) 100 MG tablet TAKE 1 TABLET TWICE DAILY   sildenafil (REVATIO) 20 MG tablet Take 5 tablets (100 mg total) by mouth daily as needed.   No current facility-administered medications for this visit. (Other)      REVIEW OF SYSTEMS: ROS   Negative for: Constitutional, Gastrointestinal, Neurological, Skin, Genitourinary, Musculoskeletal, HENT, Endocrine, Cardiovascular, Eyes,  Respiratory, Psychiatric, Allergic/Imm, Heme/Lymph Last edited by Morene Rankins, CMA on 04/15/2022  9:11 AM.       ALLERGIES Allergies  Allergen Reactions   Hctz [Hydrochlorothiazide] Other (See Comments)    Would avoid due to h/o gout.     Septra [Sulfamethoxazole-Trimethoprim]     diarrhea   Spironolactone Other (See Comments)    Possible cause of aches.     Valsartan Other (See Comments)    Possible cause of aches.      PAST MEDICAL HISTORY Past Medical History:  Diagnosis Date   CAD (coronary artery disease) 07/17/2004   Diverticulosis of colon 12/15/2005   Esophageal stricture    Gastritis    GERD (gastroesophageal reflux disease) 09/16/1996   Glaucoma    Heart disease    History of tobacco abuse    Quit 2000   Hx of cardiovascular stress test    ETT-Myoview (01/2014):  1.5 mm ST depression in inf leads at peak exercise; no ischemia or scar, EF 57%, Low Risk   Hyperlipidemia 03/17/1999   Hypertension 03/17/1999   Stress fracture of foot    right midfoot, per Dr. Canary Brim 2012   Past Surgical History:  Procedure Laterality Date   CATARACT EXTRACTION, BILATERAL     COLONOSCOPY  12/30/2005   Divertics, mild, 10 yrs   COLONOSCOPY  11/26/2006   Colitis biopsy neg   CORONARY ARTERY BYPASS GRAFT  07/27/2004   By  Dr. Roxy Manns with a LIMA to the LAD, RIMA to the distal right coronary artery, spahenous vein graft first diagonal, saphenous vein graft to the first circumflex marginal, sequential saphenous vein graft to the second   ESOPHAGOGASTRODUODENOSCOPY  10/08/2000   Stricture distal esoph, repeat dilation done 2012   ESOPHAGOGASTRODUODENOSCOPY  10/08/2000   Stricture distal esoph   I & D EXTREMITY Left 01/24/2017   Procedure: MINOR INCISION AND DRAINAGE LEFT THUMB;  Surgeon: Leanora Cover, MD;  Location: [facility];  Service: Orthopedics;  Laterality: Left;    FAMILY HISTORY Family History  Problem Relation Age of Onset   Peripheral vascular disease Mother     Stroke Father        x2   Hypertension Brother    Heart attack Brother        MI   Colon polyps Brother    Heart disease Brother        MI   Hypertension Sister    Breast cancer Sister    Hypertension Sister    Hypertension Sister    Colon polyps Other    Diabetes Neg Hx    Depression Neg Hx    Alcohol abuse Neg Hx    Drug abuse Neg Hx    Prostate cancer Neg Hx    Colon cancer Neg Hx     SOCIAL HISTORY Social History   Tobacco Use   Smoking status: Former    Types: Cigarettes    Quit date: 09/16/1998    Years since quitting: 23.5   Smokeless tobacco: Never  Vaping Use   Vaping Use: Never used  Substance Use Topics   Alcohol use: Yes    Comment: occasional beer   Drug use: No         OPHTHALMIC EXAM:  Base Eye Exam     Visual Acuity (ETDRS)       Right Left   Dist [place] 20/20 20/20         Tonometry (Tonopen, 9:16 AM)       Right Left   Pressure 14 12         Pupils       Pupils APD   Right PERRL None   Left PERRL          Neuro/Psych     Oriented x3: Yes   Mood/Affect: Normal         Dilation     Left eye: 2.5% Phenylephrine, 1.0% Mydriacyl @ 9:12 AM           Slit Lamp and Fundus Exam     External Exam       Right Left   External Normal Normal         Slit Lamp Exam       Right Left   Lids/Lashes Normal Normal   Conjunctiva/Sclera White and quiet White and quiet   Cornea Clear Clear   Anterior Chamber Deep and quiet Deep and quiet   Iris Round and reactive Round and reactive   Lens Centered posterior chamber intraocular lens Centered posterior chamber intraocular lens   Anterior Vitreous Normal Normal         Fundus Exam       Right Left   Posterior Vitreous  Normal   Disc  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 Scan locations included subfoveal. Central Foveal Thickness: 244. Progression has improved. Findings include abnormal foveal contour, vitreomacular adhesion .   Left Eye Quality was good. Scan locations included subfoveal. Central Foveal Thickness: 252. Progression has improved. Findings include abnormal foveal contour, retinal drusen .   Notes OS today at 4 months post injection Avastin looks great with less activity then we will repeat injection today maintenance.  OD today also 2 weeks post injection Avastin looks great with no peripapillary CNVM activity.,       Intravitreal Injection, Pharmacologic Agent - OS - Left Eye       Time Out 04/15/2022. 9:42 AM. Confirmed correct patient, procedure, site, and patient consented.   Anesthesia Topical anesthesia was used. Anesthetic medications included Lidocaine 4%.   Procedure Preparation included 5% betadine to ocular surface, 10% betadine to eyelids, Ofloxacin . A 30 gauge needle was used.   Injection: 1.25 mg Bevacizumab 1.'25mg'$ /0.67m   Route: Intravitreal, Site: Left Eye   NDC: [id number]  Post-op Post injection exam found visual acuity of at least counting fingers. The patient tolerated the procedure well. There were no complications. The patient received written and verbal post procedure care education. Post injection medications included ocuflox.              ASSESSMENT/PLAN:  Exudative age-related macular degeneration of left eye with active choroidal neovascularization (HCC) Today is 4 months post most recent injection.  Still stable no sign of recurrence. History of recurrences.  Repeat injection today for maintenance therapy  Exudative age-related macular degeneration of right eye with active choroidal neovascularization (HCC) OD at 2 weeks post most recent evaluation injection.  Follow-up as scheduled some around 9 to 10 weeks.  As  scheduled     ICD-10-CM   1. Exudative age-related macular degeneration of left eye with active choroidal neovascularization (HCC)  H35.3221 OCT, Retina - OU - Both Eyes    Intravitreal Injection, Pharmacologic Agent - OS - Left Eye    Bevacizumab (AVASTIN) SOLN 1.25 mg    2. Exudative age-related macular degeneration of right eye with active choroidal neovascularization (HVining  H35.3211       1.  2.  3.  Ophthalmic Meds Ordered this visit:  Meds ordered this encounter  Medications   Bevacizumab (AVASTIN) SOLN 1.25 mg       Return in about 4 months (around 08/15/2022) for DILATE OU, OCT possible injection Avastin OS,, follow-up OD as scheduled.  There are no Patient Instructions on file for this visit.   Explained the diagnoses, plan, and follow up with the patient and they expressed understanding.  Patient expressed understanding of the importance of proper follow up care.   GClent DemarkRankin M.D. Diseases & Surgery of the Retina and Vitreous [facility]ina [facility] Diabetic [place]07/31/23     Abbreviations: M myopia (nearsighted); A astigmatism; H hyperopia (farsighted); P presbyopia; Mrx spectacle prescription;  CTL contact lenses; OD right eye; OS left eye; OU both eyes  XT exotropia; ET esotropia; PEK punctate epithelial keratitis; PEE punctate epithelial erosions; DES dry eye syndrome; MGD meibomian gland dysfunction; ATs artificial tears; PFAT's preservative free artificial tears; [place] sclerotic cataract; PSC posterior subcapsular cataract; ERM epi-retinal membrane; PVD posterior vitreous detachment; RD retinal detachment; DM diabetes mellitus; DR diabetic retinopathy; NPDR non-proliferative diabetic retinopathy; PDR proliferative diabetic retinopathy;  CSME clinically significant macular edema; DME diabetic macular edema; dbh dot blot hemorrhages; CWS cotton wool spot; POAG primary open angle glaucoma; C/D cup-to-disc ratio; HVF humphrey visual field; GVF goldmann visual  field; OCT optical coherence tomography; IOP intraocular pressure; BRVO Branch retinal vein occlusion; CRVO central retinal vein occlusion; CRAO central retinal artery occlusion; BRAO branch retinal artery occlusion; RT retinal tear; SB scleral buckle; PPV pars plana vitrectomy; VH Vitreous hemorrhage; PRP panretinal laser photocoagulation; IVK intravitreal kenalog; VMT vitreomacular traction; MH Macular hole;  NVD neovascularization of the disc; NVE neovascularization elsewhere; AREDS age related eye disease study; ARMD age related macular degeneration; POAG primary open angle glaucoma; EBMD epithelial/anterior basement membrane dystrophy; ACIOL anterior chamber intraocular lens; IOL intraocular lens; PCIOL posterior chamber intraocular lens; Phaco/IOL phacoemulsification with intraocular lens placement; [place] photorefractive keratectomy; LASIK laser assisted in situ keratomileusis; HTN hypertension; DM diabetes mellitus; COPD chronic obstructive pulmonary disease

## 2022-04-15 NOTE — Assessment & Plan Note (Signed)
OD at 2 weeks post most recent evaluation injection.  Follow-up as scheduled some around 9 to 10 weeks.  As scheduled

## 2022-04-15 NOTE — Assessment & Plan Note (Signed)
Today is 4 months post most recent injection.  Still stable no sign of recurrence. History of recurrences.  Repeat injection today for maintenance therapy

## 2022-04-16 ENCOUNTER — Encounter (INDEPENDENT_AMBULATORY_CARE_PROVIDER_SITE_OTHER): Payer: Self-pay

## 2022-04-16 DIAGNOSIS — Z961 Presence of intraocular lens: Secondary | ICD-10-CM | POA: Diagnosis not present

## 2022-04-16 DIAGNOSIS — H35053 Retinal neovascularization, unspecified, bilateral: Secondary | ICD-10-CM | POA: Diagnosis not present

## 2022-04-16 DIAGNOSIS — H02831 Dermatochalasis of right upper eyelid: Secondary | ICD-10-CM | POA: Diagnosis not present

## 2022-04-16 DIAGNOSIS — H40013 Open angle with borderline findings, low risk, bilateral: Secondary | ICD-10-CM | POA: Diagnosis not present

## 2022-04-24 DIAGNOSIS — Z08 Encounter for follow-up examination after completed treatment for malignant neoplasm: Secondary | ICD-10-CM | POA: Diagnosis not present

## 2022-04-24 DIAGNOSIS — D485 Neoplasm of uncertain behavior of skin: Secondary | ICD-10-CM | POA: Diagnosis not present

## 2022-04-24 DIAGNOSIS — C44622 Squamous cell carcinoma of skin of right upper limb, including shoulder: Secondary | ICD-10-CM | POA: Diagnosis not present

## 2022-04-24 DIAGNOSIS — Z85828 Personal history of other malignant neoplasm of skin: Secondary | ICD-10-CM | POA: Diagnosis not present

## 2022-04-24 DIAGNOSIS — L814 Other melanin hyperpigmentation: Secondary | ICD-10-CM | POA: Diagnosis not present

## 2022-04-24 DIAGNOSIS — L570 Actinic keratosis: Secondary | ICD-10-CM | POA: Diagnosis not present

## 2022-04-24 DIAGNOSIS — L821 Other seborrheic keratosis: Secondary | ICD-10-CM | POA: Diagnosis not present

## 2022-05-07 DIAGNOSIS — C44622 Squamous cell carcinoma of skin of right upper limb, including shoulder: Secondary | ICD-10-CM | POA: Diagnosis not present

## 2022-06-25 ENCOUNTER — Ambulatory Visit (INDEPENDENT_AMBULATORY_CARE_PROVIDER_SITE_OTHER): Payer: Medicare HMO | Admitting: Family Medicine

## 2022-06-25 ENCOUNTER — Encounter: Payer: Self-pay | Admitting: Family Medicine

## 2022-06-25 VITALS — BP 118/62 | HR 52 | Temp 97.8°F | Wt 197.0 lb

## 2022-06-25 DIAGNOSIS — R269 Unspecified abnormalities of gait and mobility: Secondary | ICD-10-CM | POA: Diagnosis not present

## 2022-06-25 DIAGNOSIS — Z23 Encounter for immunization: Secondary | ICD-10-CM

## 2022-06-25 LAB — CBC WITH DIFFERENTIAL/PLATELET
Basophils Absolute: 0 10*3/uL (ref 0.0–0.1)
Basophils Relative: 1 % (ref 0.0–3.0)
Eosinophils Absolute: 0.1 10*3/uL (ref 0.0–0.7)
Eosinophils Relative: 1.4 % (ref 0.0–5.0)
HCT: 43.5 % (ref 39.0–52.0)
Hemoglobin: 15 g/dL (ref 13.0–17.0)
Lymphocytes Relative: 29.2 % (ref 12.0–46.0)
Lymphs Abs: 1.3 10*3/uL (ref 0.7–4.0)
MCHC: 34.4 g/dL (ref 30.0–36.0)
MCV: 93.4 fl (ref 78.0–100.0)
Monocytes Absolute: 0.4 10*3/uL (ref 0.1–1.0)
Monocytes Relative: 9 % (ref 3.0–12.0)
Neutro Abs: 2.7 10*3/uL (ref 1.4–7.7)
Neutrophils Relative %: 59.4 % (ref 43.0–77.0)
Platelets: 146 10*3/uL — ABNORMAL LOW (ref 150.0–400.0)
RBC: 4.65 Mil/uL (ref 4.22–5.81)
RDW: 13.5 % (ref 11.5–15.5)
WBC: 4.6 10*3/uL (ref 4.0–10.5)

## 2022-06-25 LAB — COMPREHENSIVE METABOLIC PANEL
ALT: 15 U/L (ref 0–53)
AST: 13 U/L (ref 0–37)
Albumin: 4.2 g/dL (ref 3.5–5.2)
Alkaline Phosphatase: 85 U/L (ref 39–117)
BUN: 13 mg/dL (ref 6–23)
CO2: 28 mEq/L (ref 19–32)
Calcium: 9.8 mg/dL (ref 8.4–10.5)
Chloride: 104 mEq/L (ref 96–112)
Creatinine, Ser: 0.86 mg/dL (ref 0.40–1.50)
GFR: 84.39 mL/min (ref >60.00)
Glucose, Bld: 95 mg/dL (ref 70–99)
Potassium: 4.1 mEq/L (ref 3.5–5.1)
Sodium: 139 mEq/L (ref 135–145)
Total Bilirubin: 0.8 mg/dL (ref 0.2–1.2)
Total Protein: 7.1 g/dL (ref 6.0–8.3)

## 2022-06-25 NOTE — Progress Notes (Unsigned)
Having issues with balance x 1 year off and on. Patient does not get any dizziness when this happens.   Recently was in the yard and had an episode. He was spraying a herbicide in the yard and "I had to get my feet right" to keep from falling.  He didn't fall.    Episodes are irregular.  Sx are not constant.  He has noted gait changes when he is tired, ie he takes shorter steps.  Sx going on for about 1 year.  He is more aware of his balance.  No tremor, resting or o/w.    Not lightheaded on standing.  No room spinning. He can feel the floor normally and doesn't have a foot drop. No new meds.    No FCNAVD.  No dysphagia.  Feels well o/w.  No eyelid droop or double vision.    Meds, vitals, and allergies reviewed.   ROS: Per HPI unless specifically indicated in ROS section   Normal gait on walking today in clinic except for lack of balance with heel to toe.

## 2022-06-25 NOTE — Patient Instructions (Signed)
Go to the lab on the way out.   If you have mychart we'll likely use that to update you.    Take care.  Glad to see you. Let me see about options in the meantime.   Depending on your labs we may need to get you to see neurology and/or get extra imaging done.

## 2022-06-26 ENCOUNTER — Other Ambulatory Visit: Payer: Self-pay | Admitting: Family Medicine

## 2022-06-26 DIAGNOSIS — R269 Unspecified abnormalities of gait and mobility: Secondary | ICD-10-CM

## 2022-06-26 LAB — VITAMIN B12: Vitamin B-12: 299 pg/mL (ref 211–911)

## 2022-06-26 LAB — TSH: TSH: 2.02 u[IU]/mL (ref 0.35–5.50)

## 2022-06-26 NOTE — Telephone Encounter (Signed)
When he has been called about CT and neurology, let me know and I will sign the orders.  Thanks.

## 2022-06-26 NOTE — Assessment & Plan Note (Signed)
Of unclear source.  He has a normal neurologic exam except for specific gait testing with heel-to-toe walking where he loses his balance.  Romberg is normal.  I think it makes sense to check basic labs.  We talked about potential follow-up with neurology versus imaging.  See notes on labs.

## 2022-06-27 NOTE — Telephone Encounter (Signed)
Patient aware of results.

## 2022-06-28 ENCOUNTER — Encounter: Payer: Self-pay | Admitting: Neurology

## 2022-06-28 ENCOUNTER — Encounter: Payer: Self-pay | Admitting: *Deleted

## 2022-06-28 MED ORDER — VITAMIN B-12 1000 MCG PO TABS: 1000.0000 ug | ORAL_TABLET | Freq: Every day | ORAL | Status: DC

## 2022-06-28 NOTE — Telephone Encounter (Signed)
Done. Thanks.

## 2022-07-02 ENCOUNTER — Encounter (INDEPENDENT_AMBULATORY_CARE_PROVIDER_SITE_OTHER): Payer: Medicare HMO | Admitting: Ophthalmology

## 2022-07-15 ENCOUNTER — Ambulatory Visit (INDEPENDENT_AMBULATORY_CARE_PROVIDER_SITE_OTHER): Payer: Medicare HMO | Admitting: Family Medicine

## 2022-07-15 ENCOUNTER — Telehealth: Payer: Self-pay

## 2022-07-15 ENCOUNTER — Encounter: Payer: Self-pay | Admitting: Family Medicine

## 2022-07-15 VITALS — BP 118/60 | HR 56 | Temp 98.5°F | Ht 69.0 in | Wt 194.1 lb

## 2022-07-15 DIAGNOSIS — J069 Acute upper respiratory infection, unspecified: Secondary | ICD-10-CM

## 2022-07-15 DIAGNOSIS — R051 Acute cough: Secondary | ICD-10-CM

## 2022-07-15 LAB — POC COVID19 BINAXNOW: SARS Coronavirus 2 Ag: NEGATIVE

## 2022-07-15 MED ORDER — DOXYCYCLINE HYCLATE 100 MG PO TABS: 100.0000 mg | ORAL_TABLET | Freq: Two times a day (BID) | ORAL | 0 refills | Status: DC

## 2022-07-15 NOTE — Telephone Encounter (Signed)
I spoke with pts wife (DPR signed) symptoms started on 07/09/22 with S/T then non prod cough and pt has been taking mucinex and delsym. Last night started with prod cough with clear phlegm; no fever and neg covid test on 07/13/22 ; no S/T,CP,SOB or wheezing today. Pt was not seen over weekend; pt scheduled appt with Dr Lorelei Pont on 07/15/22 at 10:40 with UC & ED  precautions. Sending note to Dr Lorelei Pont.

## 2022-07-15 NOTE — Progress Notes (Signed)
Tyann Niehaus T. Laketia Vicknair, MD, [place] at [facility] [place], [address]  Phone: [phone]  FAX: [phone]  Alan [name] - 76 y.o. male  MRN [id number]  Date of Birth: [birth year]  Date: 07/15/2022  PCP: Tonia Ghent, MD  Referral: Tonia Ghent, MD  Chief Complaint  Patient presents with   Cough    X 4 days with clear phlegm-Negative Covid Test 07/13/22 Been taking Mucinex and just bought Delsym but hasn't taken yet   Subjective:   Alan [name] is a 76 y.o. very pleasant male patient with Body mass index is 28.67 kg/m. who presents with the following:  Last wed, started with a sore throat.  Then started to get a cough.  Sat got a little bit better, and was up til about two or three in the morning.  Has had bad coughing spells.  Last night, had been feeling pretty good.  Was taking his mucinex, and then got a bad coughing spell.  Coughed really bad and started to get a heavy cough that was productive.  Felt like h ewas going to cough something up.  Went to bed, and then he had been up most of the night.  Heard himself breathing throughout the night.  Felt it in his throat.    He did take one neg covid test 2 days into his symptoms.   Review of Systems is noted in the HPI, as appropriate  Objective:   BP 118/60   Pulse (!) 56   Temp 98.5 F (36.9 C) (Oral)   Ht '5\' 9"'$  (1.753 m)   Wt 194 lb 2 oz (88.1 kg)   SpO2 93%   BMI 28.67 kg/m    Gen: WDWN, NAD. Globally Non-toxic HEENT: Throat clear, w/o exudate, R TM clear, L TM - good landmarks, No fluid present. rhinnorhea.  MMM Frontal sinuses: NT Max sinuses: NT NECK: Anterior cervical  LAD is absent CV: RRR, No M/G/R, cap refill <2 sec PULM: Breathing comfortably in no respiratory distress. no wheezing, crackles, rhonchi   Laboratory and Imaging Data: Results for orders placed or performed in visit on 07/15/22  POC COVID-19  Result Value  Ref Range   SARS Coronavirus 2 Ag Negative Negative     Assessment and Plan:     ICD-10-CM   1. Viral URI  J06.9     2. Acute cough  R05.1 POC COVID-19     Likely viral syndrome with significant cough.  I am Alvan Dame encouraged him to continue taking Mucinex, and he can add some Delsym that he has at home that he has not tried.  Anticipate this will resolve on its own.  I sent him in a prescription for some doxycycline, and he is going to wait an additional 5 days before filling this.  If he is getting better, he will leave that alone.  Medication Management during today's office visit: Meds ordered this encounter  Medications   doxycycline (VIBRA-TABS) 100 MG tablet    Sig: Take 1 tablet (100 mg total) by mouth 2 (two) times daily.    Dispense:  20 tablet    Refill:  0   There are no discontinued medications.  Orders placed today for conditions managed today: Orders Placed This Encounter  Procedures   POC COVID-19    Disposition: No follow-ups on file.  Dragon [employer] One speech-to-text software was used for transcription in this dictation.  Possible transcriptional errors  can occur using Editor, commissioning.   Signed,  Maud Deed. Zeb Rawl, MD   Outpatient Encounter Medications as of 07/15/2022  Medication Sig   allopurinol (ZYLOPRIM) 100 MG tablet TAKE 1 TABLET EVERY DAY   amLODipine (NORVASC) 10 MG tablet TAKE 1 TABLET EVERY DAY   aspirin 81 MG tablet Take 81 mg by mouth daily.   atorvastatin (LIPITOR) 40 MG tablet Take 1 tablet (40 mg total) by mouth daily.   cyanocobalamin (VITAMIN B12) 1000 MCG tablet Take 1 tablet (1,000 mcg total) by mouth daily.   dorzolamide-timolol (COSOPT) 22.3-6.8 MG/ML ophthalmic solution 1 drop 2 (two) times daily.   doxazosin (CARDURA) 2 MG tablet Take 1 tablet (2 mg total) by mouth daily.   doxycycline (VIBRA-TABS) 100 MG tablet Take 1 tablet (100 mg total) by mouth 2 (two) times daily.   metoprolol tartrate (LOPRESSOR) 100 MG tablet TAKE 1  TABLET TWICE DAILY   sildenafil (REVATIO) 20 MG tablet Take 5 tablets (100 mg total) by mouth daily as needed.   No facility-administered encounter medications on file as of 07/15/2022.                                                              Medication Management during today's office visit: Meds ordered this encounter  Medications   doxycycline (VIBRA-TABS) 100 MG tablet    Sig: Take 1 tablet (100 mg total) by mouth 2 (two) times daily.    Dispense:  20 tablet    Refill:  0   There are no discontinued medications.  Orders placed today for conditions managed today: Orders Placed This Encounter  Procedures   POC COVID-19    Disposition: No follow-ups on file.  Dr[employer] One speech-to-text software was used for transcription in this dictation.  Possible transcriptional errors can occur using Editor, commissioning.   Signed,  Maud Deed. Rosezetta Balderston, MD   Outpatient Encounter Medications as of 07/15/2022  Medication Sig   allopurinol (ZYLOPRIM) 100 MG tablet TAKE 1 TABLET EVERY DAY   amLODipine (NORVASC) 10 MG tablet TAKE 1 TABLET EVERY DAY   aspirin 81 MG tablet Take 81 mg by mouth daily.   atorvastatin (LIPITOR) 40 MG tablet Take 1 tablet (40 mg total) by mouth daily.   cyanocobalamin (VITAMIN B12) 1000 MCG tablet Take 1 tablet (1,000 mcg total) by mouth daily.   dorzolamide-timolol (COSOPT) 22.3-6.8 MG/ML ophthalmic solution 1 drop 2 (two) times daily.   doxazosin (CARDURA) 2 MG tablet Take 1 tablet (2 mg total) by mouth daily.   doxycycline (VIBRA-TABS) 100 MG tablet Take 1 tablet (100 mg total) by mouth 2 (two) times daily.   metoprolol tartrate (LOPRESSOR) 100 MG tablet TAKE 1 TABLET TWICE DAILY   sildenafil (REVATIO) 20 MG tablet Take 5 tablets (100 mg total) by mouth daily as needed.   No facility-administered encounter medications on file as of 07/15/2022.

## 2022-07-15 NOTE — Telephone Encounter (Signed)
[facility] Night - Client TELEPHONE ADVICE RECORD [facility] Patient Name: Alan [name] Gender: Male DOB: April 21, 1946 Age: 76 Y 28 D Return Phone Number: [phone] (Primary) [place]  [address] Client [place] Night - Client Client Site [facility] - Night Provider Renford Dills - MD Contact Type Call Who Is Calling Patient / Member / Family / Caregiver Call Type Triage / Clinical Caller Name Alfonso Carden Relationship To Patient Spouse Return Phone Number [phone] (Primary) Chief Complaint Cough Reason for Call Symptomatic / Request for [place] states her husband has been coughing all night and did not sleep Translation No Nurse Assessment Nurse: Laqueta Due, RN, Metallurgist (Eastern Time): 07/13/2022 10:57:18 AM Confirm and document reason for call. If symptomatic, describe symptoms. ---Pt has been coughing all night and did not sleep. no fever. cough onset 3 days ago and had a sore throat (started with sore throat then the cough). covid test negative. denies known exposure to covid or flu. Does the patient have any new or worsening symptoms? ---Yes Will a triage be completed? ---Yes Related visit to physician within the last 2 weeks? ---Yes Does the PT have any chronic conditions? (i.e. diabetes, asthma, this includes High risk factors for pregnancy, etc.) ---Yes List chronic conditions. ---HTN, 2008 heart bypass Is this a behavioral health or substance abuse call? ---No Guidelines Guideline Title Affirmed Question Affirmed Notes Nurse Date/Time (Eastern Time) Cough - Acute NonProductive SEVERE coughing spells (e.g., whooping sound after coughing, vomiting after coughing) Laqueta Due, RN, [employer] 07/13/2022 11:00:06 AM Disp. Time Eilene Ghazi Time) Disposition Final User 07/13/2022 10:25:53 AM Attempt made - line  busy Laqueta Due, RN, [employer] PLEASE NOTE: All timestamps contained within this report are represented as [place] Standard Time. CONFIDENTIALTY NOTICE: This fax transmission is intended only for the addressee. It contains information that is legally privileged, confidential or otherwise protected from use or disclosure. If you are not the intended recipient, you are strictly prohibited from reviewing, disclosing, copying using or disseminating any of this information or taking any action in reliance on or regarding this information. If you have received this fax in error, please notify us immediately by telephone so that we can arrange for its return to [place]. Phone: [phone], Toll-Free: [phone], Fax: [phone] Page: 2 of 2 Call Id: [id number] [place]. Time Eilene Ghazi Time) Disposition Final User 07/13/2022 11:10:09 AM See PCP within 24 Hours Yes Laqueta Due, RN, Museum/gallery conservator Final Disposition 07/13/2022 11:10:09 AM See PCP within 24 Hours Yes Laqueta Due, RN, Print production planner Understands Yes PreDisposition Did not know what to do Care Advice Given Per Guideline SEE PCP WITHIN 24 HOURS: * IF OFFICE WILL BE CLOSED: You need to be seen within the next 24 hours. A clinic or an urgent care center is often a good source of care if your doctor's office is closed or you can't get an appointment. COUGHING SPELLS: * Drink warm fluids. Inhale warm mist. This can help relax the airway and also loosen up phlegm. * Suck on cough drops or hard candy to coat the irritated throat. * COUGH SYRUP WITH DEXTROMETHORPHAN: An over-the-counter cough syrup can help your cough. The most common cough suppressant in over-the-counter cough medicines is dextromethorphan. * COUGH DROPS: Over-the-counter cough drops can help a lot, especially for mild coughs. They soothe an irritated throat and remove the tickle sensation in the back of the throat. Cough drops are easy to carry with you.  COUGH SYRUP  WITH DEXTROMETHORPHAN: * Cough syrups containing the cough suppressant dextromethorphan may help decrease your cough. HUMIDIFIER: CALL BACK IF: * Difficulty breathing occurs * You become worse CARE ADVICE given per Cough - Acute NonProductive (Adult) guideline. Comments User: Letta Moynahan, RN Date/Time (Eastern Time): 07/13/2022 10:25:35 AM during confirming that patient is located in [place], call was lost. attempted to call back and got busy signal. will try calling back later. Referrals GO TO FACILITY REFUSE

## 2022-07-16 NOTE — Telephone Encounter (Signed)
Pt was seen by Dr Lorelei Pont on 07/15/22.

## 2022-07-24 ENCOUNTER — Encounter: Payer: Self-pay | Admitting: Gastroenterology

## 2022-07-26 ENCOUNTER — Ambulatory Visit
Admission: RE | Admit: 2022-07-26 | Discharge: 2022-07-26 | Disposition: A | Discharge status: Home / Self Care | Payer: Medicare HMO | Source: Ambulatory Visit | Attending: Family Medicine | Admitting: Family Medicine

## 2022-07-26 DIAGNOSIS — R269 Unspecified abnormalities of gait and mobility: Secondary | ICD-10-CM | POA: Diagnosis not present

## 2022-07-26 DIAGNOSIS — R29818 Other symptoms and signs involving the nervous system: Secondary | ICD-10-CM | POA: Diagnosis not present

## 2022-07-26 DIAGNOSIS — I739 Peripheral vascular disease, unspecified: Secondary | ICD-10-CM | POA: Diagnosis not present

## 2022-07-29 DIAGNOSIS — H43392 Other vitreous opacities, left eye: Secondary | ICD-10-CM | POA: Diagnosis not present

## 2022-07-29 DIAGNOSIS — Z961 Presence of intraocular lens: Secondary | ICD-10-CM | POA: Diagnosis not present

## 2022-07-29 DIAGNOSIS — H35721 Serous detachment of retinal pigment epithelium, right eye: Secondary | ICD-10-CM | POA: Diagnosis not present

## 2022-07-29 DIAGNOSIS — H353231 Exudative age-related macular degeneration, bilateral, with active choroidal neovascularization: Secondary | ICD-10-CM | POA: Diagnosis not present

## 2022-07-29 DIAGNOSIS — H35453 Secondary pigmentary degeneration, bilateral: Secondary | ICD-10-CM | POA: Diagnosis not present

## 2022-07-29 DIAGNOSIS — H35363 Drusen (degenerative) of macula, bilateral: Secondary | ICD-10-CM | POA: Diagnosis not present

## 2022-07-30 DIAGNOSIS — H353211 Exudative age-related macular degeneration, right eye, with active choroidal neovascularization: Secondary | ICD-10-CM | POA: Diagnosis not present

## 2022-08-05 DIAGNOSIS — L570 Actinic keratosis: Secondary | ICD-10-CM | POA: Diagnosis not present

## 2022-08-05 DIAGNOSIS — D485 Neoplasm of uncertain behavior of skin: Secondary | ICD-10-CM | POA: Diagnosis not present

## 2022-08-05 DIAGNOSIS — Z08 Encounter for follow-up examination after completed treatment for malignant neoplasm: Secondary | ICD-10-CM | POA: Diagnosis not present

## 2022-08-05 DIAGNOSIS — C44329 Squamous cell carcinoma of skin of other parts of face: Secondary | ICD-10-CM | POA: Diagnosis not present

## 2022-08-05 DIAGNOSIS — L814 Other melanin hyperpigmentation: Secondary | ICD-10-CM | POA: Diagnosis not present

## 2022-08-05 DIAGNOSIS — L821 Other seborrheic keratosis: Secondary | ICD-10-CM | POA: Diagnosis not present

## 2022-08-05 DIAGNOSIS — B354 Tinea corporis: Secondary | ICD-10-CM | POA: Diagnosis not present

## 2022-08-05 DIAGNOSIS — Z85828 Personal history of other malignant neoplasm of skin: Secondary | ICD-10-CM | POA: Diagnosis not present

## 2022-08-05 DIAGNOSIS — D225 Melanocytic nevi of trunk: Secondary | ICD-10-CM | POA: Diagnosis not present

## 2022-08-15 ENCOUNTER — Encounter (INDEPENDENT_AMBULATORY_CARE_PROVIDER_SITE_OTHER): Payer: Medicare HMO | Admitting: Ophthalmology

## 2022-08-19 DIAGNOSIS — H353213 Exudative age-related macular degeneration, right eye, with inactive scar: Secondary | ICD-10-CM | POA: Diagnosis not present

## 2022-08-19 DIAGNOSIS — H353233 Exudative age-related macular degeneration, bilateral, with inactive scar: Secondary | ICD-10-CM | POA: Diagnosis not present

## 2022-08-19 DIAGNOSIS — H353223 Exudative age-related macular degeneration, left eye, with inactive scar: Secondary | ICD-10-CM | POA: Diagnosis not present

## 2022-08-19 DIAGNOSIS — H401132 Primary open-angle glaucoma, bilateral, moderate stage: Secondary | ICD-10-CM | POA: Diagnosis not present

## 2022-08-19 DIAGNOSIS — H353132 Nonexudative age-related macular degeneration, bilateral, intermediate dry stage: Secondary | ICD-10-CM | POA: Diagnosis not present

## 2022-08-23 NOTE — Progress Notes (Unsigned)
Initial neurology clinic note  SERVICE DATE: 08/29/22  Reason for Evaluation: Consultation requested by Tonia Ghent, MD for an opinion regarding gait imbalance. My final recommendations will be communicated back to the requesting physician by way of shared medical record or letter to requesting physician via [place] mail.  HPI: This is [name], a 76 y.o. right-handed male with a medical history of HTN, HLD, age-related macular degeneration, CAD s/p CABG, gout, former smoker who presents to neurology clinic with the chief complaint of gait imbalance. The patient is alone today.  Patient's symptoms started about 1 year ago. He started noticing he was taking shorter steps, like he is shuffling his steps. It is worse when he is tired or in the evenings. He has to take naps during the day due to being tired. He finds he is off balance. He denies falls.   He denies freezing when trying to move. He denies numbness or tingling in his legs. He denies tremor. He denies loss of smell. Patient denies bad dreams. He says his wife will tell him his legs are wild and he may kick her during the night. He does not think he snores. He thinks he usually sleeps well and feels refreshed in the morning. He denies morning headaches (or any headaches at all). He denies feeling lightheaded or dizzy when standing up.  He denies neck or back pain.  He takes B12 daily (1000 mcg). Patient is on metoprolol, norvasc, doxazosin for BP.  Patient denies any new medications.  He denies any constitutional symptoms like fever, night sweats, anorexia or unintentional weight loss.  EtOH use: 4 cans of beer, 4 days a week  Restrictive diet? No Family history of neuropathy/myopathy/NM or neurologic disease? Father died in 69s of stroke   MEDICATIONS:  Outpatient Encounter Medications as of 08/29/2022  Medication Sig   allopurinol (ZYLOPRIM) 100 MG tablet TAKE 1 TABLET EVERY DAY   amLODipine (NORVASC) 10 MG  tablet TAKE 1 TABLET EVERY DAY   aspirin 81 MG tablet Take 81 mg by mouth daily.   atorvastatin (LIPITOR) 40 MG tablet Take 1 tablet (40 mg total) by mouth daily.   cyanocobalamin (VITAMIN B12) 1000 MCG tablet Take 1 tablet (1,000 mcg total) by mouth daily.   dorzolamide-timolol (COSOPT) 22.3-6.8 MG/ML ophthalmic solution 1 drop 2 (two) times daily.   doxazosin (CARDURA) 2 MG tablet Take 1 tablet (2 mg total) by mouth daily.   doxycycline (VIBRA-TABS) 100 MG tablet Take 1 tablet (100 mg total) by mouth 2 (two) times daily.   metoprolol tartrate (LOPRESSOR) 100 MG tablet TAKE 1 TABLET TWICE DAILY   sildenafil (REVATIO) 20 MG tablet Take 5 tablets (100 mg total) by mouth daily as needed.   No facility-administered encounter medications on file as of 08/29/2022.    PAST MEDICAL HISTORY: Past Medical History:  Diagnosis Date   CAD (coronary artery disease) 07/17/2004   Diverticulosis of colon 12/15/2005   Esophageal stricture    Gastritis    GERD (gastroesophageal reflux disease) 09/16/1996   Glaucoma    Heart disease    History of tobacco abuse    Quit 2000   Hx of cardiovascular stress test    ETT-Myoview (01/2014):  1.5 mm ST depression in inf leads at peak exercise; no ischemia or scar, EF 57%, Low Risk   Hyperlipidemia 03/17/1999   Hypertension 03/17/1999   Stress fracture of foot    right midfoot, per Dr. Canary Brim 2012    PAST SURGICAL  HISTORY: Past Surgical History:  Procedure Laterality Date   CATARACT EXTRACTION, BILATERAL     COLONOSCOPY  12/30/2005   Divertics, mild, 10 yrs   COLONOSCOPY  11/26/2006   Colitis biopsy neg   CORONARY ARTERY BYPASS GRAFT  07/27/2004   By Dr. Roxy Manns with a LIMA to the LAD, RIMA to the distal right coronary artery, spahenous vein graft first diagonal, saphenous vein graft to the first circumflex marginal, sequential saphenous vein graft to the second   ESOPHAGOGASTRODUODENOSCOPY  10/08/2000   Stricture distal esoph, repeat dilation done 2012    ESOPHAGOGASTRODUODENOSCOPY  10/08/2000   Stricture distal esoph   I & D EXTREMITY Left 01/24/2017   Procedure: MINOR INCISION AND DRAINAGE LEFT THUMB;  Surgeon: Leanora Cover, MD;  Location: [facility];  Service: Orthopedics;  Laterality: Left;    ALLERGIES: Allergies  Allergen Reactions   Hctz [Hydrochlorothiazide] Other (See Comments)    Would avoid due to h/o gout.     Septra [Sulfamethoxazole-Trimethoprim]     diarrhea   Spironolactone Other (See Comments)    Possible cause of aches.     Valsartan Other (See Comments)    Possible cause of aches.      FAMILY HISTORY: Family History  Problem Relation Age of Onset   Peripheral vascular disease Mother    Stroke Father        x2   Hypertension Brother    Heart attack Brother        MI   Colon polyps Brother    Heart disease Brother        MI   Hypertension Sister    Breast cancer Sister    Hypertension Sister    Hypertension Sister    Colon polyps Other    Diabetes Neg Hx    Depression Neg Hx    Alcohol abuse Neg Hx    Drug abuse Neg Hx    Prostate cancer Neg Hx    Colon cancer Neg Hx     SOCIAL HISTORY: Social History   Tobacco Use   Smoking status: Former    Types: Cigarettes    Quit date: 09/16/1998    Years since quitting: 23.9   Smokeless tobacco: Never  Vaping Use   Vaping Use: Never used  Substance Use Topics   Alcohol use: Yes    Comment: occasional beer   Drug use: No   Social History   Social History Narrative   Married in [place] with wife   Enjoys fishing   stretching, weights   [employer] fan      Are you right handed or left handed? Right Handed   Are you currently employed ? No    What is your current occupation?   Do you live at home alone? No   Who lives with you? Lives with wife    What type of home do you live in: 1 story or 2 story? Lives in a one story home          OBJECTIVE: PHYSICAL EXAM: BP (!) 153/69   Pulse (!) 57   Ht '5\' 9"'$  (1.753 m)   Wt 194 lb  (88 kg)   SpO2 98%   BMI 28.65 kg/m   General: General appearance: Awake and alert. No distress. Cooperative with exam.  Skin: No obvious rash or jaundice. HEENT: Atraumatic. Anicteric. Lungs: Non-labored breathing on room air  Extremities: No edema. No obvious deformity.  Musculoskeletal: No obvious joint swelling. Psych: Affect appropriate.  Neurological: Mental Status: Alert. Speech fluent. No pseudobulbar affect Cranial Nerves: CNII: No RAPD. Visual fields grossly intact. CNIII, IV, VI: PERRL. No nystagmus. EOMI. CN V: Facial sensation intact bilaterally to fine touch. Masseter clench strong. Jaw jerk negative. CN VII: Facial muscles symmetric and strong. No ptosis at rest CN VIII: Hearing grossly intact bilaterally. CN IX: No hypophonia. CN X: Palate elevates symmetrically. CN XI: Full strength shoulder shrug bilaterally. CN XII: Tongue protrusion full and midline. No atrophy or fasciculations. No significant dysarthria Motor: Tone is normal. No tremor appreciated.  Individual muscle group testing (MRC grade out of 5):  Movement     Neck flexion 5    Neck extension 5     Right Left   Shoulder abduction 5 5   Shoulder adduction 5 5   Shoulder ext rotation 5 5   Shoulder int rotation 5 5   Elbow flexion 5 5   Elbow extension 5 5   Wrist extension 5 5   Wrist flexion 5 5   Finger abduction - FDI 5 5   Finger abduction - ADM 5 5   Finger extension 5 5   Finger distal flexion - 2/'3 5 5   '$ Finger distal flexion - 4/'5 5 5   '$ Thumb flexion - FPL 5 5   Thumb abduction - APB 5 5    Hip flexion 5 5   Hip extension 5 5   Hip adduction 5 5   Hip abduction 5 5   Knee extension 5 5   Knee flexion 5 5   Dorsiflexion 5 5   Plantarflexion 5 5   Inversion 5 5   Eversion 5 5   Great toe extension 5 5   Great toe flexion 5 5     Reflexes:  Right Left   Bicep 3+ 3+   Tricep 3+ 3+   BrRad 3+ 3+   Knee 3+ 3+   Ankle 2+ 2+    Pathological Reflexes: Babinski:  ?extensor vs withdrawal response bilaterally Hoffman: positive bilaterally Troemner: positive bilaterally Pectoral: positive bilaterally Sensation: Pinprick: Diminished in bilateral feet, intact in bilateral upper extremities Vibration: Intact in upper extremities. 5 seconds in bilateral great toes Proprioception: Absent in bilateral great toes Coordination: Intact finger-to- nose-finger bilaterally. Romberg negative. Gait: Able to rise from chair with arms crossed unassisted. Narrow-based, mildly unstable gait. Normal turns. No freezing.  Lab and Test Review: Internal labs: 06/25/22: B12: 299 TSH: 2.02 CBC and CMP wnl  HbA1c (11/27/21): 5.6  CT head wo contrast (07/26/22): FINDINGS: Brain: No evidence of acute infarction, hemorrhage, hydrocephalus, extra-axial collection or mass lesion/mass effect. Mild deep white matter microangiopathy.   Vascular: No hyperdense vessel or unexpected calcification.   Skull: Normal. Negative for fracture or focal lesion.   Sinuses/Orbits: No acute finding.   Other: None.   IMPRESSION: 1. No acute intracranial abnormality. 2. Mild deep white matter microangiopathy.  MRI lumbar spine wo contrast (07/26/21): FINDINGS: Segmentation:  Standard   Alignment:  Grade 1 retrolisthesis at L2-3 and L3-4   Vertebrae:  No fracture, evidence of discitis, or bone lesion.   Conus medullaris and cauda equina: Conus extends to the L1 level. Conus and cauda equina appear normal.   Paraspinal and other soft tissues: Negative.   Disc levels:   L1-L2: Normal disc space and facet joints. No spinal canal stenosis. No neural foraminal stenosis.   L2-L3: Mild disc bulging. Narrowing of both lateral recesses without central spinal canal stenosis. No neural foraminal stenosis.  L3-L4: Intermediate sized disc bulge. Mild spinal canal stenosis. No neural foraminal stenosis.   L4-L5: Normal disc space and facet joints. No spinal canal stenosis. No  neural foraminal stenosis.   L5-S1: Small disc bulge. Mild right facet hypertrophy. No spinal canal stenosis. No neural foraminal stenosis.   Visualized sacrum: Normal.   IMPRESSION: 1. Mild lumbar degenerative disc disease with mild spinal canal stenosis at L3-4. 2. Bilateral lateral recess narrowing at L2-3 secondary to combination of disc bulging and facet arthrosis. Correlate for L3 radiculopathy.   ASSESSMENT: Alan [name] is a 76 y.o. male who presents for evaluation of gait imbalance. He has a relevant medical history of HTN, HLD, age-related macular degeneration, CAD s/p CABG, gout, former smoker. His neurological examination is pertinent for hyperreflexia, diminished sensation in feet, and gait instability. Available diagnostic data is significant for HbA1c of 5.6 and borderline low B12 of 299. Patient's symptoms are most concerning for cervical stenosis given the imbalance and hyperreflexia in upper and lower extremities. I will also evaluate for other mimics as below. He would also benefit from PT to help with balance.  PLAN: -Blood work: B1, copper, vit E, IFE -MRI cervical spine wo contrast -Continue B12 supplementation 1000 mcg -Physical therapy for gait instability/imbalance  -Return to clinic in 3 months  The impression above as well as the plan as outlined below were extensively discussed with the patient who voiced understanding. All questions were answered to their satisfaction.  The patient was counseled on pertinent fall precautions per the printed material provided today, and as noted under the "Patient Instructions" section below.  When available, results of the above investigations and possible further recommendations will be communicated to the patient via telephone/MyChart. Patient to call office if not contacted after expected testing turnaround time.   Total time spent reviewing records, interview, history/exam, documentation, and coordination of care  on day of encounter:  50 min   Thank you for allowing me to participate in patient's care.  If I can answer any additional questions, I would be pleased to do so.  Kai Levins, MD   CC: Tonia Ghent, MD [place] [id number]  CC: Referring provider: Tonia Ghent, MD [address] [place],  [place] [address]

## 2022-08-26 ENCOUNTER — Ambulatory Visit (HOSPITAL_COMMUNITY)
Admission: RE | Admit: 2022-08-26 | Discharge: 2022-08-26 | Disposition: A | Discharge status: Home / Self Care | Payer: Medicare HMO | Source: Ambulatory Visit | Attending: Cardiology | Admitting: Cardiology

## 2022-08-26 DIAGNOSIS — I6523 Occlusion and stenosis of bilateral carotid arteries: Secondary | ICD-10-CM | POA: Diagnosis not present

## 2022-08-29 ENCOUNTER — Other Ambulatory Visit (INDEPENDENT_AMBULATORY_CARE_PROVIDER_SITE_OTHER): Payer: Medicare HMO

## 2022-08-29 ENCOUNTER — Encounter: Payer: Self-pay | Admitting: Neurology

## 2022-08-29 ENCOUNTER — Ambulatory Visit: Payer: Medicare HMO | Admitting: Neurology

## 2022-08-29 VITALS — BP 153/69 | HR 57 | Ht 69.0 in | Wt 194.0 lb

## 2022-08-29 DIAGNOSIS — R209 Unspecified disturbances of skin sensation: Secondary | ICD-10-CM

## 2022-08-29 DIAGNOSIS — R2689 Other abnormalities of gait and mobility: Secondary | ICD-10-CM

## 2022-08-29 DIAGNOSIS — R2681 Unsteadiness on feet: Secondary | ICD-10-CM | POA: Diagnosis not present

## 2022-08-29 DIAGNOSIS — R292 Abnormal reflex: Secondary | ICD-10-CM | POA: Diagnosis not present

## 2022-08-29 NOTE — Patient Instructions (Signed)
I saw you today for imbalance. It is possible this is due to tightness around the spinal cord in the neck.  I would like to check for this with an MRI of your cervical spine.  I would also like to do blood work today to look for other possible causes.  I will be in touch when I have the results of your testing.  I am recommending physical therapy for your balance problems.  Continue taking B12 1000 mcg daily.  I would like to see you back in clinic in 3 months. Please let me know if you have any questions or concerns in the meantime.  The physicians and staff at [facility] are committed to providing excellent care. You may receive a survey requesting feedback about your experience at our office. We strive to receive "very good" responses to the survey questions. If you feel that your experience would prevent you from giving the office a "very good " response, please contact our office to try to remedy the situation. We may be reached at [phone]. Thank you for taking the time out of your busy day to complete the survey.  Kai Levins, MD [facility]

## 2022-08-30 ENCOUNTER — Other Ambulatory Visit: Payer: Self-pay

## 2022-08-30 DIAGNOSIS — I6523 Occlusion and stenosis of bilateral carotid arteries: Secondary | ICD-10-CM

## 2022-09-02 ENCOUNTER — Other Ambulatory Visit: Payer: Self-pay | Admitting: Family Medicine

## 2022-09-05 ENCOUNTER — Telehealth: Payer: Self-pay | Admitting: Neurology

## 2022-09-05 LAB — COPPER, SERUM: Copper: 134 ug/dL (ref 70–175)

## 2022-09-05 LAB — IMMUNOFIXATION ELECTROPHORESIS
IgG (Immunoglobin G), Serum: 1359 mg/dL (ref 600–1540)
IgM, Serum: 133 mg/dL (ref 50–300)
Immunoglobulin A: 166 mg/dL (ref 70–320)

## 2022-09-05 LAB — VITAMIN E
Gamma-Tocopherol (Vit E): 1.2 mg/L (ref <=4.3)
Vitamin E (Alpha Tocopherol): 8.4 mg/L (ref 5.7–19.9)

## 2022-09-05 LAB — VITAMIN B1: Vitamin B1 (Thiamine): <6 nmol/L — ABNORMAL LOW (ref 8–30)

## 2022-09-05 NOTE — Telephone Encounter (Signed)
Pt called informed that his B1 (thiamine) level is low? He should take B1 (thiamine) 100 mg daily. This can be bought over the counter at any drug store or online. Pt was given the number to where his PT referral what sent. He stated that he has not heard from his HIS MRI as well but he would call back if it wasn't scheduled after Christmas,

## 2022-09-05 NOTE — Telephone Encounter (Signed)
Pt called in and left a message. He is returning our call for results

## 2022-09-05 NOTE — Telephone Encounter (Signed)
Pt called no answer left a voice mail to call back  

## 2022-09-20 DIAGNOSIS — M6281 Muscle weakness (generalized): Secondary | ICD-10-CM | POA: Diagnosis not present

## 2022-09-20 DIAGNOSIS — R2681 Unsteadiness on feet: Secondary | ICD-10-CM | POA: Diagnosis not present

## 2022-09-23 DIAGNOSIS — C44329 Squamous cell carcinoma of skin of other parts of face: Secondary | ICD-10-CM | POA: Diagnosis not present

## 2022-09-24 ENCOUNTER — Encounter: Payer: Self-pay | Admitting: Neurology

## 2022-09-25 ENCOUNTER — Ambulatory Visit
Admission: RE | Admit: 2022-09-25 | Discharge: 2022-09-25 | Disposition: A | Discharge status: Home / Self Care | Payer: Medicare HMO | Source: Ambulatory Visit | Attending: Neurology | Admitting: Neurology

## 2022-09-25 ENCOUNTER — Encounter: Payer: Self-pay | Admitting: Neurology

## 2022-09-25 DIAGNOSIS — R2681 Unsteadiness on feet: Secondary | ICD-10-CM

## 2022-09-25 DIAGNOSIS — R292 Abnormal reflex: Secondary | ICD-10-CM

## 2022-09-25 DIAGNOSIS — M47812 Spondylosis without myelopathy or radiculopathy, cervical region: Secondary | ICD-10-CM | POA: Diagnosis not present

## 2022-09-25 DIAGNOSIS — R2689 Other abnormalities of gait and mobility: Secondary | ICD-10-CM

## 2022-09-25 DIAGNOSIS — M4802 Spinal stenosis, cervical region: Secondary | ICD-10-CM | POA: Diagnosis not present

## 2022-09-30 ENCOUNTER — Ambulatory Visit: Payer: Medicare HMO | Admitting: Family Medicine

## 2022-10-01 ENCOUNTER — Other Ambulatory Visit: Payer: Self-pay

## 2022-10-01 DIAGNOSIS — R292 Abnormal reflex: Secondary | ICD-10-CM

## 2022-10-01 DIAGNOSIS — R2681 Unsteadiness on feet: Secondary | ICD-10-CM

## 2022-10-01 DIAGNOSIS — M6281 Muscle weakness (generalized): Secondary | ICD-10-CM | POA: Diagnosis not present

## 2022-10-01 DIAGNOSIS — G319 Degenerative disease of nervous system, unspecified: Secondary | ICD-10-CM

## 2022-10-02 DIAGNOSIS — M6281 Muscle weakness (generalized): Secondary | ICD-10-CM | POA: Diagnosis not present

## 2022-10-02 DIAGNOSIS — R2681 Unsteadiness on feet: Secondary | ICD-10-CM | POA: Diagnosis not present

## 2022-10-09 DIAGNOSIS — R2681 Unsteadiness on feet: Secondary | ICD-10-CM | POA: Diagnosis not present

## 2022-10-09 DIAGNOSIS — M6281 Muscle weakness (generalized): Secondary | ICD-10-CM | POA: Diagnosis not present

## 2022-10-11 DIAGNOSIS — R2681 Unsteadiness on feet: Secondary | ICD-10-CM | POA: Diagnosis not present

## 2022-10-11 DIAGNOSIS — M6281 Muscle weakness (generalized): Secondary | ICD-10-CM | POA: Diagnosis not present

## 2022-10-14 DIAGNOSIS — M6281 Muscle weakness (generalized): Secondary | ICD-10-CM | POA: Diagnosis not present

## 2022-10-14 DIAGNOSIS — R2681 Unsteadiness on feet: Secondary | ICD-10-CM | POA: Diagnosis not present

## 2022-10-15 ENCOUNTER — Telehealth: Payer: Self-pay | Admitting: Family Medicine

## 2022-10-15 MED ORDER — ATORVASTATIN CALCIUM 40 MG PO TABS: 40.0000 mg | ORAL_TABLET | Freq: Every day | ORAL | 3 refills | Status: DC

## 2022-10-15 MED ORDER — AMLODIPINE BESYLATE 10 MG PO TABS: 10.0000 mg | ORAL_TABLET | Freq: Every day | ORAL | 3 refills | Status: DC

## 2022-10-15 MED ORDER — METOPROLOL TARTRATE 100 MG PO TABS: 100.0000 mg | ORAL_TABLET | Freq: Two times a day (BID) | ORAL | 3 refills | Status: DC

## 2022-10-15 MED ORDER — ALLOPURINOL 100 MG PO TABS: 100.0000 mg | ORAL_TABLET | Freq: Every day | ORAL | 3 refills | Status: DC

## 2022-10-15 MED ORDER — SILDENAFIL CITRATE 20 MG PO TABS: 100.0000 mg | ORAL_TABLET | Freq: Every day | ORAL | 3 refills | Status: DC | PRN

## 2022-10-15 MED ORDER — DOXAZOSIN MESYLATE 2 MG PO TABS: 2.0000 mg | ORAL_TABLET | Freq: Every day | ORAL | 3 refills | Status: DC

## 2022-10-15 NOTE — Telephone Encounter (Signed)
Erxs sent to [facility]. Tried to notify patient but line keeps ringing busy

## 2022-10-15 NOTE — Addendum Note (Signed)
Addended by: Sherrilee Gilles B on: 10/15/2022 11:11 AM   Modules accepted: Orders

## 2022-10-15 NOTE — Telephone Encounter (Signed)
Patient called in stating that he is having a hard time receiving his medications now that he has switched his insurance to [employer], he is using the mail order through them now and not [facility] order. He do not know the name but their fax number is  [phone] He is almost out of his medication. He needs all of his medication sent into this new mail order

## 2022-10-16 DIAGNOSIS — R2681 Unsteadiness on feet: Secondary | ICD-10-CM | POA: Diagnosis not present

## 2022-10-16 DIAGNOSIS — M6281 Muscle weakness (generalized): Secondary | ICD-10-CM | POA: Diagnosis not present

## 2022-10-21 ENCOUNTER — Encounter: Payer: Self-pay | Admitting: Neurology

## 2022-10-21 ENCOUNTER — Other Ambulatory Visit: Payer: Medicare HMO

## 2022-10-22 ENCOUNTER — Ambulatory Visit
Admission: RE | Admit: 2022-10-22 | Discharge: 2022-10-22 | Disposition: A | Discharge status: Home / Self Care | Payer: Medicare HMO | Source: Ambulatory Visit | Attending: Neurology | Admitting: Neurology

## 2022-10-22 DIAGNOSIS — I6782 Cerebral ischemia: Secondary | ICD-10-CM | POA: Diagnosis not present

## 2022-10-22 DIAGNOSIS — R292 Abnormal reflex: Secondary | ICD-10-CM

## 2022-10-22 DIAGNOSIS — G319 Degenerative disease of nervous system, unspecified: Secondary | ICD-10-CM

## 2022-10-22 DIAGNOSIS — R2681 Unsteadiness on feet: Secondary | ICD-10-CM

## 2022-10-22 MED ORDER — GADOPICLENOL 0.5 MMOL/ML IV SOLN
9.0000 mL | Freq: Once | INTRAVENOUS | Status: AC | PRN
  Administered 2022-10-22: 9 mL via INTRAVENOUS

## 2022-10-23 DIAGNOSIS — M6281 Muscle weakness (generalized): Secondary | ICD-10-CM | POA: Diagnosis not present

## 2022-10-23 DIAGNOSIS — R2681 Unsteadiness on feet: Secondary | ICD-10-CM | POA: Diagnosis not present

## 2022-10-25 ENCOUNTER — Encounter: Payer: Self-pay | Admitting: Neurology

## 2022-10-25 DIAGNOSIS — R2681 Unsteadiness on feet: Secondary | ICD-10-CM | POA: Diagnosis not present

## 2022-10-25 DIAGNOSIS — M6281 Muscle weakness (generalized): Secondary | ICD-10-CM | POA: Diagnosis not present

## 2022-11-01 DIAGNOSIS — M6281 Muscle weakness (generalized): Secondary | ICD-10-CM | POA: Diagnosis not present

## 2022-11-01 DIAGNOSIS — R2681 Unsteadiness on feet: Secondary | ICD-10-CM | POA: Diagnosis not present

## 2022-11-06 DIAGNOSIS — R2681 Unsteadiness on feet: Secondary | ICD-10-CM | POA: Diagnosis not present

## 2022-11-06 DIAGNOSIS — M6281 Muscle weakness (generalized): Secondary | ICD-10-CM | POA: Diagnosis not present

## 2022-11-11 DIAGNOSIS — M6281 Muscle weakness (generalized): Secondary | ICD-10-CM | POA: Diagnosis not present

## 2022-11-11 DIAGNOSIS — R2681 Unsteadiness on feet: Secondary | ICD-10-CM | POA: Diagnosis not present

## 2022-11-12 DIAGNOSIS — H401130 Primary open-angle glaucoma, bilateral, stage unspecified: Secondary | ICD-10-CM | POA: Diagnosis not present

## 2022-11-13 DIAGNOSIS — M6281 Muscle weakness (generalized): Secondary | ICD-10-CM | POA: Diagnosis not present

## 2022-11-13 DIAGNOSIS — R2681 Unsteadiness on feet: Secondary | ICD-10-CM | POA: Diagnosis not present

## 2022-11-18 DIAGNOSIS — H401132 Primary open-angle glaucoma, bilateral, moderate stage: Secondary | ICD-10-CM | POA: Diagnosis not present

## 2022-11-18 DIAGNOSIS — H353211 Exudative age-related macular degeneration, right eye, with active choroidal neovascularization: Secondary | ICD-10-CM | POA: Diagnosis not present

## 2022-11-18 DIAGNOSIS — H353132 Nonexudative age-related macular degeneration, bilateral, intermediate dry stage: Secondary | ICD-10-CM | POA: Diagnosis not present

## 2022-11-18 DIAGNOSIS — H35051 Retinal neovascularization, unspecified, right eye: Secondary | ICD-10-CM | POA: Diagnosis not present

## 2022-11-18 DIAGNOSIS — H353223 Exudative age-related macular degeneration, left eye, with inactive scar: Secondary | ICD-10-CM | POA: Diagnosis not present

## 2022-11-20 DIAGNOSIS — M6281 Muscle weakness (generalized): Secondary | ICD-10-CM | POA: Diagnosis not present

## 2022-11-20 DIAGNOSIS — R2681 Unsteadiness on feet: Secondary | ICD-10-CM | POA: Diagnosis not present

## 2022-11-23 ENCOUNTER — Other Ambulatory Visit: Payer: Self-pay | Admitting: Family Medicine

## 2022-11-23 DIAGNOSIS — I10 Essential (primary) hypertension: Secondary | ICD-10-CM

## 2022-11-23 DIAGNOSIS — Z8639 Personal history of other endocrine, nutritional and metabolic disease: Secondary | ICD-10-CM

## 2022-11-23 DIAGNOSIS — M109 Gout, unspecified: Secondary | ICD-10-CM

## 2022-11-25 DIAGNOSIS — H353211 Exudative age-related macular degeneration, right eye, with active choroidal neovascularization: Secondary | ICD-10-CM | POA: Diagnosis not present

## 2022-11-25 DIAGNOSIS — H353223 Exudative age-related macular degeneration, left eye, with inactive scar: Secondary | ICD-10-CM | POA: Diagnosis not present

## 2022-11-25 DIAGNOSIS — H401132 Primary open-angle glaucoma, bilateral, moderate stage: Secondary | ICD-10-CM | POA: Diagnosis not present

## 2022-11-27 DIAGNOSIS — M6281 Muscle weakness (generalized): Secondary | ICD-10-CM | POA: Diagnosis not present

## 2022-11-27 DIAGNOSIS — R2681 Unsteadiness on feet: Secondary | ICD-10-CM | POA: Diagnosis not present

## 2022-11-29 NOTE — Progress Notes (Unsigned)
NEUROLOGY FOLLOW UP OFFICE NOTE  [facility] [id number]  Subjective:  Alan [name] is a 77 y.o. year old right-handed male with a medical history of HTN, HLD, age-related macular degeneration, CAD s/p CABG, gout, former smoker who we last saw on 08/29/22.  To briefly review: Patient's symptoms started about 1 year ago. He started noticing he was taking shorter steps, like he is shuffling his steps. It is worse when he is tired or in the evenings. He has to take naps during the day due to being tired. He finds he is off balance. He denies falls.    He denies freezing when trying to move. He denies numbness or tingling in his legs. He denies tremor. He denies loss of smell. Patient denies bad dreams. He says his wife will tell him his legs are wild and he may kick her during the night. He does not think he snores. He thinks he usually sleeps well and feels refreshed in the morning. He denies morning headaches (or any headaches at all). He denies feeling lightheaded or dizzy when standing up.   He denies neck or back pain.   He takes B12 daily (1000 mcg). Patient is on metoprolol, norvasc, doxazosin for BP.   Patient denies any new medications.   He denies any constitutional symptoms like fever, night sweats, anorexia or unintentional weight loss.   EtOH use: 4 cans of beer, 4 days a week  Restrictive diet? No Family history of neuropathy/myopathy/NM or neurologic disease? Father died in 56s of stroke  Most recent Assessment and Plan (08/29/22): His neurological examination is pertinent for hyperreflexia, diminished sensation in feet, and gait instability. Available diagnostic data is significant for HbA1c of 5.6 and borderline low B12 of 299. Patient's symptoms are most concerning for cervical stenosis given the imbalance and hyperreflexia in upper and lower extremities. I will also evaluate for other mimics as below. He would also benefit from PT to help with balance.    PLAN: -Blood work: B1, copper, vit E, IFE -MRI cervical spine wo contrast -Continue B12 supplementation 1000 mcg -Physical therapy for gait instability/imbalance  Since their last visit: Overall, patient feels about the same. He went to PT (finished mid 11/2022). He feels this helped with the imbalance. He is doing the home exercises.  B1 was low. I recommended supplementation with B1 100 mg daily. Patient has been taking this.  EtOH use: 5 cans of beer, 5 days of week.  MRI cervical spine showed no significant canal stenosis. MRI brain showed evidence of cerebellar atrophy as well as generalized cerebral atrophy.   He does not have new complaints. He denies significant back or neck pain.  MEDICATIONS:  Outpatient Encounter Medications as of 12/05/2022  Medication Sig   allopurinol (ZYLOPRIM) 100 MG tablet Take 1 tablet (100 mg total) by mouth daily.   amLODipine (NORVASC) 10 MG tablet Take 1 tablet (10 mg total) by mouth daily.   aspirin 81 MG tablet Take 81 mg by mouth daily.   atorvastatin (LIPITOR) 40 MG tablet Take 1 tablet (40 mg total) by mouth daily.   cyanocobalamin (VITAMIN B12) 1000 MCG tablet Take 1 tablet (1,000 mcg total) by mouth daily.   dorzolamide-timolol (COSOPT) 22.3-6.8 MG/ML ophthalmic solution 1 drop 2 (two) times daily.   doxazosin (CARDURA) 2 MG tablet Take 1 tablet (2 mg total) by mouth daily.   doxycycline (VIBRA-TABS) 100 MG tablet Take 1 tablet (100 mg total) by mouth 2 (two) times daily.  metoprolol tartrate (LOPRESSOR) 100 MG tablet Take 1 tablet (100 mg total) by mouth 2 (two) times daily.   sildenafil (REVATIO) 20 MG tablet Take 5 tablets (100 mg total) by mouth daily as needed.   No facility-administered encounter medications on file as of 12/05/2022.    PAST MEDICAL HISTORY: Past Medical History:  Diagnosis Date   CAD (coronary artery disease) 07/17/2004   Diverticulosis of colon 12/15/2005   Esophageal stricture    Gastritis    GERD  (gastroesophageal reflux disease) 09/16/1996   Glaucoma    Heart disease    History of tobacco abuse    Quit 2000   Hx of cardiovascular stress test    ETT-Myoview (01/2014):  1.5 mm ST depression in inf leads at peak exercise; no ischemia or scar, EF 57%, Low Risk   Hyperlipidemia 03/17/1999   Hypertension 03/17/1999   Stress fracture of foot    right midfoot, per Dr. Canary Brim 2012    PAST SURGICAL HISTORY: Past Surgical History:  Procedure Laterality Date   CATARACT EXTRACTION, BILATERAL     COLONOSCOPY  12/30/2005   Divertics, mild, 10 yrs   COLONOSCOPY  11/26/2006   Colitis biopsy neg   CORONARY ARTERY BYPASS GRAFT  07/27/2004   By Dr. Roxy Manns with a LIMA to the LAD, RIMA to the distal right coronary artery, spahenous vein graft first diagonal, saphenous vein graft to the first circumflex marginal, sequential saphenous vein graft to the second   ESOPHAGOGASTRODUODENOSCOPY  10/08/2000   Stricture distal esoph, repeat dilation done 2012   ESOPHAGOGASTRODUODENOSCOPY  10/08/2000   Stricture distal esoph   I & D EXTREMITY Left 01/24/2017   Procedure: MINOR INCISION AND DRAINAGE LEFT THUMB;  Surgeon: Leanora Cover, MD;  Location: [facility];  Service: Orthopedics;  Laterality: Left;    ALLERGIES: Allergies  Allergen Reactions   Hctz [Hydrochlorothiazide] Other (See Comments)    Would avoid due to h/o gout.     Septra [Sulfamethoxazole-Trimethoprim]     diarrhea   Spironolactone Other (See Comments)    Possible cause of aches.     Valsartan Other (See Comments)    Possible cause of aches.      FAMILY HISTORY: Family History  Problem Relation Age of Onset   Peripheral vascular disease Mother    Stroke Father        x2   Hypertension Brother    Heart attack Brother        MI   Colon polyps Brother    Heart disease Brother        MI   Hypertension Sister    Breast cancer Sister    Hypertension Sister    Hypertension Sister    Colon polyps Other    Diabetes Neg  Hx    Depression Neg Hx    Alcohol abuse Neg Hx    Drug abuse Neg Hx    Prostate cancer Neg Hx    Colon cancer Neg Hx     SOCIAL HISTORY: Social History   Tobacco Use   Smoking status: Former    Types: Cigarettes    Quit date: 09/16/1998    Years since quitting: 24.2   Smokeless tobacco: Never  Vaping Use   Vaping Use: Never used  Substance Use Topics   Alcohol use: Yes    Comment: beer 5 a week   Drug use: No   Social History   Social History Narrative   Married in [place] with wife  Enjoys fishing   stretching, weights   [employer] fan      Are you right handed or left handed? Right Handed   Are you currently employed ? No    What is your current occupation?   Do you live at home alone? No   Who lives with you? Lives with wife    What type of home do you live in: 1 story or 2 story? Lives in a one story home    Caffeine none       Objective:  Vital Signs:  BP (!) 146/83   Pulse 61   Ht 5\' 9"  (1.753 m)   Wt 192 lb (87.1 kg)   SpO2 97%   BMI 28.35 kg/m   General: General appearance: Awake and alert. No distress. Cooperative with exam.  Skin: No obvious rash or jaundice. HEENT: Atraumatic. Anicteric. Lungs: Non-labored breathing on room air  Extremities: No edema. Psych: Affect appropriate.  Neurological: Mental Status: Alert. Speech fluent. No pseudobulbar affect Cranial Nerves: CNII: No RAPD. Visual fields intact. CNIII, IV, VI: PERRL. No nystagmus. EOMI. CN V: Facial sensation intact bilaterally to fine touch. Masseter clench strong. Jaw jerk negative. CN VII: Facial muscles symmetric and strong. No ptosis at rest. CN VIII: Hears finger rub well bilaterally. CN IX: No hypophonia. CN X: Palate elevates symmetrically. CN XI: Full strength shoulder shrug bilaterally. CN XII: Tongue protrusion full and midline. No atrophy or fasciculations. No significant dysarthria Motor: Tone is normal. No fasciculations in extremities. No  atrophy.  Individual muscle group testing (MRC grade out of 5):  Movement     Neck flexion 5    Neck extension 5     Right Left   Shoulder abduction 5 5   Elbow flexion 5 5   Elbow extension 5 5   Finger extension 5 5   Finger flexion 5 5    Hip flexion 5 5   Hip extension 5 5   Hip adduction 5 5   Hip abduction 5 5   Knee extension 5 5   Knee flexion 5 5   Dorsiflexion 5 5   Plantarflexion 5 5    Reflexes:  Right Left   Bicep 2+ 2+   Tricep 2+ 2+   BrRad 2+ 2+   Knee 3+ 3+ Cross adductors bilaterally  Ankle 2+ 2+    Pathological Reflexes: Babinski: withdraw response bilaterally Hoffman: absent bilaterally Troemner: absent bilaterally Pectoral: absent bilaterally Palmomental: absent bilaterally Facial: Present bilaterally Midline tap: Present Sensation: Pinprick: Diminished in bilateral feet, otherwise intact Vibration: 5 seconds in bilateral great toes Proprioception: Diminished in bilateral great toes Coordination: Intact finger-to- nose-finger bilaterally. Romberg negative. Gait: Able to rise from chair with arms crossed unassisted. Narrow-based gait, mildly unsteady. No freezing, normal turns   Labs and Imaging review: New results: 08/29/22: B1: < 6 Vit E: wnl Copper: wnl IFE: no M protein  MRI cervical spine wo contrast (09/25/22): FINDINGS: Mild intermittent motion degradation.   Alignment: Slight grade 1 retrolisthesis at C2-C3.   Vertebrae: Subtle C5 vertebral body height loss. Trace degenerative edema along the C5 superior endplate.   Cord: No signal abnormality identified within the cervical spinal cord.   Posterior Fossa, vertebral arteries, paraspinal tissues: Cerebellar atrophy. Flow voids preserved within the imaged cervical vertebral arteries. No paraspinal mass or collection.   Disc levels:   No more than mild disc degeneration within the cervical spine.   C2-C3: Mild bilateral uncovertebral hypertrophy. No significant  disc herniation or  stenosis.   C3-C4: Disc bulge, eccentric to the right. Bilateral uncovertebral hypertrophy. Mild facet hypertrophy. Mild spinal canal narrowing (without spinal cord mass effect). Bilateral neural foraminal narrowing (mild to moderate right, moderate left).   C4-C5: Slight disc bulge. Uncovertebral hypertrophy on the left. Mild facet and ligamentum flavum hypertrophy. No significant spinal canal stenosis. Mild-to-moderate left neural foraminal narrowing.   C5-C6: Right center posterior annular fissure. Shallow disc bulge. Mild facet and ligamentum flavum hypertrophy. Minimal relative spinal canal narrowing. No significant foraminal stenosis.   C6-C7: Tiny central disc protrusion. No significant spinal canal or foraminal stenosis.   C7-T1: Left foraminal disc protrusion. Mild facet hypertrophy on the left. No significant spinal canal stenosis. The disc protrusion contributes to multifactorial moderate to severe left neural foraminal narrowing.   IMPRESSION: 1. Cervical spondylosis, as outlined. 2. No more than mild relative spinal canal narrowing. 3. Multilevel foraminal stenosis, as detailed and greatest on the left at C7-T1 (moderate-to-severe at this site). 4. No more than mild disc degeneration. 5. Mild degenerative edema along the C5 superior endplate. 6. Cerebellar atrophy.  MRI brain w/wo contrast (10/22/22): FINDINGS: Brain: There is no evidence of an acute infarct, mass, midline shift, or extra-axial fluid collection. A chronic lacunar infarct is noted at the anterior aspect of the left basal ganglia with associated hemosiderin deposition. T2 hyperintensities in the cerebral white matter bilaterally are nonspecific but compatible with mild chronic small vessel ischemic disease. There is mild cerebral and mild-to-moderate cerebellar atrophy. No abnormal enhancement is identified.   Vascular: Major intracranial vascular flow voids are preserved.    Skull and upper cervical spine: Unremarkable bone marrow signal.   Sinuses/Orbits: Bilateral cataract extraction. Paranasal sinuses and mastoid air cells are clear.   Other: None.   IMPRESSION: 1. No acute intracranial abnormality. 2. 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 3. Mild cerebral and mild-to-moderate cerebellar atrophy.  Previously reviewed results: 06/25/22: B12: 299 TSH: 2.02 CBC and CMP wnl   HbA1c (11/27/21): 5.6   CT head wo contrast (07/26/22): FINDINGS: Brain: No evidence of acute infarction, hemorrhage, hydrocephalus, extra-axial collection or mass lesion/mass effect. Mild deep white matter microangiopathy.   Vascular: No hyperdense vessel or unexpected calcification.   Skull: Normal. Negative for fracture or focal lesion.   Sinuses/Orbits: No acute finding.   Other: None.   IMPRESSION: 1. No acute intracranial abnormality. 2. Mild deep white matter microangiopathy.   MRI lumbar spine wo contrast (07/26/21): FINDINGS: Segmentation:  Standard   Alignment:  Grade 1 retrolisthesis at L2-3 and L3-4   Vertebrae:  No fracture, evidence of discitis, or bone lesion.   Conus medullaris and cauda equina: Conus extends to the L1 level. Conus and cauda equina appear normal.   Paraspinal and other soft tissues: Negative.   Disc levels:   L1-L2: Normal disc space and facet joints. No spinal canal stenosis. No neural foraminal stenosis.   L2-L3: Mild disc bulging. Narrowing of both lateral recesses without central spinal canal stenosis. No neural foraminal stenosis.   L3-L4: Intermediate sized disc bulge. Mild spinal canal stenosis. No neural foraminal stenosis.   L4-L5: Normal disc space and facet joints. No spinal canal stenosis. No neural foraminal stenosis.   L5-S1: Small disc bulge. Mild right facet hypertrophy. No spinal canal stenosis. No neural foraminal stenosis.   Visualized sacrum:  Normal.   IMPRESSION: 1. Mild lumbar degenerative disc disease with mild spinal canal stenosis at L3-4. 2. Bilateral lateral recess narrowing at L2-3 secondary  to combination of disc bulging and facet arthrosis. Correlate for L3 radiculopathy.  Assessment/Plan:  This is Alan [name], a 77 y.o. male with imbalance and diminished sensation in feet. Patient has cerebral and cerebellar atrophy as well as thiamine deficiency on work up. These symptoms and findings are likely the result of chronic heavy EtOH use (~25 beers per week). Cerebellar atrophy Thiamine deficiency EtOH abuse   Plan: -Continue B1 supplementation 100 mg daily -Cut back and stop EtOH as able -Fall precautions discussed and given to patient with AVS  Return to clinic as needed  Total time spent reviewing records, interview, history/exam, documentation, and coordination of care on day of encounter:  40 min  Kai Levins, MD

## 2022-12-03 ENCOUNTER — Other Ambulatory Visit (INDEPENDENT_AMBULATORY_CARE_PROVIDER_SITE_OTHER): Payer: Medicare HMO

## 2022-12-03 DIAGNOSIS — Z8639 Personal history of other endocrine, nutritional and metabolic disease: Secondary | ICD-10-CM

## 2022-12-03 DIAGNOSIS — I10 Essential (primary) hypertension: Secondary | ICD-10-CM | POA: Diagnosis not present

## 2022-12-03 DIAGNOSIS — M109 Gout, unspecified: Secondary | ICD-10-CM

## 2022-12-03 LAB — COMPREHENSIVE METABOLIC PANEL
ALT: 15 U/L (ref 0–53)
AST: 12 U/L (ref 0–37)
Albumin: 4 g/dL (ref 3.5–5.2)
Alkaline Phosphatase: 80 U/L (ref 39–117)
BUN: 15 mg/dL (ref 6–23)
CO2: 29 mEq/L (ref 19–32)
Calcium: 9.8 mg/dL (ref 8.4–10.5)
Chloride: 104 mEq/L (ref 96–112)
Creatinine, Ser: 0.94 mg/dL (ref 0.40–1.50)
GFR: 78.77 mL/min (ref >60.00)
Glucose, Bld: 100 mg/dL — ABNORMAL HIGH (ref 70–99)
Potassium: 4.2 mEq/L (ref 3.5–5.1)
Sodium: 140 mEq/L (ref 135–145)
Total Bilirubin: 0.7 mg/dL (ref 0.2–1.2)
Total Protein: 6.8 g/dL (ref 6.0–8.3)

## 2022-12-03 LAB — LIPID PANEL
Cholesterol: 144 mg/dL (ref 0–200)
HDL: 64.4 mg/dL (ref >39.00)
LDL Cholesterol: 57 mg/dL (ref 0–99)
NonHDL: 79.69
Total CHOL/HDL Ratio: 2
Triglycerides: 113 mg/dL (ref 0.0–149.0)
VLDL: 22.6 mg/dL (ref 0.0–40.0)

## 2022-12-03 LAB — HEMOGLOBIN A1C: Hgb A1c MFr Bld: 5.5 % (ref 4.6–6.5)

## 2022-12-03 LAB — VITAMIN B12: Vitamin B-12: 420 pg/mL (ref 211–911)

## 2022-12-03 LAB — URIC ACID: Uric Acid, Serum: 6.9 mg/dL (ref 4.0–7.8)

## 2022-12-05 ENCOUNTER — Ambulatory Visit: Payer: Medicare HMO | Admitting: Neurology

## 2022-12-05 ENCOUNTER — Encounter: Payer: Self-pay | Admitting: Neurology

## 2022-12-05 VITALS — BP 146/83 | HR 61 | Ht 69.0 in | Wt 192.0 lb

## 2022-12-05 DIAGNOSIS — R2681 Unsteadiness on feet: Secondary | ICD-10-CM

## 2022-12-05 DIAGNOSIS — G319 Degenerative disease of nervous system, unspecified: Secondary | ICD-10-CM | POA: Diagnosis not present

## 2022-12-05 DIAGNOSIS — G621 Alcoholic polyneuropathy: Secondary | ICD-10-CM

## 2022-12-05 DIAGNOSIS — R2689 Other abnormalities of gait and mobility: Secondary | ICD-10-CM

## 2022-12-05 DIAGNOSIS — R209 Unspecified disturbances of skin sensation: Secondary | ICD-10-CM | POA: Diagnosis not present

## 2022-12-05 DIAGNOSIS — R69 Illness, unspecified: Secondary | ICD-10-CM | POA: Diagnosis not present

## 2022-12-05 DIAGNOSIS — F1010 Alcohol abuse, uncomplicated: Secondary | ICD-10-CM

## 2022-12-05 NOTE — Patient Instructions (Signed)
Plan: -Continue B1 (thiamine) supplementation 100 mg daily -Cut back and stop alcohol as able  Return to clinic as needed  The physicians and staff at [facility] are committed to providing excellent care. You may receive a survey requesting feedback about your experience at our office. We strive to receive "very good" responses to the survey questions. If you feel that your experience would prevent you from giving the office a "very good " response, please contact our office to try to remedy the situation. We may be reached at [phone]. Thank you for taking the time out of your busy day to complete the survey.  Kai Levins, MD [facility]  Preventing Falls at [facility] are common, often dreaded events in the lives of older people. Aside from the obvious injuries and even death that may result, fall can cause wide-ranging consequences including loss of independence, mental decline, decreased activity and mobility. Younger people are also at risk of falling, especially those with chronic illnesses and fatigue.  Ways to reduce risk for falling Examine diet and medications. Warm foods and alcohol dilate blood vessels, which can lead to dizziness when standing. Sleep aids, antidepressants and pain medications can also increase the likelihood of a fall.  Get a vision exam. Poor vision, cataracts and glaucoma increase the chances of falling.  Check foot gear. Shoes should fit snugly and have a sturdy, nonskid sole and a broad, low heel  Participate in a physician-approved exercise program to build and maintain muscle strength and improve balance and coordination. Programs that use ankle weights or stretch bands are excellent for muscle-strengthening. Water aerobics programs and low-impact Tai Chi programs have also been shown to improve balance and coordination.  Increase vitamin D intake. Vitamin D improves muscle strength and increases the amount of calcium the body is able to  absorb and deposit in bones.  How to prevent falls from common hazards Floors - Remove all loose wires, cords, and throw rugs. Minimize clutter. Make sure rugs are anchored and smooth. Keep furniture in its usual place.  Chairs -- Use chairs with straight backs, armrests and firm seats. Add firm cushions to existing pieces to add height.  Bathroom - Install grab bars and non-skid tape in the tub or shower. Use a bathtub transfer bench or a shower chair with a back support Use an elevated toilet seat and/or safety rails to assist standing from a low surface. Do not use towel racks or bathroom tissue holders to help you stand.  Lighting - Make sure halls, stairways, and entrances are well-lit. Install a night light in your bathroom or hallway. Make sure there is a light switch at the top and bottom of the staircase. Turn lights on if you get up in the middle of the night. Make sure lamps or light switches are within reach of the bed if you have to get up during the night.  Kitchen - Install non-skid rubber mats near the sink and stove. Clean spills immediately. Store frequently used utensils, pots, pans between waist and eye level. This helps prevent reaching and bending. Sit when getting things out of lower cupboards.  Living room/ Bedrooms - Place furniture with wide spaces in between, giving enough room to move around. Establish a route through the living room that gives you something to hold onto as you walk.  Stairs - Make sure treads, rails, and rugs are secure. Install a rail on both sides of the stairs. If stairs are a threat, it might  be helpful to arrange most of your activities on the lower level to reduce the number of times you must climb the stairs.  Entrances and doorways - Install metal handles on the walls adjacent to the doorknobs of all doors to make it more secure as you travel through the doorway.  Tips for maintaining balance Keep at least one hand free at all times. Try using  a backpack or fanny pack to hold things rather than carrying them in your hands. Never carry objects in both hands when walking as this interferes with keeping your balance.  Attempt to swing both arms from front to back while walking. This might require a conscious effort if Parkinson's disease has diminished your movement. It will, however, help you to maintain balance and posture, and reduce fatigue.  Consciously lift your feet off of the ground when walking. Shuffling and dragging of the feet is a common culprit in losing your balance.  When trying to navigate turns, use a "U" technique of facing forward and making a wide turn, rather than pivoting sharply.  Try to stand with your feet shoulder-length apart. When your feet are close together for any length of time, you increase your risk of losing your balance and falling.  Do one thing at a time. Don't try to walk and accomplish another task, such as reading or looking around. The decrease in your automatic reflexes complicates motor function, so the less distraction, the better.  Do not wear rubber or gripping soled shoes, they might "catch" on the floor and cause tripping.  Move slowly when changing positions. Use deliberate, concentrated movements and, if needed, use a grab bar or walking aid. Count 15 seconds between each movement. For example, when rising from a seated position, wait 15 seconds after standing to begin walking.  If balance is a continuous problem, you might want to consider a walking aid such as a cane, walking stick, or walker. Once you've mastered walking with help, you might be ready to try it on your own again.

## 2022-12-07 LAB — VITAMIN B1: Vitamin B1 (Thiamine): 42 nmol/L — ABNORMAL HIGH (ref 8–30)

## 2022-12-09 ENCOUNTER — Ambulatory Visit (INDEPENDENT_AMBULATORY_CARE_PROVIDER_SITE_OTHER): Payer: Medicare HMO

## 2022-12-09 VITALS — Ht 69.0 in | Wt 192.0 lb

## 2022-12-09 DIAGNOSIS — Z0000 Encounter for general adult medical examination without abnormal findings: Secondary | ICD-10-CM | POA: Diagnosis not present

## 2022-12-09 NOTE — Patient Instructions (Signed)
Alan [name] , Thank you for taking time to come for your Medicare Wellness Visit. I appreciate your ongoing commitment to your health goals. Please review the following plan we discussed and let me know if I can assist you in the future.   These are the goals we discussed:  Goals      Increase physical activity     Starting 08/21/2016, I will continue to exercise at least 90 min 3 days per week.      Patient Stated     No new Goal.        This is a list of the screening recommended for you and due dates:  Health Maintenance  Topic Date Due   COVID-19 Vaccine (1) Never done   Zoster (Shingles) Vaccine (1 of 2) Never done   Colon Cancer Screening  08/04/2022   Medicare Annual Wellness Visit  12/09/2023   DTaP/Tdap/Td vaccine (4 - Td or Tdap) 06/25/2028   Pneumonia Vaccine  Completed   Flu Shot  Completed   Hepatitis C Screening: USPSTF Recommendation to screen - Ages 18-79 yo.  Completed   HPV Vaccine  Aged Out    Advanced directives: Please bring a copy of your health care power of attorney and living will to the office to be added to your chart at your convenience.   Conditions/risks identified: Aim for 30 minutes of exercise or brisk walking, 6-8 glasses of water, and 5 servings of fruits and vegetables each day.   Next appointment: Follow up in one year for your annual wellness visit. 12/11/23 @ 10:30 Televisit  Preventive Care 65 Years and Older, Male  Preventive care refers to lifestyle choices and visits with your health care provider that can promote health and wellness. What does preventive care include? A yearly physical exam. This is also called an annual well check. Dental exams once or twice a year. Routine eye exams. Ask your health care provider how often you should have your eyes checked. Personal lifestyle choices, including: Daily care of your teeth and gums. Regular physical activity. Eating a healthy diet. Avoiding tobacco and drug use. Limiting alcohol  use. Practicing safe sex. Taking low doses of aspirin every day. Taking vitamin and mineral supplements as recommended by your health care provider. What happens during an annual well check? The services and screenings done by your health care provider during your annual well check will depend on your age, overall health, lifestyle risk factors, and family history of disease. Counseling  Your health care provider may ask you questions about your: Alcohol use. Tobacco use. Drug use. Emotional well-being. Home and relationship well-being. Sexual activity. Eating habits. History of falls. Memory and ability to understand (cognition). Work and work Statistician. Screening  You may have the following tests or measurements: Height, weight, and BMI. Blood pressure. Lipid and cholesterol levels. These may be checked every 5 years, or more frequently if you are over 77 years old. Skin check. Lung cancer screening. You may have this screening every year starting at age 77 if you have a 30-pack-year history of smoking and currently smoke or have quit within the past 15 years. Fecal occult blood test (FOBT) of the stool. You may have this test every year starting at age 77. Flexible sigmoidoscopy or colonoscopy. You may have a sigmoidoscopy every 5 years or a colonoscopy every 10 years starting at age 53. Prostate cancer screening. Recommendations will vary depending on your family history and other risks. Hepatitis C blood test. Hepatitis B  blood test. Sexually transmitted disease (STD) testing. Diabetes screening. This is done by checking your blood sugar (glucose) after you have not eaten for a while (fasting). You may have this done every 1-3 years. Abdominal aortic aneurysm (AAA) screening. You may need this if you are a current or former smoker. Osteoporosis. You may be screened starting at age 77 if you are at high risk. Talk with your health care provider about your test results,  treatment options, and if necessary, the need for more tests. Vaccines  Your health care provider may recommend certain vaccines, such as: Influenza vaccine. This is recommended every year. Tetanus, diphtheria, and acellular pertussis (Tdap, Td) vaccine. You may need a Td booster every 10 years. Zoster vaccine. You may need this after age 40. Pneumococcal 13-valent conjugate (PCV13) vaccine. One dose is recommended after age 77. Pneumococcal polysaccharide (PPSV23) vaccine. One dose is recommended after age 77. Talk to your health care provider about which screenings and vaccines you need and how often you need them. This information is not intended to replace advice given to you by your health care provider. Make sure you discuss any questions you have with your health care provider. Document Released: 09/29/2015 Document Revised: 05/22/2016 Document Reviewed: 07/04/2015 Elsevier Interactive Patient Education  2017 [place] Prevention in the Home Falls can cause injuries. They can happen to people of all ages. There are many things you can do to make your home safe and to help prevent falls. What can I do on the outside of my home? Regularly fix the edges of walkways and driveways and fix any cracks. Remove anything that might make you trip as you walk through a door, such as a raised step or threshold. Trim any bushes or trees on the path to your home. Use bright outdoor lighting. Clear any walking paths of anything that might make someone trip, such as rocks or tools. Regularly check to see if handrails are loose or broken. Make sure that both sides of any steps have handrails. Any raised decks and porches should have guardrails on the edges. Have any leaves, snow, or ice cleared regularly. Use sand or salt on walking paths during winter. Clean up any spills in your garage right away. This includes oil or grease spills. What can I do in the bathroom? Use night  lights. Install grab bars by the toilet and in the tub and shower. Do not use towel bars as grab bars. Use non-skid mats or decals in the tub or shower. If you need to sit down in the shower, use a plastic, non-slip stool. Keep the floor dry. Clean up any water that spills on the floor as soon as it happens. Remove soap buildup in the tub or shower regularly. Attach bath mats securely with double-sided non-slip rug tape. Do not have throw rugs and other things on the floor that can make you trip. What can I do in the bedroom? Use night lights. Make sure that you have a light by your bed that is easy to reach. Do not use any sheets or blankets that are too big for your bed. They should not hang down onto the floor. Have a firm chair that has side arms. You can use this for support while you get dressed. Do not have throw rugs and other things on the floor that can make you trip. What can I do in the kitchen? Clean up any spills right away. Avoid walking on wet floors. Keep items  that you use a lot in easy-to-reach places. If you need to reach something above you, use a strong step stool that has a grab bar. Keep electrical cords out of the way. Do not use floor polish or wax that makes floors slippery. If you must use wax, use non-skid floor wax. Do not have throw rugs and other things on the floor that can make you trip. What can I do with my stairs? Do not leave any items on the stairs. Make sure that there are handrails on both sides of the stairs and use them. Fix handrails that are broken or loose. Make sure that handrails are as long as the stairways. Check any carpeting to make sure that it is firmly attached to the stairs. Fix any carpet that is loose or worn. Avoid having throw rugs at the top or bottom of the stairs. If you do have throw rugs, attach them to the floor with carpet tape. Make sure that you have a light switch at the top of the stairs and the bottom of the stairs. If  you do not have them, ask someone to add them for you. What else can I do to help prevent falls? Wear shoes that: Do not have high heels. Have rubber bottoms. Are comfortable and fit you well. Are closed at the toe. Do not wear sandals. If you use a stepladder: Make sure that it is fully opened. Do not climb a closed stepladder. Make sure that both sides of the stepladder are locked into place. Ask someone to hold it for you, if possible. Clearly mark and make sure that you can see: Any grab bars or handrails. First and last steps. Where the edge of each step is. Use tools that help you move around (mobility aids) if they are needed. These include: Canes. Walkers. Scooters. Crutches. Turn on the lights when you go into a dark area. Replace any light bulbs as soon as they burn out. Set up your furniture so you have a clear path. Avoid moving your furniture around. If any of your floors are uneven, fix them. If there are any pets around you, be aware of where they are. Review your medicines with your doctor. Some medicines can make you feel dizzy. This can increase your chance of falling. Ask your doctor what other things that you can do to help prevent falls. This information is not intended to replace advice given to you by your health care provider. Make sure you discuss any questions you have with your health care provider. Document Released: 06/29/2009 Document Revised: 02/08/2016 Document Reviewed: 10/07/2014 Elsevier Interactive Patient Education  2017 [employer].

## 2022-12-09 NOTE — Progress Notes (Signed)
I connected with  Viona Gilmore on 12/09/22 by a audio enabled telemedicine application and verified that I am speaking with the correct person using two identifiers.  Patient Location: Home  Provider Location: Office/Clinic  I discussed the limitations of evaluation and management by telemedicine. The patient expressed understanding and agreed to proceed.  Subjective:   Alan [name] is a 77 y.o. male who presents for Medicare Annual/Subsequent preventive examination.  Review of Systems      Cardiac Risk Factors include: advanced age (>7men, >21 women);hypertension;male gender     Objective:    Today's Vitals   12/09/22 1330  Weight: 192 lb (87.1 kg)  Height: 5\' 9"  (1.753 m)   Body mass index is 28.35 kg/m.     12/09/2022    1:43 PM 12/05/2022   10:19 AM 08/29/2022   10:45 AM 08/04/2017    3:01 PM 08/21/2016    9:56 AM  Advanced Directives  Does Patient Have a Medical Advance Directive? Yes Yes Yes No No  Type of Paramedic of [place];Living will [place];Living will [place];Living will;Out of facility DNR (pink MOST or yellow form)    Copy of [place] in Chart? No - copy requested No - copy requested       Current Medications (verified) Outpatient Encounter Medications as of 12/09/2022  Medication Sig   allopurinol (ZYLOPRIM) 100 MG tablet Take 1 tablet (100 mg total) by mouth daily.   amLODipine (NORVASC) 10 MG tablet Take 1 tablet (10 mg total) by mouth daily.   aspirin 81 MG tablet Take 81 mg by mouth daily.   atorvastatin (LIPITOR) 40 MG tablet Take 1 tablet (40 mg total) by mouth daily.   cyanocobalamin (VITAMIN B12) 1000 MCG tablet Take 1 tablet (1,000 mcg total) by mouth daily.   dorzolamide-timolol (COSOPT) 22.3-6.8 MG/ML ophthalmic solution 1 drop 2 (two) times daily.   doxazosin (CARDURA) 2 MG tablet Take 1 tablet (2 mg total) by mouth daily.   loratadine (CLARITIN) 10  MG tablet Take 10 mg by mouth daily.   metoprolol tartrate (LOPRESSOR) 100 MG tablet Take 1 tablet (100 mg total) by mouth 2 (two) times daily.   doxycycline (VIBRA-TABS) 100 MG tablet Take 1 tablet (100 mg total) by mouth 2 (two) times daily. (Patient not taking: Reported on 12/05/2022)   sildenafil (REVATIO) 20 MG tablet Take 5 tablets (100 mg total) by mouth daily as needed. (Patient not taking: Reported on 12/09/2022)   No facility-administered encounter medications on file as of 12/09/2022.    Allergies (verified) Hctz [hydrochlorothiazide], Septra [sulfamethoxazole-trimethoprim], Spironolactone, and Valsartan   History: Past Medical History:  Diagnosis Date   CAD (coronary artery disease) 07/17/2004   Diverticulosis of colon 12/15/2005   Esophageal stricture    Gastritis    GERD (gastroesophageal reflux disease) 09/16/1996   Glaucoma    Heart disease    History of tobacco abuse    Quit 2000   Hx of cardiovascular stress test    ETT-Myoview (01/2014):  1.5 mm ST depression in inf leads at peak exercise; no ischemia or scar, EF 57%, Low Risk   Hyperlipidemia 03/17/1999   Hypertension 03/17/1999   Stress fracture of foot    right midfoot, per Dr. Canary Brim 2012   Past Surgical History:  Procedure Laterality Date   CATARACT EXTRACTION, BILATERAL     COLONOSCOPY  12/30/2005   Divertics, mild, 10 yrs   COLONOSCOPY  11/26/2006   Colitis  biopsy neg   CORONARY ARTERY BYPASS GRAFT  07/27/2004   By Dr. Roxy Manns with a LIMA to the LAD, RIMA to the distal right coronary artery, spahenous vein graft first diagonal, saphenous vein graft to the first circumflex marginal, sequential saphenous vein graft to the second   ESOPHAGOGASTRODUODENOSCOPY  10/08/2000   Stricture distal esoph, repeat dilation done 2012   ESOPHAGOGASTRODUODENOSCOPY  10/08/2000   Stricture distal esoph   I & D EXTREMITY Left 01/24/2017   Procedure: MINOR INCISION AND DRAINAGE LEFT THUMB;  Surgeon: Leanora Cover, MD;  Location: [facility];  Service: Orthopedics;  Laterality: Left;   Family History  Problem Relation Age of Onset   Peripheral vascular disease Mother    Stroke Father        x2   Hypertension Brother    Heart attack Brother        MI   Colon polyps Brother    Heart disease Brother        MI   Hypertension Sister    Breast cancer Sister    Hypertension Sister    Hypertension Sister    Colon polyps Other    Diabetes Neg Hx    Depression Neg Hx    Alcohol abuse Neg Hx    Drug abuse Neg Hx    Prostate cancer Neg Hx    Colon cancer Neg Hx    Social History   Socioeconomic History   Marital status: Married    Spouse name: Not on file   Number of children: 2   Years of education: Not on file   Highest education level: Not on file  Occupational History   Occupation: Sports administrator 3 + years    Employer: RETIRED    Comment: Retired  Tobacco Use   Smoking status: Former    Types: Cigarettes    Quit date: 09/16/1998    Years since quitting: 24.2   Smokeless tobacco: Never  Vaping Use   Vaping Use: Never used  Substance and Sexual Activity   Alcohol use: Yes    Comment: beer 5 a week   Drug use: No   Sexual activity: Not on file  Other Topics Concern   Not on file  Social History Narrative   Married in 1974   Lives with wife   Enjoys fishing   stretching, weights   [employer] fan      Are you right handed or left handed? Right Handed   Are you currently employed ? No    What is your current occupation?   Do you live at home alone? No   Who lives with you? Lives with wife    What type of home do you live in: 1 story or 2 story? Lives in a one story home    Caffeine none    Social Determinants of Health   Financial Resource Strain: Low Risk  (12/09/2022)   Overall Financial Resource Strain (CARDIA)    Difficulty of Paying Living Expenses: Not hard at all  Food Insecurity: No Food Insecurity (12/09/2022)   Hunger Vital Sign    Worried About Running Out  of Food in the Last Year: Never true    Ran Out of Food in the Last Year: Never true  Transportation Needs: No Transportation Needs (12/09/2022)   PRAPARE - Hydrologist (Medical): No    Lack of Transportation (Non-Medical): No  Physical Activity: Inactive (12/09/2022)   Exercise Vital Sign  Days of Exercise per Week: 0 days    Minutes of Exercise per Session: 0 min  Stress: No Stress Concern Present (12/09/2022)   [place]    Feeling of Stress : Not at all  Social Connections: Moderately Integrated (12/09/2022)   Social Connection and Isolation Panel [NHANES]    Frequency of Communication with Friends and Family: More than three times a week    Frequency of Social Gatherings with Friends and Family: More than three times a week    Attends Religious Services: More than 4 times per year    Active Member of [employer] or Organizations: No    Attends Music therapist: Never    Marital Status: Married    Tobacco Counseling Counseling given: Not Answered   Clinical Intake:  Pre-visit preparation completed: Yes  Pain : No/denies pain     Nutritional Risks: None Diabetes: No  How often do you need to have someone help you when you read instructions, pamphlets, or other written materials from your doctor or pharmacy?: 1 - Never  Diabetic? No   Interpreter Needed?: No  Information entered by :: C.Rhodie Cienfuegos LPN   Activities of Daily Living    12/09/2022    1:43 PM  In your present state of health, do you have any difficulty performing the following activities:  Hearing? 0  Vision? 0  Difficulty concentrating or making decisions? 0  Walking or climbing stairs? 0  Dressing or bathing? 0  Doing errands, shopping? 0  Preparing Food and eating ? N  Using the Toilet? N  In the past six months, have you accidently leaked urine? N  Do you have problems with loss of bowel  control? N  Managing your Medications? N  Managing your Finances? N  Housekeeping or managing your Housekeeping? N    Patient Care Team: Tonia Ghent, MD as PCP - General (Family Medicine) Minus Breeding, MD as PCP - Cardiology (Cardiology) Zadie Rhine Clent Demark, MD as Consulting Physician (Ophthalmology) Druscilla Brownie, MD as Consulting Physician (Dermatology) Hortencia Pilar, MD as Consulting Physician (Surgery) Regal, Tamala Fothergill, DPM as Consulting Physician (Podiatry) Minus Breeding, MD as Consulting Physician (Cardiology) Shellia Carwin, MD as Consulting Physician (Neurology)  Indicate any recent Medical Services you may have received from other than [facility] providers in the past year (date may be approximate).     Assessment:   This is a routine wellness examination for Alan [name].  Hearing/Vision screen Hearing Screening - Comments:: No aids Vision Screening - Comments:: Readers - Dr.Groat  Dietary issues and exercise activities discussed: Current Exercise Habits: Home exercise routine, Exercise limited by: None identified   Goals Addressed             This Visit's Progress    Patient Stated       No new Goal.       Depression Screen    12/09/2022    1:42 PM 12/04/2021   10:01 AM 09/07/2020   11:30 AM 09/03/2019    9:55 AM 09/01/2018    9:52 AM 08/29/2017   11:23 AM 08/21/2016    9:56 AM  PHQ 2/9 Scores  PHQ - 2 Score 0 0 0 0 0 0 0    Fall Risk    12/09/2022    1:35 PM 12/05/2022   10:19 AM 08/29/2022   10:45 AM 12/04/2021   10:01 AM 09/07/2020   11:30 AM  Fall Risk   Falls in the  past year? 0 0 0 0 0  Number falls in past yr: 0 0 0 0 0  Injury with Fall? 0 0 0 0 0  Risk for fall due to : No Fall Risks   No Fall Risks   Follow up Falls prevention discussed;Falls evaluation completed Falls evaluation completed Falls evaluation completed Falls evaluation completed Falls evaluation completed    FALL RISK PREVENTION PERTAINING TO THE HOME:  Any  stairs in or around the home? No  If so, are there any without handrails? Yes  Home free of loose throw rugs in walkways, pet beds, electrical cords, etc? Yes  Adequate lighting in your home to reduce risk of falls? Yes   ASSISTIVE DEVICES UTILIZED TO PREVENT FALLS:  Life alert? No  Use of a cane, walker or w/c? No  Grab bars in the bathroom? Yes  Shower chair or bench in shower? Yes  Elevated toilet seat or a handicapped toilet? Yes    Cognitive Function:    08/21/2016   10:01 AM  MMSE - Mini Mental State Exam  Orientation to time 5  Orientation to Place 5  Registration 3  Attention/ Calculation 0  Recall 3  Language- name 2 objects 0  Language- repeat 1  Language- follow 3 step command 3  Language- read & follow direction 0  Write a sentence 0  Copy design 0  Total score 20        12/09/2022    1:45 PM  6CIT Screen  What Year? 0 points  What month? 0 points  What time? 0 points  Count back from 20 0 points  Months in reverse 0 points  Repeat phrase 0 points  Total Score 0 points    Immunizations Immunization History  Administered Date(s) Administered   Fluad Quad(high Dose 65+) 06/08/2019, 07/15/2020, 06/25/2022   Influenza Whole 06/16/2006, 06/12/2009, 06/15/2010   Influenza, High Dose Seasonal PF 06/20/2021   Influenza,inj,Quad PF,6+ Mos 06/11/2013, 06/15/2014, 05/31/2015, 05/24/2016, 06/05/2017, 06/25/2018   Pneumococcal Conjugate-13 08/18/2014   Pneumococcal Polysaccharide-23 07/23/2011   Td 03/21/1999, 07/13/2009   Tdap 06/25/2018   Zoster, Live 09/17/2011    TDAP status: Up to date  Flu Vaccine status: Up to date  Pneumococcal vaccine status: Up to date  Covid-19 vaccine status: Information provided on how to obtain vaccines.   Qualifies for Shingles Vaccine? Yes   Zostavax completed  unknown   Shingrix Completed?: No.    Education has been provided regarding the importance of this vaccine. Patient has been advised to call insurance company  to determine out of pocket expense if they have not yet received this vaccine. Advised may also receive vaccine at local pharmacy or Health Dept. Verbalized acceptance and understanding.  Screening Tests Health Maintenance  Topic Date Due   COVID-19 Vaccine (1) Never done   Zoster Vaccines- Shingrix (1 of 2) Never done   COLONOSCOPY (Pts 45-6yrs Insurance coverage will need to be confirmed)  08/04/2022   Medicare Annual Wellness (AWV)  12/09/2023   DTaP/Tdap/Td (4 - Td or Tdap) 06/25/2028   Pneumonia Vaccine 67+ Years old  Completed   INFLUENZA VACCINE  Completed   Hepatitis C Screening  Completed   HPV VACCINES  Aged Out    Health Maintenance  Health Maintenance Due  Topic Date Due   COVID-19 Vaccine (1) Never done   Zoster Vaccines- Shingrix (1 of 2) Never done   COLONOSCOPY (Pts 45-4yrs Insurance coverage will need to be confirmed)  08/04/2022    Colorectal  cancer screening: No longer required.  Will discuss with PCP.  Lung Cancer Screening: (Low Dose CT Chest recommended if Age 41-80 years, 30 pack-year currently smoking OR have quit w/in 15years.) does not qualify.   Lung Cancer Screening Referral: no  Additional Screening:  Hepatitis C Screening: does not qualify; Completed 08/21/15  Vision Screening: Recommended annual ophthalmology exams for early detection of glaucoma and other disorders of the eye. Is the patient up to date with their annual eye exam?  Yes  Who is the provider or what is the name of the office in which the patient attends annual eye exams? Dr.Groat If pt is not established with a provider, would they like to be referred to a provider to establish care? No .   Dental Screening: Recommended annual dental exams for proper oral hygiene  Community Resource Referral / Chronic Care Management: CRR required this visit?  No   CCM required this visit?  No      Plan:     I have personally reviewed and noted the following in the patient's chart:    Medical and social history Use of alcohol, tobacco or illicit drugs  Current medications and supplements including opioid prescriptions. Patient is not currently taking opioid prescriptions. Functional ability and status Nutritional status Physical activity Advanced directives List of other physicians Hospitalizations, surgeries, and ER visits in previous 12 months Vitals Screenings to include cognitive, depression, and falls Referrals and appointments  In addition, I have reviewed and discussed with patient certain preventive protocols, quality metrics, and best practice recommendations. A written personalized care plan for preventive services as well as general preventive health recommendations were provided to patient.     Lebron Conners, LPN   [id number]   Nurse Notes: none

## 2022-12-10 ENCOUNTER — Encounter: Payer: Self-pay | Admitting: Family Medicine

## 2022-12-10 ENCOUNTER — Ambulatory Visit (INDEPENDENT_AMBULATORY_CARE_PROVIDER_SITE_OTHER): Payer: Medicare HMO | Admitting: Family Medicine

## 2022-12-10 VITALS — BP 118/64 | HR 56 | Temp 98.0°F | Ht 69.0 in | Wt 194.0 lb

## 2022-12-10 DIAGNOSIS — I10 Essential (primary) hypertension: Secondary | ICD-10-CM

## 2022-12-10 DIAGNOSIS — E785 Hyperlipidemia, unspecified: Secondary | ICD-10-CM | POA: Diagnosis not present

## 2022-12-10 DIAGNOSIS — Z7189 Other specified counseling: Secondary | ICD-10-CM

## 2022-12-10 DIAGNOSIS — K5900 Constipation, unspecified: Secondary | ICD-10-CM

## 2022-12-10 DIAGNOSIS — R269 Unspecified abnormalities of gait and mobility: Secondary | ICD-10-CM

## 2022-12-10 DIAGNOSIS — Z0000 Encounter for general adult medical examination without abnormal findings: Secondary | ICD-10-CM

## 2022-12-10 DIAGNOSIS — M109 Gout, unspecified: Secondary | ICD-10-CM | POA: Diagnosis not present

## 2022-12-10 MED ORDER — POLYETHYLENE GLYCOL 3350 17 GM/SCOOP PO POWD: 17.0000 g | Freq: Every day | ORAL | 1 refills | Status: DC | PRN

## 2022-12-10 NOTE — Progress Notes (Unsigned)
Hypertension:    Using medication without problems or lightheadedness: yes Chest pain with exertion:no Edema:no Short of breath:no Labs d/w pt.    Some constipation, 3-4 days between BMs.  D/w pt about inc in fiber.  No blood in stools. No black stools.    Gout.  No flares. Still on allopurinol.  D/w pt about diet.    Elevated Cholesterol: Using medications without problems: yes Muscle aches: no Diet compliance: d/w pt.  Exercise: d/w pt.    D/w pt about neuro eval/plan for gait  He is still using weight for leg exercises.   -Continue B1 supplementation 100 mg daily -Cut back and stop EtOH as able -Fall precautions discussed He cut back on etoh gradually.  Discussed gradual taper.    Prev back pain resolved.  Flu 2023 Shingles discussed with patient.  Zostavax previously done. PNA up-to-date Tetanus 2019 COVID-vaccine previously done.   RSV d/w pt.  Colonoscopy 2018 Prostate cancer screening-deferred 2024.  Discussed with patient.  He agreed. Advance directive-updated 2023.  Son and daughter are equally designated to patient when capacitated.  Meds, vitals, and allergies reviewed.   PMH and SH reviewed  ROS: Per HPI unless specifically indicated in ROS section   GEN: nad, alert and oriented HEENT: mucous membranes moist NECK: supple w/o LA CV: rrr. PULM: ctab, no inc wob ABD: soft, +bs EXT: no edema SKIN: no acute rash Decreased stride length noted with gait, but symmetric.

## 2022-12-10 NOTE — Patient Instructions (Addendum)
Check with your insurance to see if they will cover the shingles shot. Add on extra fruits and veggies and if needed use miralax.  Take care.  Glad to see you. Don't change your meds for now.

## 2022-12-11 DIAGNOSIS — K5900 Constipation, unspecified: Secondary | ICD-10-CM | POA: Insufficient documentation

## 2022-12-11 NOTE — Assessment & Plan Note (Signed)
Some constipation, 3-4 days between BMs.  D/w pt about inc in fiber.  No blood in stools. No black stools.  He can add on MiraLAX if needed.  See after visit summary.

## 2022-12-11 NOTE — Assessment & Plan Note (Signed)
Continue atorvastatin.  Continue work on diet and exercise. 

## 2022-12-11 NOTE — Assessment & Plan Note (Signed)
Advance directive-updated 2023.  Son and daughter are equally designated to patient when capacitated.

## 2022-12-11 NOTE — Assessment & Plan Note (Signed)
No flares. Still on allopurinol.  D/w pt about diet.  Continue allopurinol as is.

## 2022-12-11 NOTE — Assessment & Plan Note (Signed)
Flu 2023 Shingles discussed with patient.  Zostavax previously done. PNA up-to-date Tetanus 2019 COVID-vaccine previously done.   RSV d/w pt.  Colonoscopy 2018 Prostate cancer screening-deferred 2024.  Discussed with patient.  He agreed. Advance directive-updated 2023.  Son and daughter are equally designated to patient when capacitated.

## 2022-12-11 NOTE — Assessment & Plan Note (Signed)
Discussed neurology findings, B1 supplementation, tapering alcohol.

## 2022-12-11 NOTE — Assessment & Plan Note (Signed)
Continue amlodipine doxazosin metoprolol.  Continue work on diet and exercise.

## 2022-12-30 DIAGNOSIS — H3561 Retinal hemorrhage, right eye: Secondary | ICD-10-CM | POA: Diagnosis not present

## 2022-12-30 DIAGNOSIS — H353211 Exudative age-related macular degeneration, right eye, with active choroidal neovascularization: Secondary | ICD-10-CM | POA: Diagnosis not present

## 2022-12-30 DIAGNOSIS — H401132 Primary open-angle glaucoma, bilateral, moderate stage: Secondary | ICD-10-CM | POA: Diagnosis not present

## 2022-12-30 DIAGNOSIS — H353132 Nonexudative age-related macular degeneration, bilateral, intermediate dry stage: Secondary | ICD-10-CM | POA: Diagnosis not present

## 2022-12-30 DIAGNOSIS — H353223 Exudative age-related macular degeneration, left eye, with inactive scar: Secondary | ICD-10-CM | POA: Diagnosis not present

## 2023-01-02 ENCOUNTER — Ambulatory Visit: Payer: Medicare HMO | Admitting: Cardiology

## 2023-01-08 NOTE — Progress Notes (Unsigned)
  Cardiology Office Note:   Date:  01/09/2023  ID:  Alan [name], DOB [birth year], MRN [id number]  History of Present Illness:   Alan [name] is a 77 y.o. male who presents for follow of CAD status post CABG.  Since I last saw him he has done well from a cardiovascular standpoint.  Still taking care of his wife who really needs a lot of care ever since she had a femur fracture and nerve injury a few years ago.  He did a little bit of gym work in February or so but has not been exercising as much at home as I would like despite the fact that he has all of the equipment including a bike and a treadmill.  When he has been exercising he denies any cardiovascular symptoms.  The patient denies any new symptoms such as chest discomfort, neck or arm discomfort. There has been no new shortness of breath, PND or orthopnea. There have been no reported palpitations, presyncope or syncope.   ROS: As stated in the HPI and negative for all other systems.  Studies Reviewed:    EKG: Sinus rhythm, rate 60, incomplete right bundle branch block, no acute ST-T wave changes.  Risk Assessment/Calculations:      Physical Exam:   VS:  BP (!) 142/70   Pulse 69   Ht  (1.753 m)   Wt 194 lb 6.4 oz (88.2 kg)   SpO2 97%   BMI 28.71 kg/m    Wt Readings from Last 3 Encounters:  01/09/23 194 lb 6.4 oz (88.2 kg)  12/10/22 194 lb (88 kg)  12/09/22 192 lb (87.1 kg)     GEN: Well nourished, well developed in no acute distress NECK: No JVD; No carotid bruits CARDIAC: RRR, no murmurs, rubs, gallops RESPIRATORY:  Clear to auscultation without rales, wheezing or rhonchi  ABDOMEN: Soft, non-tender, non-distended EXTREMITIES:  No edema; No deformity   ASSESSMENT AND PLAN:   CAD:   The patient has no new sypmtoms.  No further cardiovascular testing is indicated.  We will continue with aggressive risk reduction and meds as listed.   HTN:    The blood pressure is mildly elevated but he is going to keep a blood  pressure diary.  He did not take his medicines yet this morning.  A  CAROTID STENOSIS:    This was moderate in Dec 2023.  He will have follow-up in December.   CHOLESTEROL:   LDL was 57 with an HDL of 64.  No change in therapy.  55 with an HDL of 66.  No change in therapy.       Signed, Rollene Rotunda, MD

## 2023-01-09 ENCOUNTER — Ambulatory Visit: Payer: Medicare HMO | Attending: Cardiology | Admitting: Cardiology

## 2023-01-09 ENCOUNTER — Encounter: Payer: Self-pay | Admitting: Cardiology

## 2023-01-09 VITALS — BP 142/70 | HR 69 | Ht 69.0 in | Wt 194.4 lb

## 2023-01-09 DIAGNOSIS — I2510 Atherosclerotic heart disease of native coronary artery without angina pectoris: Secondary | ICD-10-CM

## 2023-01-09 DIAGNOSIS — E785 Hyperlipidemia, unspecified: Secondary | ICD-10-CM

## 2023-01-09 DIAGNOSIS — I6529 Occlusion and stenosis of unspecified carotid artery: Secondary | ICD-10-CM

## 2023-01-09 DIAGNOSIS — I10 Essential (primary) hypertension: Secondary | ICD-10-CM

## 2023-01-09 NOTE — Patient Instructions (Signed)
Medication Instructions:  Your physician recommends that you continue on your current medications as directed. Please refer to the Current Medication list given to you today.  *If you need a refill on your cardiac medications before your next appointment, please call your pharmacy*   Lab Work: None   Testing/Procedures: None   Follow-Up: At [facility], you and your health needs are our priority.  As part of our continuing mission to provide you with exceptional heart care, we have created designated Provider Care Teams.  These Care Teams include your primary Cardiologist (physician) and Advanced Practice Providers (APPs -  Physician Assistants and Nurse Practitioners) who all work together to provide you with the care you need, when you need it.  Your next appointment:   1 year(s)  Provider:   James Hochrein, MD    

## 2023-01-10 NOTE — Addendum Note (Signed)
Addended by: Amaani Guilbault, [place] M on: 01/10/2023 10:10 AM   Modules accepted: Orders

## 2023-01-22 DIAGNOSIS — L218 Other seborrheic dermatitis: Secondary | ICD-10-CM | POA: Diagnosis not present

## 2023-01-22 DIAGNOSIS — Z85828 Personal history of other malignant neoplasm of skin: Secondary | ICD-10-CM | POA: Diagnosis not present

## 2023-01-22 DIAGNOSIS — L570 Actinic keratosis: Secondary | ICD-10-CM | POA: Diagnosis not present

## 2023-01-22 DIAGNOSIS — I872 Venous insufficiency (chronic) (peripheral): Secondary | ICD-10-CM | POA: Diagnosis not present

## 2023-01-22 DIAGNOSIS — Z08 Encounter for follow-up examination after completed treatment for malignant neoplasm: Secondary | ICD-10-CM | POA: Diagnosis not present

## 2023-02-03 DIAGNOSIS — H353132 Nonexudative age-related macular degeneration, bilateral, intermediate dry stage: Secondary | ICD-10-CM | POA: Diagnosis not present

## 2023-02-03 DIAGNOSIS — H3561 Retinal hemorrhage, right eye: Secondary | ICD-10-CM | POA: Diagnosis not present

## 2023-02-03 DIAGNOSIS — H353211 Exudative age-related macular degeneration, right eye, with active choroidal neovascularization: Secondary | ICD-10-CM | POA: Diagnosis not present

## 2023-02-03 DIAGNOSIS — H401132 Primary open-angle glaucoma, bilateral, moderate stage: Secondary | ICD-10-CM | POA: Diagnosis not present

## 2023-02-03 DIAGNOSIS — H353223 Exudative age-related macular degeneration, left eye, with inactive scar: Secondary | ICD-10-CM | POA: Diagnosis not present

## 2023-03-17 DIAGNOSIS — H353223 Exudative age-related macular degeneration, left eye, with inactive scar: Secondary | ICD-10-CM | POA: Diagnosis not present

## 2023-03-17 DIAGNOSIS — H353211 Exudative age-related macular degeneration, right eye, with active choroidal neovascularization: Secondary | ICD-10-CM | POA: Diagnosis not present

## 2023-03-17 DIAGNOSIS — H353132 Nonexudative age-related macular degeneration, bilateral, intermediate dry stage: Secondary | ICD-10-CM | POA: Diagnosis not present

## 2023-03-17 DIAGNOSIS — H3561 Retinal hemorrhage, right eye: Secondary | ICD-10-CM | POA: Diagnosis not present

## 2023-03-17 DIAGNOSIS — H401132 Primary open-angle glaucoma, bilateral, moderate stage: Secondary | ICD-10-CM | POA: Diagnosis not present

## 2023-03-17 IMAGING — MR MR LUMBAR SPINE W/O CM
4 of 5 series · 18 of 48 positions shown · non-contrast
Comparison: None.

CLINICAL DATA: Lumbar radiculopathy, > 6 wks. Bilateral leg pain
since May 2021 No surgery or injections,

EXAM:
MRI LUMBAR SPINE WITHOUT CONTRAST
TECHNIQUE: Multiplanar, multisequence MR imaging of the lumbar spine was
performed. No intravenous contrast was administered.

[Series 5: T2 · sagittal · 4.0mm · 0.73mm/px · 6 of 17 slices shown (1 of 2)]
[im 1/17]
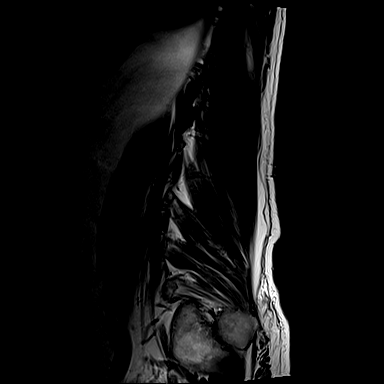
[im 4/17]
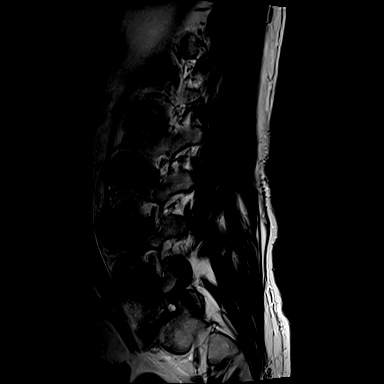
[im 7/17]
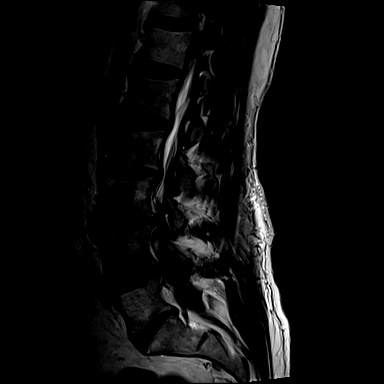
[im 10/17]
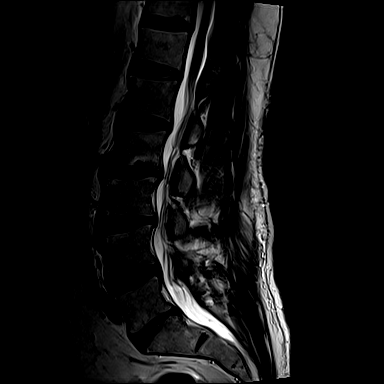
[im 13/17]
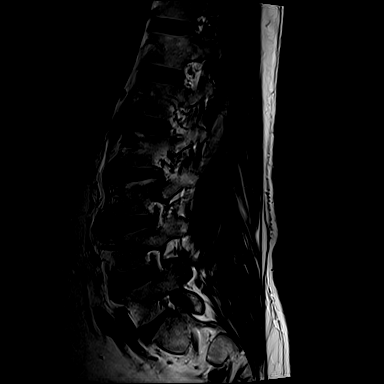
[im 17/17]
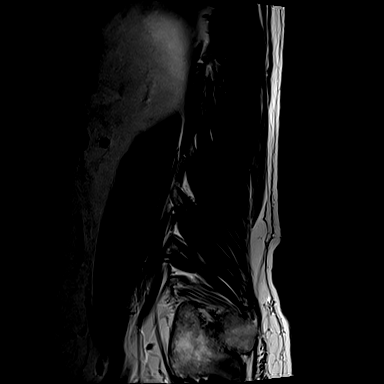

[Series 6: T1 · sagittal · 4.0mm · 0.73mm/px · 3 of 17 slices shown (1 of 2)]
[im 4/17]
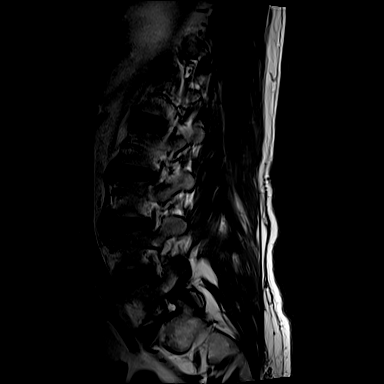
[im 10/17]
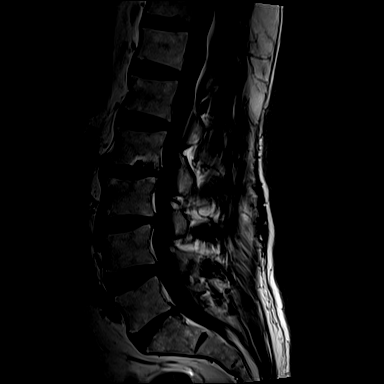
[im 17/17]
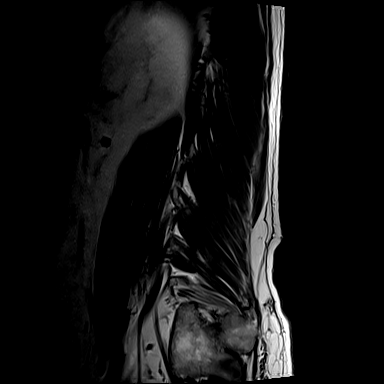

[Series 10: T1 · axial · 4.0mm · 0.28mm/px · z∈[-60,+120]mm · 3 of 46 slices shown (2 of 2)]
[im 7/46]
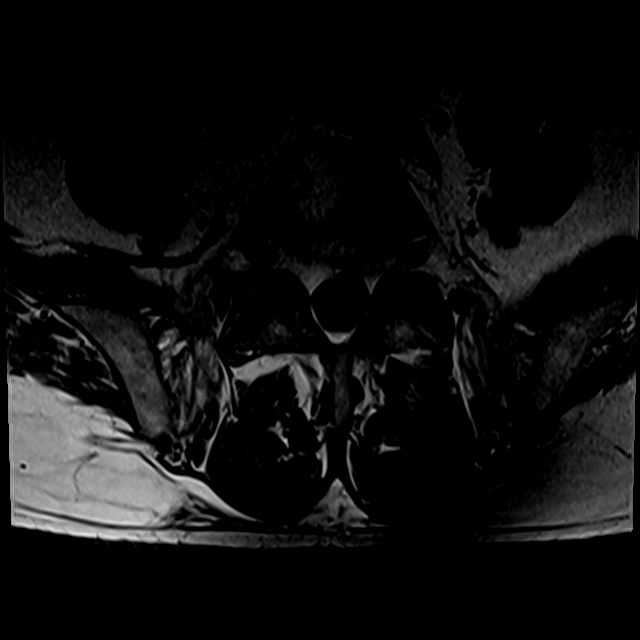
[im 23/46]
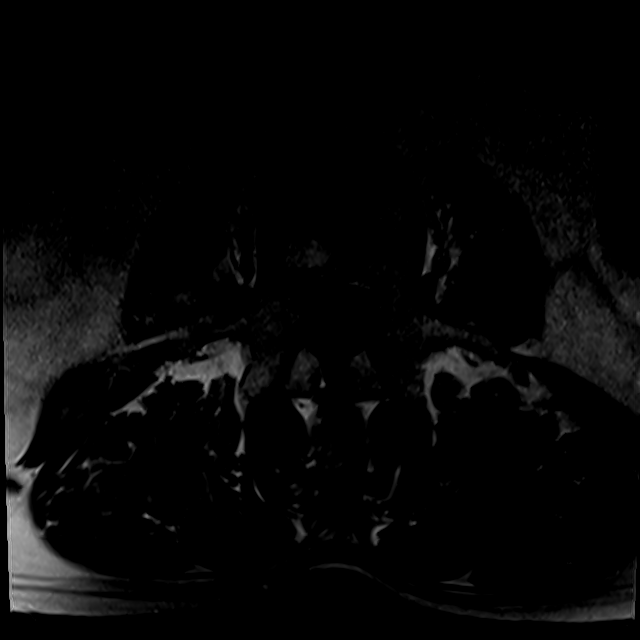
[im 39/46]
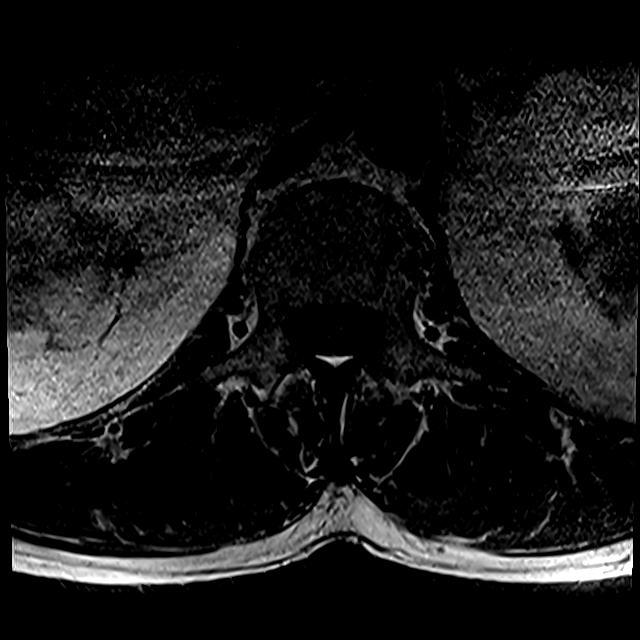

[Series 13: T2 · axial · 4.0mm · 0.28mm/px · z∈[-89,+120]mm · 6 of 46 slices shown (2 of 2)]
[im 1/46]
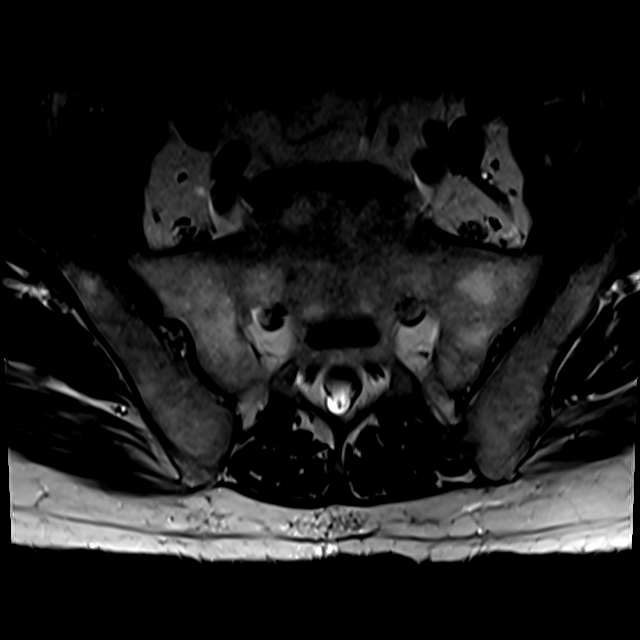
[im 7/46]
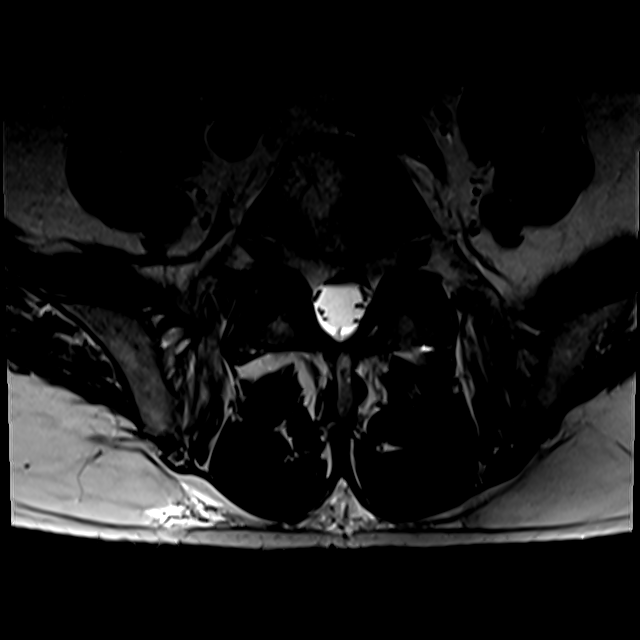
[im 13/46]
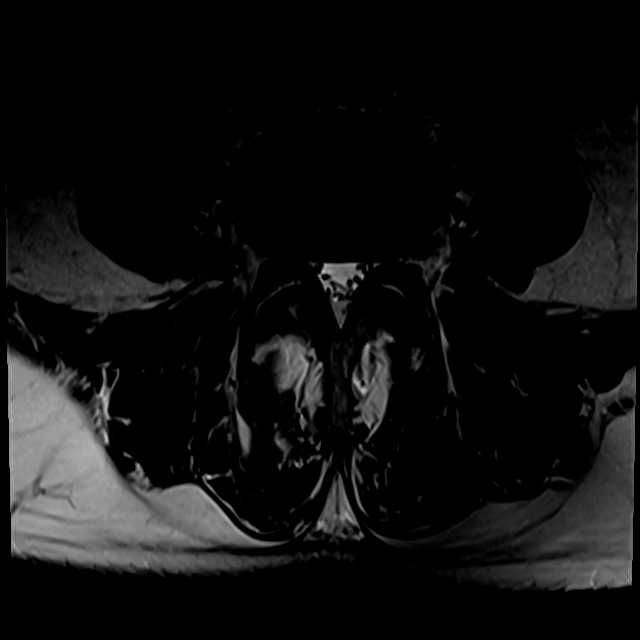
[im 20/46]
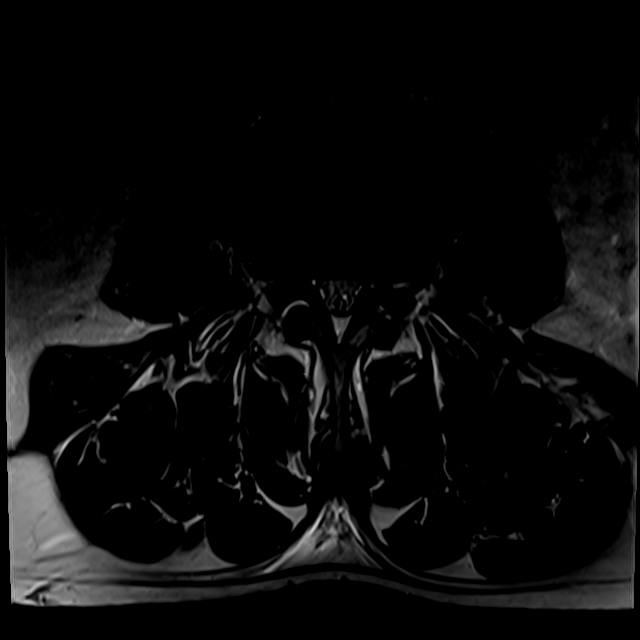
[im 23/46]
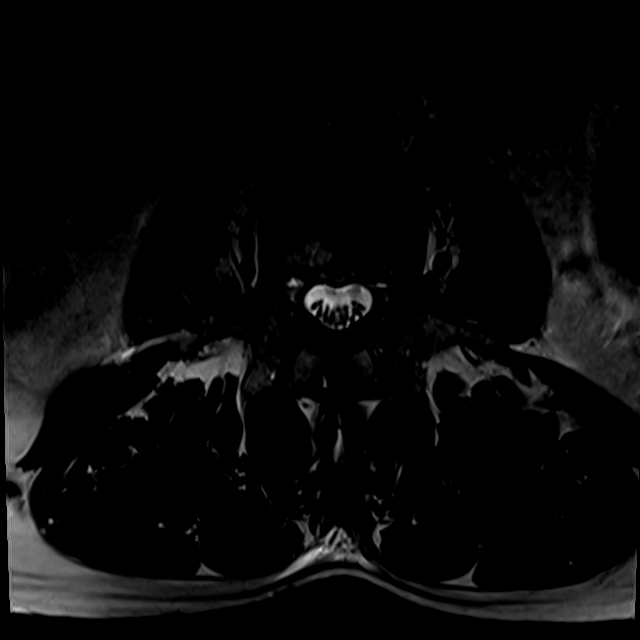
[im 39/46]
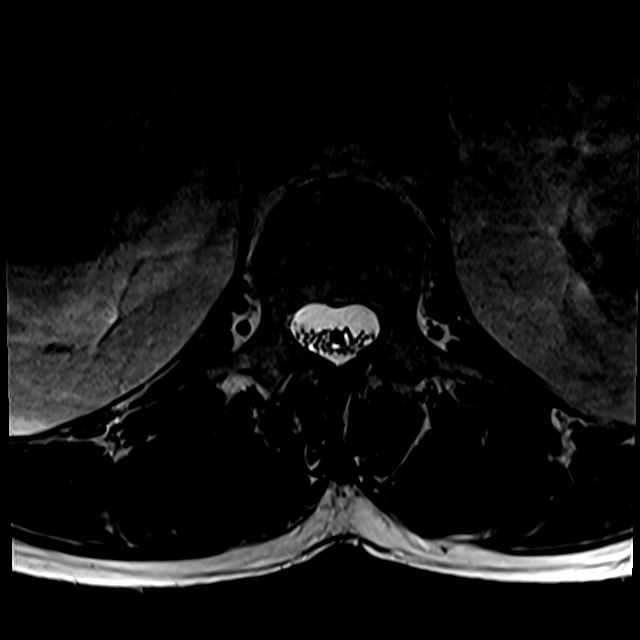

[18 of 48 positions shown; findings below may reference images not displayed]

FINDINGS: Segmentation:  Standard

Alignment:  Grade 1 retrolisthesis at L2-3 and L3-4

Vertebrae:  No fracture, evidence of discitis, or bone lesion.

Conus medullaris and cauda equina: Conus extends to the L1 level.
Conus and cauda equina appear normal.

Paraspinal and other soft tissues: Negative.

Disc levels:

L1-L2: Normal disc space and facet joints. No spinal canal stenosis.
No neural foraminal stenosis.

L2-L3: Mild disc bulging. Narrowing of both lateral recesses without
central spinal canal stenosis. No neural foraminal stenosis.

L3-L4: Intermediate sized disc bulge. Mild spinal canal stenosis. No
neural foraminal stenosis.

L4-L5: Normal disc space and facet joints. No spinal canal stenosis.
No neural foraminal stenosis.

L5-S1: Small disc bulge. Mild right facet hypertrophy. No spinal
canal stenosis. No neural foraminal stenosis.

Visualized sacrum: Normal.
IMPRESSION: 1. Mild lumbar degenerative disc disease with mild spinal canal
stenosis at L3-4.
2. Bilateral lateral recess narrowing at L2-3 secondary to
combination of disc bulging and facet arthrosis. Correlate for L3
radiculopathy.

## 2023-04-17 ENCOUNTER — Encounter: Payer: Self-pay | Admitting: Family Medicine

## 2023-04-17 DIAGNOSIS — D4110 Neoplasm of uncertain behavior of unspecified renal pelvis: Secondary | ICD-10-CM

## 2023-05-06 DIAGNOSIS — Z961 Presence of intraocular lens: Secondary | ICD-10-CM | POA: Diagnosis not present

## 2023-05-06 DIAGNOSIS — H40013 Open angle with borderline findings, low risk, bilateral: Secondary | ICD-10-CM | POA: Diagnosis not present

## 2023-05-06 DIAGNOSIS — H353132 Nonexudative age-related macular degeneration, bilateral, intermediate dry stage: Secondary | ICD-10-CM | POA: Diagnosis not present

## 2023-05-12 DIAGNOSIS — H353132 Nonexudative age-related macular degeneration, bilateral, intermediate dry stage: Secondary | ICD-10-CM | POA: Diagnosis not present

## 2023-05-12 DIAGNOSIS — H353223 Exudative age-related macular degeneration, left eye, with inactive scar: Secondary | ICD-10-CM | POA: Diagnosis not present

## 2023-05-12 DIAGNOSIS — H401132 Primary open-angle glaucoma, bilateral, moderate stage: Secondary | ICD-10-CM | POA: Diagnosis not present

## 2023-05-12 DIAGNOSIS — H3561 Retinal hemorrhage, right eye: Secondary | ICD-10-CM | POA: Diagnosis not present

## 2023-05-12 DIAGNOSIS — H353211 Exudative age-related macular degeneration, right eye, with active choroidal neovascularization: Secondary | ICD-10-CM | POA: Diagnosis not present

## 2023-06-04 ENCOUNTER — Telehealth (INDEPENDENT_AMBULATORY_CARE_PROVIDER_SITE_OTHER): Payer: Medicare HMO | Admitting: Internal Medicine

## 2023-06-04 ENCOUNTER — Encounter: Payer: Self-pay | Admitting: Internal Medicine

## 2023-06-04 DIAGNOSIS — U071 COVID-19: Secondary | ICD-10-CM | POA: Insufficient documentation

## 2023-06-04 MED ORDER — HYDROCODONE BIT-HOMATROP MBR 5-1.5 MG/5ML PO SOLN: 5.0000 mL | Freq: Every evening | ORAL | 0 refills | Status: DC | PRN

## 2023-06-04 NOTE — Progress Notes (Signed)
Subjective:    Patient ID: Alan [name], male    DOB: 1946-02-15, 77 y.o.   MRN: [id number]  HPI Video virtual visit due to COVID infection Identification done Reviewed limitations and billing and he gave consent Participants--patient in his home (wife helps with the history) and I am in my office  Started 3 days ago--didn't sleep at all 9/15 night Sore throat, sweats, felt hot--had fever Balance off, felt bad Micah Flesher out and got mucinex and lozenges Slept 3-4 hours that day and has felt better Has cough but not as bad--still keeping him up No SOB No headache  Also taking loratadine and tylenol Tested positive for COVID yesterday evening  Current Outpatient Medications on File Prior to Visit  Medication Sig Dispense Refill   allopurinol (ZYLOPRIM) 100 MG tablet Take 1 tablet (100 mg total) by mouth daily. 90 tablet 3   amLODipine (NORVASC) 10 MG tablet Take 1 tablet (10 mg total) by mouth daily. 90 tablet 3   aspirin 81 MG tablet Take 81 mg by mouth daily.     atorvastatin (LIPITOR) 40 MG tablet Take 1 tablet (40 mg total) by mouth daily. 90 tablet 3   cyanocobalamin (VITAMIN B12) 1000 MCG tablet Take 1 tablet (1,000 mcg total) by mouth daily.     dorzolamide-timolol (COSOPT) 22.3-6.8 MG/ML ophthalmic solution 1 drop 2 (two) times daily.     doxazosin (CARDURA) 2 MG tablet Take 1 tablet (2 mg total) by mouth daily. 90 tablet 3   loratadine (CLARITIN) 10 MG tablet Take 10 mg by mouth daily.     metoprolol tartrate (LOPRESSOR) 100 MG tablet Take 1 tablet (100 mg total) by mouth 2 (two) times daily. 180 tablet 3   sildenafil (REVATIO) 20 MG tablet Take 5 tablets (100 mg total) by mouth daily as needed. 50 tablet 3   No current facility-administered medications on file prior to visit.    Allergies  Allergen Reactions   Hctz [Hydrochlorothiazide] Other (See Comments)    Would avoid due to h/o gout.     Septra [Sulfamethoxazole-Trimethoprim]     diarrhea   Spironolactone Other  (See Comments)    Possible cause of aches.     Valsartan Other (See Comments)    Possible cause of aches.      Past Medical History:  Diagnosis Date   CAD (coronary artery disease) 07/17/2004   Diverticulosis of colon 12/15/2005   Esophageal stricture    Gastritis    GERD (gastroesophageal reflux disease) 09/16/1996   Glaucoma    Heart disease    History of tobacco abuse    Quit 2000   Hx of cardiovascular stress test    ETT-Myoview (01/2014):  1.5 mm ST depression in inf leads at peak exercise; no ischemia or scar, EF 57%, Low Risk   Hyperlipidemia 03/17/1999   Hypertension 03/17/1999   Stress fracture of foot    right midfoot, per Dr. Thereasa Parkin 2012    Past Surgical History:  Procedure Laterality Date   CATARACT EXTRACTION, BILATERAL     COLONOSCOPY  12/30/2005   Divertics, mild, 10 yrs   COLONOSCOPY  11/26/2006   Colitis biopsy neg   CORONARY ARTERY BYPASS GRAFT  07/27/2004   By Dr. Cornelius Moras with a LIMA to the LAD, RIMA to the distal right coronary artery, spahenous vein graft first diagonal, saphenous vein graft to the first circumflex marginal, sequential saphenous vein graft to the second   ESOPHAGOGASTRODUODENOSCOPY  10/08/2000   Stricture distal esoph, repeat dilation done  2012   ESOPHAGOGASTRODUODENOSCOPY  10/08/2000   Stricture distal esoph   I & D EXTREMITY Left 01/24/2017   Procedure: MINOR INCISION AND DRAINAGE LEFT THUMB;  Surgeon: Betha Loa, MD;  Location: [facility];  Service: Orthopedics;  Laterality: Left;    Family History  Problem Relation Age of Onset   Peripheral vascular disease Mother    Stroke Father        x2   Hypertension Brother    Heart attack Brother        MI   Colon polyps Brother    Heart disease Brother        MI   Hypertension Sister    Breast cancer Sister    Hypertension Sister    Hypertension Sister    Colon polyps Other    Diabetes Neg Hx    Depression Neg Hx    Alcohol abuse Neg Hx    Drug abuse Neg Hx    Prostate  cancer Neg Hx    Colon cancer Neg Hx     Social History   Socioeconomic History   Marital status: Married    Spouse name: Not on file   Number of children: 2   Years of education: Not on file   Highest education level: Not on file  Occupational History   Occupation: Scientist, 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 Right Handed   Are you currently employed ? No    What is your current occupation?   Do you live at home alone? No   Who lives with you? Lives with wife    What type of home do you live in: 1 story or 2 story? Lives in a one story home    Caffeine none    Social Determinants of Health   Financial Resource Strain: Low Risk  (12/09/2022)   Overall Financial Resource Strain (CARDIA)    Difficulty of Paying Living Expenses: Not hard at all  Food Insecurity: No Food Insecurity (12/09/2022)   Hunger Vital Sign    Worried About Running Out of Food in the Last Year: Never true    Ran Out of Food in the Last Year: Never true  Transportation Needs: No Transportation Needs (12/09/2022)   PRAPARE - Administrator, Civil Service (Medical): No    Lack of Transportation (Non-Medical): No  Physical Activity: Inactive (12/09/2022)   Exercise Vital Sign    Days of Exercise per Week: 0 days    Minutes of Exercise per Session: 0 min  Stress: No Stress Concern Present (12/09/2022)   [employer] of Occupational Health - Occupational  Stress Questionnaire    Feeling of Stress : Not at all  Social Connections: Moderately Integrated (12/09/2022)   Social Connection and Isolation Panel [  NHANES]    Frequency of Communication with Friends and Family: More than three times a week    Frequency of Social Gatherings with Friends and Family: More than three times a week    Attends Religious Services: More than 4 times per year    Active Member of [employer] or Organizations: No    Attends Banker Meetings: Never    Marital Status: Married  Catering manager Violence: Not At Risk (12/09/2022)   Humiliation, Afraid, Rape, and Kick questionnaire    Fear of Current or Ex-Partner: No    Emotionally Abused: No    Physically Abused: No    Sexually Abused: No   Review of Systems No change in smell or taste Appetite poor 2 days ago---now better today No N/V    Objective:   Physical Exam Constitutional:      Appearance: Normal appearance.  Pulmonary:     Effort: Pulmonary effort is normal. No respiratory distress.  Neurological:     Mental Status: He is alert.            Assessment & Plan:

## 2023-06-04 NOTE — Assessment & Plan Note (Signed)
Had bad day 2 days ago--but feeling better now Recommend tylenol regularly Hycodan cough syrup for night If worsens, to ER (if SOB) Discussed antiviral--will hold off Isolate this week--then out with a mask when feels better

## 2023-07-10 ENCOUNTER — Ambulatory Visit (INDEPENDENT_AMBULATORY_CARE_PROVIDER_SITE_OTHER): Payer: Medicare HMO

## 2023-07-10 DIAGNOSIS — Z23 Encounter for immunization: Secondary | ICD-10-CM

## 2023-07-14 DIAGNOSIS — H401132 Primary open-angle glaucoma, bilateral, moderate stage: Secondary | ICD-10-CM | POA: Diagnosis not present

## 2023-07-14 DIAGNOSIS — H353132 Nonexudative age-related macular degeneration, bilateral, intermediate dry stage: Secondary | ICD-10-CM | POA: Diagnosis not present

## 2023-07-14 DIAGNOSIS — H3561 Retinal hemorrhage, right eye: Secondary | ICD-10-CM | POA: Diagnosis not present

## 2023-07-14 DIAGNOSIS — H353211 Exudative age-related macular degeneration, right eye, with active choroidal neovascularization: Secondary | ICD-10-CM | POA: Diagnosis not present

## 2023-07-14 DIAGNOSIS — H353223 Exudative age-related macular degeneration, left eye, with inactive scar: Secondary | ICD-10-CM | POA: Diagnosis not present

## 2023-07-25 DIAGNOSIS — L570 Actinic keratosis: Secondary | ICD-10-CM | POA: Diagnosis not present

## 2023-07-25 DIAGNOSIS — L821 Other seborrheic keratosis: Secondary | ICD-10-CM | POA: Diagnosis not present

## 2023-07-25 DIAGNOSIS — Z85828 Personal history of other malignant neoplasm of skin: Secondary | ICD-10-CM | POA: Diagnosis not present

## 2023-07-25 DIAGNOSIS — D225 Melanocytic nevi of trunk: Secondary | ICD-10-CM | POA: Diagnosis not present

## 2023-07-25 DIAGNOSIS — Z08 Encounter for follow-up examination after completed treatment for malignant neoplasm: Secondary | ICD-10-CM | POA: Diagnosis not present

## 2023-07-25 DIAGNOSIS — L814 Other melanin hyperpigmentation: Secondary | ICD-10-CM | POA: Diagnosis not present

## 2023-07-28 ENCOUNTER — Encounter: Payer: Self-pay | Admitting: Cardiology

## 2023-07-28 NOTE — Telephone Encounter (Signed)
Error

## 2023-08-19 ENCOUNTER — Ambulatory Visit (HOSPITAL_COMMUNITY)
Admission: RE | Admit: 2023-08-19 | Discharge: 2023-08-19 | Disposition: A | Discharge status: Home / Self Care | Payer: Medicare HMO | Source: Ambulatory Visit | Attending: Cardiology | Admitting: Cardiology

## 2023-08-19 DIAGNOSIS — I6523 Occlusion and stenosis of bilateral carotid arteries: Secondary | ICD-10-CM | POA: Diagnosis not present

## 2023-08-21 ENCOUNTER — Other Ambulatory Visit (HOSPITAL_COMMUNITY): Payer: Self-pay

## 2023-08-21 DIAGNOSIS — I6523 Occlusion and stenosis of bilateral carotid arteries: Secondary | ICD-10-CM

## 2023-09-01 ENCOUNTER — Encounter: Payer: Self-pay | Admitting: Family Medicine

## 2023-09-01 DIAGNOSIS — H353132 Nonexudative age-related macular degeneration, bilateral, intermediate dry stage: Secondary | ICD-10-CM | POA: Diagnosis not present

## 2023-09-01 DIAGNOSIS — H353223 Exudative age-related macular degeneration, left eye, with inactive scar: Secondary | ICD-10-CM | POA: Diagnosis not present

## 2023-09-01 DIAGNOSIS — H353211 Exudative age-related macular degeneration, right eye, with active choroidal neovascularization: Secondary | ICD-10-CM | POA: Diagnosis not present

## 2023-09-01 DIAGNOSIS — H3561 Retinal hemorrhage, right eye: Secondary | ICD-10-CM | POA: Diagnosis not present

## 2023-09-01 DIAGNOSIS — H401132 Primary open-angle glaucoma, bilateral, moderate stage: Secondary | ICD-10-CM | POA: Diagnosis not present

## 2023-09-05 ENCOUNTER — Encounter: Payer: Self-pay | Admitting: *Deleted

## 2023-09-23 ENCOUNTER — Telehealth: Payer: Self-pay

## 2023-09-23 DIAGNOSIS — R7309 Other abnormal glucose: Secondary | ICD-10-CM

## 2023-09-23 DIAGNOSIS — M109 Gout, unspecified: Secondary | ICD-10-CM

## 2023-09-23 DIAGNOSIS — Z8639 Personal history of other endocrine, nutritional and metabolic disease: Secondary | ICD-10-CM

## 2023-09-23 DIAGNOSIS — I10 Essential (primary) hypertension: Secondary | ICD-10-CM

## 2023-09-23 NOTE — Telephone Encounter (Signed)
 Copied from CRM [id number]. Topic: Clinical - Lab/Test Results >> Sep 23, 2023  3:01 PM Corin V wrote: Reason for CRM: Patient scheduled his physical for 12/12/23. Please have any labs needed for review at the appointment ordered and call patient back to schedule.

## 2023-09-28 NOTE — Addendum Note (Signed)
 Addended by: Joaquim Nam on: 09/28/2023 08:28 PM   Modules accepted: Orders

## 2023-09-28 NOTE — Telephone Encounter (Signed)
Ordered, please schedule.  Thanks!

## 2023-09-29 NOTE — Telephone Encounter (Signed)
 Left message for patient to call and schedule appointment for labs prior to his appt in March with Dr. Para March.

## 2023-10-13 ENCOUNTER — Telehealth: Payer: Self-pay

## 2023-10-13 ENCOUNTER — Other Ambulatory Visit: Payer: Self-pay

## 2023-10-13 DIAGNOSIS — R269 Unspecified abnormalities of gait and mobility: Secondary | ICD-10-CM

## 2023-10-13 MED ORDER — DOXAZOSIN MESYLATE 2 MG PO TABS: 2.0000 mg | ORAL_TABLET | Freq: Every day | ORAL | 0 refills | Status: DC

## 2023-10-13 MED ORDER — METOPROLOL TARTRATE 100 MG PO TABS: 100.0000 mg | ORAL_TABLET | Freq: Two times a day (BID) | ORAL | 0 refills | Status: DC

## 2023-10-13 MED ORDER — ATORVASTATIN CALCIUM 40 MG PO TABS: 40.0000 mg | ORAL_TABLET | Freq: Every day | ORAL | 0 refills | Status: DC

## 2023-10-13 MED ORDER — AMLODIPINE BESYLATE 10 MG PO TABS: 10.0000 mg | ORAL_TABLET | Freq: Every day | ORAL | 0 refills | Status: DC

## 2023-10-13 MED ORDER — VITAMIN B-12 1000 MCG PO TABS: 1000.0000 ug | ORAL_TABLET | Freq: Every day | ORAL | 0 refills | Status: AC

## 2023-10-13 NOTE — Telephone Encounter (Signed)
I did send refill request for 90 day supply with 0 refills on some medications. The expectation was Zyloprim and revatio

## 2023-10-13 NOTE — Telephone Encounter (Signed)
Copied from CRM [id number]. Topic: Clinical - Medication Question >> Oct 13, 2023 10:59 AM Almira Coaster wrote: Reason for CRM: Patient is now with [facility] through his insurance plan and they are requesting we send all his prescriptions to them for a year supply. Their phone number in case we need to speak with them is [phone].

## 2023-10-15 MED ORDER — ALLOPURINOL 100 MG PO TABS: 100.0000 mg | ORAL_TABLET | Freq: Every day | ORAL | 0 refills | Status: DC

## 2023-10-15 MED ORDER — SILDENAFIL CITRATE 20 MG PO TABS: 100.0000 mg | ORAL_TABLET | Freq: Every day | ORAL | 0 refills | Status: AC | PRN

## 2023-10-15 NOTE — Addendum Note (Signed)
Addended by: Joaquim Nam on: 10/15/2023 01:35 PM   Modules accepted: Orders

## 2023-10-15 NOTE — Telephone Encounter (Signed)
Patient notified

## 2023-10-15 NOTE — Telephone Encounter (Signed)
I sent allopurinol and sildenafil.  He had OV pending for 11/2023 and we can address yearlong refills at that point.  Thanks.

## 2023-12-01 ENCOUNTER — Other Ambulatory Visit (INDEPENDENT_AMBULATORY_CARE_PROVIDER_SITE_OTHER): Payer: PPO

## 2023-12-01 DIAGNOSIS — Z8639 Personal history of other endocrine, nutritional and metabolic disease: Secondary | ICD-10-CM | POA: Diagnosis not present

## 2023-12-01 DIAGNOSIS — H401132 Primary open-angle glaucoma, bilateral, moderate stage: Secondary | ICD-10-CM | POA: Diagnosis not present

## 2023-12-01 DIAGNOSIS — H353132 Nonexudative age-related macular degeneration, bilateral, intermediate dry stage: Secondary | ICD-10-CM | POA: Diagnosis not present

## 2023-12-01 DIAGNOSIS — H3561 Retinal hemorrhage, right eye: Secondary | ICD-10-CM | POA: Diagnosis not present

## 2023-12-01 DIAGNOSIS — M109 Gout, unspecified: Secondary | ICD-10-CM

## 2023-12-01 DIAGNOSIS — H353211 Exudative age-related macular degeneration, right eye, with active choroidal neovascularization: Secondary | ICD-10-CM | POA: Diagnosis not present

## 2023-12-01 DIAGNOSIS — R7309 Other abnormal glucose: Secondary | ICD-10-CM | POA: Diagnosis not present

## 2023-12-01 DIAGNOSIS — H353223 Exudative age-related macular degeneration, left eye, with inactive scar: Secondary | ICD-10-CM | POA: Diagnosis not present

## 2023-12-01 DIAGNOSIS — I10 Essential (primary) hypertension: Secondary | ICD-10-CM | POA: Diagnosis not present

## 2023-12-01 LAB — LIPID PANEL
Cholesterol: 133 mg/dL (ref 0–200)
HDL: 61.2 mg/dL (ref >39.00)
LDL Cholesterol: 49 mg/dL (ref 0–99)
NonHDL: 72.26
Total CHOL/HDL Ratio: 2
Triglycerides: 114 mg/dL (ref 0.0–149.0)
VLDL: 22.8 mg/dL (ref 0.0–40.0)

## 2023-12-01 LAB — COMPREHENSIVE METABOLIC PANEL
ALT: 21 U/L (ref 0–53)
AST: 13 U/L (ref 0–37)
Albumin: 4.2 g/dL (ref 3.5–5.2)
Alkaline Phosphatase: 69 U/L (ref 39–117)
BUN: 13 mg/dL (ref 6–23)
CO2: 30 meq/L (ref 19–32)
Calcium: 9.9 mg/dL (ref 8.4–10.5)
Chloride: 107 meq/L (ref 96–112)
Creatinine, Ser: 0.89 mg/dL (ref 0.40–1.50)
GFR: 82.69 mL/min (ref >60.00)
Glucose, Bld: 112 mg/dL — ABNORMAL HIGH (ref 70–99)
Potassium: 4 meq/L (ref 3.5–5.1)
Sodium: 143 meq/L (ref 135–145)
Total Bilirubin: 0.7 mg/dL (ref 0.2–1.2)
Total Protein: 6.6 g/dL (ref 6.0–8.3)

## 2023-12-01 LAB — HEMOGLOBIN A1C: Hgb A1c MFr Bld: 5.5 % (ref 4.6–6.5)

## 2023-12-01 LAB — URIC ACID: Uric Acid, Serum: 6.6 mg/dL (ref 4.0–7.8)

## 2023-12-01 LAB — VITAMIN B12: Vitamin B-12: 742 pg/mL (ref 211–911)

## 2023-12-11 ENCOUNTER — Ambulatory Visit (INDEPENDENT_AMBULATORY_CARE_PROVIDER_SITE_OTHER): Payer: Medicare HMO

## 2023-12-11 VITALS — Ht 69.0 in | Wt 194.0 lb

## 2023-12-11 DIAGNOSIS — Z0000 Encounter for general adult medical examination without abnormal findings: Secondary | ICD-10-CM | POA: Diagnosis not present

## 2023-12-11 NOTE — Patient Instructions (Signed)
 Alan [name] , Thank you for taking time to come for your Medicare Wellness Visit. I appreciate your ongoing commitment to your health goals. Please review the following plan we discussed and let me know if I can assist you in the future.   Referrals/Orders/Follow-Ups/Clinician Recommendations: none  This is a list of the screening recommended for you and due dates:  Health Maintenance  Topic Date Due   Zoster (Shingles) Vaccine (1 of 2) 06/15/1965   Colon Cancer Screening  08/04/2024   Medicare Annual Wellness Visit  12/10/2024   DTaP/Tdap/Td vaccine (4 - Td or Tdap) 06/25/2028   Pneumonia Vaccine  Completed   Flu Shot  Completed   Hepatitis C Screening  Completed   HPV Vaccine  Aged Out   COVID-19 Vaccine  Discontinued    Advanced directives: (Copy Requested) Please bring a copy of your health care power of attorney and living will to the office to be added to your chart at your convenience. You can mail to [facility] [address], [place] [address] or email to [email or web address]  Next Medicare Annual Wellness Visit scheduled for next year: Yes 12/15/23 @ 11:30am televisit

## 2023-12-11 NOTE — Progress Notes (Signed)
 Subjective:   Alan [name] is a 78 y.o. who presents for a Medicare Wellness preventive visit.  Visit Complete: Virtual I connected with  Alan [name] on 12/11/23 by a audio enabled telemedicine application and verified that I am speaking with the correct person using two identifiers.  Patient Location: Home  Provider Location: Office/Clinic  I discussed the limitations of evaluation and management by telemedicine. The patient expressed understanding and agreed to proceed.  Vital Signs: Because this visit was a virtual/telehealth visit, some criteria may be missing or patient reported. Any vitals not documented were not able to be obtained and vitals that have been documented are patient reported.  VideoDeclined- This patient declined Librarian, academic. Therefore the visit was completed with audio only.  Persons Participating in Visit: Patient.  AWV Questionnaire: Yes: Patient Medicare AWV questionnaire was completed by the patient on 12/10/23; I have confirmed that all information answered by patient is correct and no changes since this date.  Cardiac Risk Factors include: advanced age (>84men, >28 women);hypertension;dyslipidemia;male gender;sedentary lifestyle     Objective:    Today's Vitals   12/11/23 1141  Weight: 194 lb (88 kg)  Height: 5\' 9"  (1.753 m)   Body mass index is 28.65 kg/m.     12/11/2023   11:51 AM 12/09/2022    1:43 PM 12/05/2022   10:19 AM 08/29/2022   10:45 AM 08/04/2017    3:01 PM 08/21/2016    9:56 AM  Advanced Directives  Does Patient Have a Medical Advance Directive? Yes Yes Yes Yes No No  Type of Estate agent of [place];Living will Healthcare Power of [place];Living will Healthcare Power of [place];Living will Healthcare Power of [place];Living will;Out of facility DNR (pink MOST or yellow form)    Copy of Healthcare Power of Attorney in Chart? No - copy requested No - copy requested No -  copy requested       Current Medications (verified) Outpatient Encounter Medications as of 12/11/2023  Medication Sig   allopurinol (ZYLOPRIM) 100 MG tablet Take 1 tablet (100 mg total) by mouth daily.   amLODipine (NORVASC) 10 MG tablet Take 1 tablet (10 mg total) by mouth daily.   aspirin 81 MG tablet Take 81 mg by mouth daily.   atorvastatin (LIPITOR) 40 MG tablet Take 1 tablet (40 mg total) by mouth daily.   cyanocobalamin (VITAMIN B12) 1000 MCG tablet Take 1 tablet (1,000 mcg total) by mouth daily.   dorzolamide-timolol (COSOPT) 22.3-6.8 MG/ML ophthalmic solution 1 drop 2 (two) times daily.   doxazosin (CARDURA) 2 MG tablet Take 1 tablet (2 mg total) by mouth daily.   loratadine (CLARITIN) 10 MG tablet Take 10 mg by mouth daily.   metoprolol tartrate (LOPRESSOR) 100 MG tablet Take 1 tablet (100 mg total) by mouth 2 (two) times daily.   sildenafil (REVATIO) 20 MG tablet Take 5 tablets (100 mg total) by mouth daily as needed.   HYDROcodone bit-homatropine (HYCODAN) 5-1.5 MG/5ML syrup Take 5 mLs by mouth at bedtime as needed for cough. (Patient not taking: Reported on 12/11/2023)   No facility-administered encounter medications on file as of 12/11/2023.    Allergies (verified) Hctz [hydrochlorothiazide], Septra [sulfamethoxazole-trimethoprim], Spironolactone, and Valsartan   History: Past Medical History:  Diagnosis Date   CAD (coronary artery disease) 07/17/2004   Diverticulosis of colon 12/15/2005   Esophageal stricture    Gastritis    GERD (gastroesophageal reflux disease) 09/16/1996   Glaucoma    Heart disease  History of tobacco abuse    Quit 2000   Hx of cardiovascular stress test    ETT-Myoview (01/2014):  1.5 mm ST depression in inf leads at peak exercise; no ischemia or scar, EF 57%, Low Risk   Hyperlipidemia 03/17/1999   Hypertension 03/17/1999   Stress fracture of foot    right midfoot, per Dr. Thereasa Parkin 2012   Past Surgical History:  Procedure Laterality Date   CATARACT  EXTRACTION, BILATERAL     COLONOSCOPY  12/30/2005   Divertics, mild, 10 yrs   COLONOSCOPY  11/26/2006   Colitis biopsy neg   CORONARY ARTERY BYPASS GRAFT  07/27/2004   By Dr. Cornelius Moras with a LIMA to the LAD, RIMA to the distal right coronary artery, spahenous vein graft first diagonal, saphenous vein graft to the first circumflex marginal, sequential saphenous vein graft to the second   ESOPHAGOGASTRODUODENOSCOPY  10/08/2000   Stricture distal esoph, repeat dilation done 2012   ESOPHAGOGASTRODUODENOSCOPY  10/08/2000   Stricture distal esoph   I & D EXTREMITY Left 01/24/2017   Procedure: MINOR INCISION AND DRAINAGE LEFT THUMB;  Surgeon: Betha Loa, MD;  Location: [facility];  Service: Orthopedics;  Laterality: Left;   Family History  Problem Relation Age of Onset   Peripheral vascular disease Mother    Stroke Father        x2   Hypertension Brother    Heart attack Brother        MI   Colon polyps Brother    Heart disease Brother        MI   Hypertension Sister    Breast cancer Sister    Hypertension Sister    Hypertension Sister    Colon polyps Other    Diabetes Neg Hx    Depression Neg Hx    Alcohol abuse Neg Hx    Drug abuse Neg Hx    Prostate cancer Neg Hx    Colon cancer Neg Hx    Social History   Socioeconomic History   Marital status: Married    Spouse name: Not on file   Number of children: 2   Years of education: Not on file   Highest education level: Not on file  Occupational History   Occupation: Scientist, 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 Right Handed   Are you currently employed ? No    What is your current occupation?   Do you live at home alone? No   Who lives with you? Lives with wife    What type of home do you live in: 1 story or 2 story? Lives in a one story home    Caffeine none    Social Drivers of Health   Financial Resource Strain: Low Risk  (12/11/2023)  Overall Financial Resource Strain (CARDIA)    Difficulty of Paying Living Expenses: Not hard at all  Food Insecurity: No Food Insecurity (12/11/2023)   Hunger Vital Sign    Worried About Running Out of Food in the Last Year: Never true    Ran Out of Food in the Last Year: Never true  Transportation Needs: No Transportation Needs (12/11/2023)   PRAPARE - Administrator, Civil Service (Medical): No    Lack of Transportation (Non-Medical): No  Physical Activity: Inactive (12/11/2023)   Exercise Vital Sign    Days of Exercise per Week: 0 days    Minutes of Exercise per Session: 0 min  Stress: No Stress Concern Present (12/11/2023)   [employer] of Occupational Health - Occupational Stress Questionnaire    Feeling of Stress : Not at all  Social Connections: Moderately Integrated (12/11/2023)   Social Connection and Isolation Panel [NHANES]    Frequency of Communication with Friends and Family: More than three times a week    Frequency of Social Gatherings with Friends and Family: More than three times a week    Attends Religious Services: More than 4 times per year    Active Member of [employer] or Organizations: No    Attends Engineer, structural: Never    Marital Status: Married    Tobacco Counseling Counseling given: Not Answered    Clinical Intake:  Pre-visit preparation completed: Yes  Pain : No/denies pain     BMI - recorded: 28.65 Nutritional Status: BMI 25 -29  Overweight Nutritional Risks: None Diabetes: No  Lab Results  Component Value Date   HGBA1C 5.5 12/01/2023   HGBA1C 5.5 12/03/2022   HGBA1C 5.6 11/27/2021     How often do you need to have someone help you when you read instructions, pamphlets, or other written materials from your doctor or pharmacy?: 1 - Never  Interpreter Needed?: No  Comments: lives with wife Information entered by :: B.Dejay Kronk,LPN   Activities of Daily Living     12/10/2023   10:33 PM  In your present state of health, do you have any difficulty performing the following activities:  Hearing? 0  Vision? 0  Difficulty concentrating or making decisions? 0  Walking or climbing stairs? 0  Dressing or bathing? 0  Doing errands, shopping? 0  Preparing Food and eating ? N  Using the Toilet? N  In the past six months, have you accidently leaked urine? Y  Do you have problems with loss of bowel control? N  Managing your Medications? N  Managing your Finances? N  Housekeeping or managing your Housekeeping? N    Patient Care Team: Joaquim Nam, MD as PCP - General (Family Medicine) Rollene Rotunda, MD as PCP - Cardiology (Cardiology) Luciana Axe Alford Highland, MD as Consulting Physician (Ophthalmology) Cherlyn Roberts, MD as Consulting Physician (Dermatology) Dimitri Ped, MD as Consulting Physician (Surgery) Regal, Kirstie Peri, DPM as Consulting Physician (Podiatry) Rollene Rotunda, MD as Consulting Physician (Cardiology) Antony Madura, MD as Consulting Physician (Neurology)  Indicate any recent Medical Services you may have received from other than [facility] providers in the past year (date may be approximate).     Assessment:   This is a routine wellness examination for Wei.  Hearing/Vision screen Hearing Screening - Comments:: Pt says his hearing is fine Vision Screening - Comments:: Pt says his vision is good w/glasses for reading Dr Luciana Axe   Goals Addressed  This Visit's  Progress    Patient Stated       I would like to keep my regime and exercise a little more       Depression Screen     12/11/2023   11:47 AM 12/10/2022    9:59 AM 12/09/2022    1:42 PM 12/04/2021   10:01 AM 09/07/2020   11:30 AM 09/03/2019    9:55 AM 09/01/2018    9:52 AM  PHQ 2/9 Scores  PHQ - 2 Score 0 0 0 0 0 0 0  PHQ- 9 Score  0         Fall Risk     12/10/2023   10:33 PM 12/10/2022    9:59 AM 12/09/2022    1:35 PM 12/05/2022   10:19 AM 08/29/2022   10:45 AM  Fall Risk   Falls in the past year? 0 0 0 0 0  Number falls in past yr: 0 0 0 0 0  Injury with Fall? 0 0 0 0 0  Risk for fall due to : No Fall Risks No Fall Risks No Fall Risks    Follow up Education provided;Falls prevention discussed Falls evaluation completed Falls prevention discussed;Falls evaluation completed Falls evaluation completed Falls evaluation completed    MEDICARE RISK AT HOME:  Medicare Risk at Home Any stairs in or around the home?: (Patient-Rptd) No Home free of loose throw rugs in walkways, pet beds, electrical cords, etc?: (Patient-Rptd) No Adequate lighting in your home to reduce risk of falls?: (Patient-Rptd) Yes Life alert?: (Patient-Rptd) No Use of a cane, walker or w/c?: (Patient-Rptd) No Grab bars in the bathroom?: (Patient-Rptd) Yes Shower chair or bench in shower?: (Patient-Rptd) Yes Elevated toilet seat or a handicapped toilet?: (Patient-Rptd) Yes  TIMED UP AND GO:  Was the test performed?  No  Cognitive Function: 6CIT completed    08/21/2016   10:01 AM  MMSE - Mini Mental State Exam  Orientation to time 5  Orientation to Place 5  Registration 3  Attention/ Calculation 0  Recall 3  Language- name 2 objects 0  Language- repeat 1  Language- follow 3 step command 3  Language- read & follow direction 0  Write a sentence 0  Copy design 0  Total score 20        12/11/2023   11:53 AM 12/09/2022    1:45 PM  6CIT Screen  What Year? 0 points 0 points  What month? 0 points  0 points  What time? 0 points 0 points  Count back from 20 0 points 0 points  Months in reverse 0 points 0 points  Repeat phrase 0 points 0 points  Total Score 0 points 0 points    Immunizations Immunization History  Administered Date(s) Administered   Fluad Quad(high Dose 65+) 06/08/2019, 07/15/2020, 06/25/2022   Fluad Trivalent(High Dose 65+) 07/10/2023   Influenza Whole 06/16/2006, 06/12/2009, 06/15/2010   Influenza, High Dose Seasonal PF 06/20/2021   Influenza,inj,Quad PF,6+ Mos 06/11/2013, 06/15/2014, 05/31/2015, 05/24/2016, 06/05/2017, 06/25/2018   Pneumococcal Conjugate-13 08/18/2014   Pneumococcal Polysaccharide-23 07/23/2011   Td 03/21/1999, 07/13/2009   Tdap 06/25/2018   Zoster, Live 09/17/2011    Screening Tests Health Maintenance  Topic Date Due   Zoster Vaccines- Shingrix (1 of 2) 06/15/1965   Colonoscopy  08/04/2024   Medicare Annual Wellness (AWV)  12/10/2024   DTaP/Tdap/Td (4 - Td or Tdap) 06/25/2028   Pneumonia Vaccine 24+ Years old  Completed   INFLUENZA VACCINE  Completed   Hepatitis C  Screening  Completed   HPV VACCINES  Aged Out   COVID-19 Vaccine  Discontinued    Health Maintenance  Health Maintenance Due  Topic Date Due   Zoster Vaccines- Shingrix (1 of 2) 06[birth year]15/1965   Health Maintenance Items Addressed: None needed  Additional Screening:  Vision Screening: Recommended annual ophthalmology exams for early detection of glaucoma and other disorders of the eye.  Dental Screening: Recommended annual dental exams for proper oral hygiene  Community Resource Referral / Chronic Care Management: CRR required this visit?  No   CCM required this visit?  No    Plan:     I have personally reviewed and noted the following in the patient's chart:   Medical and social history Use of alcohol, tobacco or illicit drugs  Current medications and supplements including opioid prescriptions. Patient is not currently taking opioid  prescriptions. Functional ability and status Nutritional status Physical activity Advanced directives List of other physicians Hospitalizations, surgeries, and ER visits in previous 12 months Vitals Screenings to include cognitive, depression, and falls Referrals and appointments  In addition, I have reviewed and discussed with patient certain preventive protocols, quality metrics, and best practice recommendations. A written personalized care plan for preventive services as well as general preventive health recommendations were provided to patient.    Sue Lush, LPN   [id number]/42/[id number]   After Visit Summary: (MyChart) Due to this being a telephonic visit, the after visit summary with patients personalized plan was offered to patient via MyChart   Notes: Nothing significant to report at this time.

## 2023-12-12 ENCOUNTER — Encounter: Payer: Self-pay | Admitting: Family Medicine

## 2023-12-12 ENCOUNTER — Ambulatory Visit (INDEPENDENT_AMBULATORY_CARE_PROVIDER_SITE_OTHER): Payer: Medicare HMO | Admitting: Family Medicine

## 2023-12-12 VITALS — BP 110/60 | HR 52 | Temp 98.4°F | Ht 67.0 in | Wt 195.0 lb

## 2023-12-12 DIAGNOSIS — Z7189 Other specified counseling: Secondary | ICD-10-CM

## 2023-12-12 DIAGNOSIS — Z0000 Encounter for general adult medical examination without abnormal findings: Secondary | ICD-10-CM

## 2023-12-12 DIAGNOSIS — M109 Gout, unspecified: Secondary | ICD-10-CM

## 2023-12-12 DIAGNOSIS — E785 Hyperlipidemia, unspecified: Secondary | ICD-10-CM | POA: Diagnosis not present

## 2023-12-12 DIAGNOSIS — I10 Essential (primary) hypertension: Secondary | ICD-10-CM | POA: Diagnosis not present

## 2023-12-12 MED ORDER — ALLOPURINOL 100 MG PO TABS: 100.0000 mg | ORAL_TABLET | Freq: Every day | ORAL | 3 refills | Status: DC

## 2023-12-12 MED ORDER — VITAMIN B-1 50 MG PO TABS: 50.0000 mg | ORAL_TABLET | Freq: Every day | ORAL | Status: AC

## 2023-12-12 MED ORDER — AMLODIPINE BESYLATE 10 MG PO TABS: 10.0000 mg | ORAL_TABLET | Freq: Every day | ORAL | 3 refills | Status: DC

## 2023-12-12 MED ORDER — DOXAZOSIN MESYLATE 2 MG PO TABS: 2.0000 mg | ORAL_TABLET | Freq: Every day | ORAL | 3 refills | Status: DC

## 2023-12-12 MED ORDER — METOPROLOL TARTRATE 100 MG PO TABS: 100.0000 mg | ORAL_TABLET | Freq: Two times a day (BID) | ORAL | 3 refills | Status: DC

## 2023-12-12 MED ORDER — ATORVASTATIN CALCIUM 40 MG PO TABS: 40.0000 mg | ORAL_TABLET | Freq: Every day | ORAL | 3 refills | Status: DC

## 2023-12-12 NOTE — Patient Instructions (Addendum)
 If you get lightheaded, the cut doxazosin in half.  Stop if needed.  Update [place] as needed.  Take care.  Glad to see you. Check with your insurance to see if they will cover the shingles shot. Take care.  Glad to see you.

## 2023-12-12 NOTE — Progress Notes (Signed)
 Hypertension:    Using medication without problems or lightheadedness: yes Chest pain with exertion:no Edema:no Short of breath:no Recheck BP 110/60.   He doesn't have orthostatic sx.    H/o CAD, has cardiology f/u pending.   Elevated Cholesterol: Using medications without problems: yes Muscle aches: no Diet compliance: d/w pt.   Exercise: yes  Gout.  No flares.  Compliant with allopurinol.  Labs d/w pt.    He has routine eye clinic f/u.  D/w pt.   Flu 2024 Shingles discussed with patient.  Zostavax previously done. PNA up-to-date Tetanus 2019 COVID-vaccine previously done.   RSV d/w pt.  Colonoscopy 2018 Prostate cancer screening-deferred 2024.  Discussed with patient.  He agreed. Advance directive-updated 2023.  Son and daughter are equally designated to patient when capacitated.  Meds, vitals, and allergies reviewed.   ROS: Per HPI unless specifically indicated in ROS section   GEN: nad, alert and oriented HEENT: ncat NECK: supple w/o LA CV: rrr PULM: ctab, no inc wob ABD: soft, +bs EXT: no edema SKIN: no acute rash

## 2023-12-14 NOTE — Assessment & Plan Note (Signed)
 Flu 2024 Shingles discussed with patient.  Zostavax previously done. PNA up-to-date Tetanus 2019 COVID-vaccine previously done.   RSV d/w pt.  Colonoscopy 2018 Prostate cancer screening-deferred 2024.  Discussed with patient.  He agreed. Advance directive-updated 2023.  Son and daughter are equally designated to patient when capacitated.

## 2023-12-14 NOTE — Assessment & Plan Note (Addendum)
 Continue metoprolol doxazosin amlodipine. If lightheaded, the cut doxazosin in half.  He could stop doxazosin if needed.

## 2023-12-14 NOTE — Assessment & Plan Note (Signed)
Advance directive-updated 2023.  Son and daughter are equally designated to patient when capacitated.

## 2023-12-14 NOTE — Assessment & Plan Note (Signed)
 No flares.  Compliant with allopurinol.  Labs d/w pt.   Continue as is.

## 2023-12-14 NOTE — Assessment & Plan Note (Signed)
 Continue atorvastatin

## 2024-01-02 ENCOUNTER — Telehealth: Admitting: Physician Assistant

## 2024-01-02 ENCOUNTER — Ambulatory Visit: Payer: Self-pay

## 2024-01-02 DIAGNOSIS — J301 Allergic rhinitis due to pollen: Secondary | ICD-10-CM | POA: Diagnosis not present

## 2024-01-02 DIAGNOSIS — R051 Acute cough: Secondary | ICD-10-CM

## 2024-01-02 MED ORDER — PROMETHAZINE-DM 6.25-15 MG/5ML PO SYRP: 5.0000 mL | ORAL_SOLUTION | Freq: Four times a day (QID) | ORAL | 0 refills | Status: DC | PRN

## 2024-01-02 MED ORDER — IPRATROPIUM BROMIDE 0.03 % NA SOLN: 2.0000 | Freq: Two times a day (BID) | NASAL | 0 refills | Status: AC

## 2024-01-02 NOTE — Patient Instructions (Signed)
 Alan [name], thank you for joining [name], PA-C for today's virtual visit.  While this provider is not your primary care provider (PCP), if your PCP is located in our provider database this encounter information will be shared with them immediately following your visit.   A [facility] MyChart account gives you access to today's visit and all your visits, tests, and labs performed at [facility] " click here if you don't have a [facility] MyChart account or go to [email or web address]  Consent: (Patient) Alan [name] provided verbal consent for this virtual visit at the beginning of the encounter.  Current Medications:  Current Outpatient Medications:    ipratropium (ATROVENT ) 0.03 % nasal spray, Place 2 sprays into both nostrils every 12 (twelve) hours., Disp: 30 mL, Rfl: 0   promethazine -dextromethorphan (PROMETHAZINE -DM) 6.25-15 MG/5ML syrup, Take 5 mLs by mouth 4 (four) times daily as needed., Disp: 118 mL, Rfl: 0   allopurinol  (ZYLOPRIM ) 100 MG tablet, Take 1 tablet (100 mg total) by mouth daily., Disp: 90 tablet, Rfl: 3   amLODipine  (NORVASC ) 10 MG tablet, Take 1 tablet (10 mg total) by mouth daily., Disp: 90 tablet, Rfl: 3   aspirin 81 MG tablet, Take 81 mg by mouth daily., Disp: , Rfl:    atorvastatin  (LIPITOR) 40 MG tablet, Take 1 tablet (40 mg total) by mouth daily., Disp: 90 tablet, Rfl: 3   cyanocobalamin  (VITAMIN B12) 1000 MCG tablet, Take 1 tablet (1,000 mcg total) by mouth daily., Disp: 90 tablet, Rfl: 0   dorzolamide-timolol (COSOPT) 22.3-6.8 MG/ML ophthalmic solution, 1 drop 2 (two) times daily., Disp: , Rfl:    doxazosin  (CARDURA ) 2 MG tablet, Take 1 tablet (2 mg total) by mouth daily., Disp: 90 tablet, Rfl: 3   loratadine (CLARITIN) 10 MG tablet, Take 10 mg by mouth daily., Disp: , Rfl:    metoprolol  tartrate (LOPRESSOR ) 100 MG tablet, Take 1 tablet (100 mg total) by mouth 2 (two) times daily., Disp: 180 tablet, Rfl: 3   sildenafil   (REVATIO ) 20 MG tablet, Take 5 tablets (100 mg total) by mouth daily as needed., Disp: 50 tablet, Rfl: 0   thiamine  (VITAMIN B-1) 50 MG tablet, Take 1 tablet (50 mg total) by mouth daily., Disp: , Rfl:    Medications ordered in this encounter:  Meds ordered this encounter  Medications   ipratropium (ATROVENT ) 0.03 % nasal spray    Sig: Place 2 sprays into both nostrils every 12 (twelve) hours.    Dispense:  30 mL    Refill:  0    Supervising Provider:   LAMPTEY, PHILIP O [[id number]]   promethazine -dextromethorphan (PROMETHAZINE -DM) 6.25-15 MG/5ML syrup    Sig: Take 5 mLs by mouth 4 (four) times daily as needed.    Dispense:  118 mL    Refill:  0    Supervising Provider:   Corine Dice [[id number]]     *If you need refills on other medications prior to your next appointment, please contact your pharmacy*  Follow-Up: Call back or seek an in-person evaluation if the symptoms worsen or if the condition fails to improve as anticipated.  [facility] [phone]  Other Instructions Postnasal Drip Postnasal drip is the feeling of mucus going down the back of your throat. Mucus is a slimy substance that moistens and cleans your nose and throat, as well as the air pockets in face bones near your forehead and cheeks (sinuses). Small amounts of mucus pass from your nose and sinuses down  the back of your throat all the time. This is normal. When you produce too much mucus or the mucus gets too thick, you can feel it. Some common causes of postnasal drip include: Having more mucus because of: A cold or the flu. Allergies. Cold air. Certain medicines. Gastroesophageal reflux. Having more mucus that is thicker because of: A sinus or nasal infection. Dry air. A food allergy. Follow these instructions at home: Relieving discomfort  Gargle with a mixture of salt and water 3-4 times a day or as needed. To make salt water, completely dissolve -1 tsp (3-6 g) of salt in 1 cup (237  mL) of warm water. If the air in your home is dry, use a humidifier to add moisture to the air. Use a saline spray or a container (neti pot) to flush out the nose (nasal irrigation). These methods can help clear away mucus and keep the nasal passages moist. General instructions Take over-the-counter and prescription medicines only as told by your health care provider. Follow instructions from your health care provider about eating or drinking restrictions. You may need to avoid caffeine. Avoid things that you know you are allergic to (allergens), like dust, mold, pollen, pets, or certain foods. Drink enough fluid to keep your urine pale yellow. Keep all follow-up visits. This is important. Contact a health care provider if: You have a fever. You have a sore throat or difficulty swallowing. You have a headache. You have sinus or ear pain. You have a cough that does not go away. The mucus from your nose becomes thick and is green or yellow in color. You have cold or flu symptoms that last more than 10 days. Summary Postnasal drip is the feeling of mucus going down the back of your throat. Use nasal irrigation or a nasal spray to help clear away mucus and keep the nasal passages moist. Avoid things that you know you are allergic to (allergens), like dust, mold, pollen, pets, or certain foods. This information is not intended to replace advice given to you by your health care provider. Make sure you discuss any questions you have with your health care provider. Document Revised: 08/02/2021 Document Reviewed: 08/02/2021 Elsevier Patient Education  2024 Elsevier Inc.   If you have been instructed to have an in-person evaluation today at a local Urgent Care facility, please use the link below. It will take you to a list of all of our available [facility], including address, phone number and hours of operation. Please do not delay care.  [facility]  If you or a  family member do not have a primary care provider, use the link below to schedule a visit and establish care. When you choose a [facility] primary care physician or advanced practice provider, you gain a long-term partner in health. Find a Primary Care Provider  Learn more about [facility]'s in-office and virtual care options: [facility] - Get Care Now

## 2024-01-02 NOTE — Progress Notes (Signed)
 Virtual Visit Consent   Alan [name], you are scheduled for a virtual visit with a [facility] provider today. Just as with appointments in the office, your consent must be obtained to participate. Your consent will be active for this visit and any virtual visit you may have with one of our providers in the next 365 days. If you have a MyChart account, a copy of this consent can be sent to you electronically.  As this is a virtual visit, video technology does not allow for your provider to perform a traditional examination. This may limit your provider's ability to fully assess your condition. If your provider identifies any concerns that need to be evaluated in person or the need to arrange testing (such as labs, EKG, etc.), we will make arrangements to do so. Although advances in technology are sophisticated, we cannot ensure that it will always work on either your end or our end. If the connection with a video visit is poor, the visit may have to be switched to a telephone visit. With either a video or telephone visit, we are not always able to ensure that we have a secure connection.  By engaging in this virtual visit, you consent to the provision of healthcare and authorize for your insurance to be billed (if applicable) for the services provided during this visit. Depending on your insurance coverage, you may receive a charge related to this service.  I need to obtain your verbal consent now. Are you willing to proceed with your visit today? Alan [name] has provided verbal consent on 01/02/2024 for a virtual visit (video or telephone). [name], PA-C  Date: 01/02/2024 12:47 PM   Virtual Visit via Video Note   I, [name], connected with  Alan [name]  ([id number], 11-10-1945) on 01/02/24 at 12:45 PM EDT by a video-enabled telemedicine application and verified that I am speaking with the correct person using two identifiers.  Location: Patient: Virtual Visit  Location Patient: Home Provider: Virtual Visit Location Provider: Home Office   I discussed the limitations of evaluation and management by telemedicine and the availability of in person appointments. The patient expressed understanding and agreed to proceed.    History of Present Illness: Alan [name] is a 78 y.o. who identifies as a male who was assigned male at birth, and is being seen today for cough and drainage.  HPI: URI  This is a new problem. The current episode started in the past 7 days (3 days ago; had been working outside prior to onset). The problem has been gradually worsening. There has been no fever. Associated symptoms include congestion, coughing (productive), headaches, rhinorrhea (at night), sneezing, a sore throat (from coughing) and wheezing (no worse than baseline). Pertinent negatives include no chest pain, diarrhea, ear pain, nausea, plugged ear sensation, sinus pain or vomiting. Associated symptoms comments: Post nasal drainage. He has tried antihistamine (delsym, loratadine 10mg ) for the symptoms. The treatment provided no relief.     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   Advance care planning 08/21/2014  Skin lesion 08/06/2012   Carotid stenosis 11/26/2011   AK (actinic keratosis) 07/25/2011   Gastritis and gastroduodenitis 10/08/2010   ESOPHAGEAL STRICTURE 09/07/2010   OTHER DYSPHAGIA 07/17/2010   Overweight 11/21/2008   COLITIS 07/07/2008   TESTOSTERONE  DEFICIENCY 06/04/2007   Unspecified glaucoma 06/04/2007   ERECTILE DYSFUNCTION, 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     Septra [Sulfamethoxazole-Trimethoprim]     diarrhea   Spironolactone  Other (See Comments)    Possible cause of aches.     Valsartan  Other (See Comments)    Possible cause of aches.     Medications:  Current Outpatient Medications:    allopurinol  (ZYLOPRIM ) 100 MG tablet, Take 1 tablet (100 mg total) by mouth daily., Disp: 90 tablet, Rfl: 3   amLODipine  (NORVASC ) 10 MG tablet, Take 1 tablet (10 mg total) by mouth daily., Disp: 90 tablet, Rfl: 3   aspirin 81 MG tablet, Take 81 mg by mouth daily., Disp: , Rfl:    atorvastatin  (LIPITOR) 40 MG tablet, Take 1 tablet (40 mg total) by mouth daily., Disp: 90 tablet, Rfl: 3   cyanocobalamin  (VITAMIN B12) 1000 MCG tablet, Take 1 tablet (1,000 mcg total) by mouth daily., Disp: 90 tablet, Rfl: 0   dorzolamide-timolol (COSOPT) 22.3-6.8 MG/ML ophthalmic solution, 1 drop 2 (two) times daily., Disp: , Rfl:    doxazosin  (CARDURA ) 2 MG tablet, Take 1 tablet (2 mg total) by mouth daily., Disp: 90 tablet, Rfl: 3   loratadine (CLARITIN) 10 MG tablet, Take 10 mg by mouth daily., Disp: , Rfl:    metoprolol  tartrate (LOPRESSOR ) 100 MG tablet, Take 1 tablet (100 mg total) by mouth 2 (two) times daily., Disp: 180 tablet, Rfl: 3   sildenafil  (REVATIO ) 20 MG tablet, Take 5 tablets (100 mg total) by mouth daily as needed., Disp: 50 tablet, Rfl: 0   thiamine  (VITAMIN B-1) 50 MG tablet, Take 1 tablet (50 mg total) by mouth daily., Disp: , Rfl:   Observations/Objective: Patient is well-developed,  well-nourished in no acute distress.  Resting comfortably at home.  Head is normocephalic, atraumatic.  No labored breathing.  Speech is clear and coherent with logical content.  Patient is alert and oriented at baseline.    Assessment and Plan: There are no diagnoses linked to this encounter. - Suspect cough from post nasal drainage - Will treat with Ipratropium bromide  nasal spray and Promethazine  DM cough syrup - Can continue Mucinex  (PLAIN) if needed - Continue allergy medications - Push fluids.  - Rest.  - Steam and humidifier can help - Seek in person evaluation if worsening or symptoms fail to improve    Follow Up Instructions: I discussed the assessment and treatment plan with the patient. The patient was provided an opportunity to ask questions and all were answered. The patient agreed with the plan and demonstrated an understanding of the instructions.  A copy of instructions were sent to the patient via MyChart unless otherwise noted below.    The patient was advised to call back or seek an in-person evaluation if the symptoms worsen or if the condition fails to improve as anticipated.    [name], PA-C

## 2024-01-02 NOTE — Telephone Encounter (Signed)
     Chief Complaint: Productive cough Symptoms: Above Frequency: 3 days Pertinent Negatives: Patient denies fever Disposition: [] ED /[] Urgent Care (no appt availability in office) / [] Appointment(In office/virtual)/ [x]  [facility]/ [] Home Care/ [] Refused Recommended Disposition /[] [facility]/ []  Follow-up with PCP Additional Notes: Agrees with VV.  Reason for Disposition  [1] Continuous (nonstop) coughing interferes with work or school AND [2] no improvement using cough treatment per Care Advice  Answer Assessment - Initial Assessment Questions 1. ONSET: When did the cough begin?      3 days 2. SEVERITY: How bad is the cough today?      Severe 3. SPUTUM: Describe the color of your sputum (none, dry cough; clear, white, yellow, green)     Yellow- green 4. HEMOPTYSIS: Are you coughing up any blood? If so ask: How much? (flecks, streaks, tablespoons, etc.)     No 5. DIFFICULTY BREATHING: Are you having difficulty breathing? If Yes, ask: How bad is it? (e.g., mild, moderate, severe)    - MILD: No SOB at rest, mild SOB with walking, speaks normally in sentences, can lie down, no retractions, pulse < 100.    - MODERATE: SOB at rest, SOB with minimal exertion and prefers to sit, cannot lie down flat, speaks in phrases, mild retractions, audible wheezing, pulse 100-120.    - SEVERE: Very SOB at rest, speaks in single words, struggling to breathe, sitting hunched forward, retractions, pulse > 120      No 6. FEVER: Do you have a fever? If Yes, ask: What is your temperature, how was it measured, and when did it start?     No 7. CARDIAC HISTORY: Do you have any history of heart disease? (e.g., heart attack, congestive heart failure)      Yes 8. LUNG HISTORY: Do you have any history of lung disease?  (e.g., pulmonary embolus, asthma, emphysema)     No 9. PE RISK FACTORS: Do you have a history of blood clots? (or: recent major surgery, recent  prolonged travel, bedridden)     No 10. OTHER SYMPTOMS: Do you have any other symptoms? (e.g., runny nose, wheezing, chest pain)       Runny nose 11. PREGNANCY: Is there any chance you are pregnant? When was your last menstrual period?       N/a 12. TRAVEL: Have you traveled out of the country in the last month? (e.g., travel history, exposures)       No  Protocols used: Cough - Acute Productive-A-AH

## 2024-01-05 NOTE — Telephone Encounter (Signed)
 Noted. Thanks.

## 2024-01-07 ENCOUNTER — Ambulatory Visit (INDEPENDENT_AMBULATORY_CARE_PROVIDER_SITE_OTHER): Admitting: Family

## 2024-01-07 ENCOUNTER — Encounter: Payer: Self-pay | Admitting: Family

## 2024-01-07 VITALS — BP 108/62 | HR 54 | Temp 98.5°F | Ht 69.0 in | Wt 191.4 lb

## 2024-01-07 DIAGNOSIS — J22 Unspecified acute lower respiratory infection: Secondary | ICD-10-CM | POA: Insufficient documentation

## 2024-01-07 MED ORDER — GUAIFENESIN-CODEINE 100-10 MG/5ML PO SOLN: 5.0000 mL | Freq: Three times a day (TID) | ORAL | 0 refills | Status: AC | PRN

## 2024-01-07 MED ORDER — DOXYCYCLINE HYCLATE 100 MG PO TABS: 100.0000 mg | ORAL_TABLET | Freq: Two times a day (BID) | ORAL | 0 refills | Status: AC

## 2024-01-07 NOTE — Progress Notes (Signed)
 Established Patient Office Visit  Subjective:   Patient ID: Alan [name], male    DOB: December 24, 1945  Age: 78 y.o. MRN: [id number]  CC:  Chief Complaint  Patient presents with   Acute Visit    Cough and congestion x8 days    HPI: Alan [name] is a 78 y.o. male presenting on 01/07/2024 for Acute Visit (Cough and congestion x8 days)  C/o 8 day symptoms of cough, chest congestion. Keeping him up all night. No nasal congestion sore throat or ear pain. No fever. No sob some wheezing at times. Cough is productive with some green/yellow at other times clear.   Used some otc nose spray unsure of name but did give some relief.   Was seen virtually last week and was given atrovent  nose spray and promethazine  DM cough syrup.      ROS: Negative unless specifically indicated above in HPI.   Relevant past medical history reviewed and updated as indicated.   Allergies and medications reviewed and updated.   Current Outpatient Medications:    allopurinol  (ZYLOPRIM ) 100 MG tablet, Take 1 tablet (100 mg total) by mouth daily., Disp: 90 tablet, Rfl: 3   amLODipine  (NORVASC ) 10 MG tablet, Take 1 tablet (10 mg total) by mouth daily., Disp: 90 tablet, Rfl: 3   aspirin 81 MG tablet, Take 81 mg by mouth daily., Disp: , Rfl:    atorvastatin  (LIPITOR) 40 MG tablet, Take 1 tablet (40 mg total) by mouth daily., Disp: 90 tablet, Rfl: 3   cyanocobalamin  (VITAMIN B12) 1000 MCG tablet, Take 1 tablet (1,000 mcg total) by mouth daily., Disp: 90 tablet, Rfl: 0   dorzolamide-timolol (COSOPT) 22.3-6.8 MG/ML ophthalmic solution, 1 drop 2 (two) times daily., Disp: , Rfl:    doxazosin  (CARDURA ) 2 MG tablet, Take 1 tablet (2 mg total) by mouth daily., Disp: 90 tablet, Rfl: 3   doxycycline  (VIBRA -TABS) 100 MG tablet, Take 1 tablet (100 mg total) by mouth 2 (two) times daily for 10 days., Disp: 20 tablet, Rfl: 0   guaiFENesin -codeine  100-10 MG/5ML syrup, Take 5 mLs by mouth 3 (three) times daily as needed for up to  5 days., Disp: 75 mL, Rfl: 0   ipratropium (ATROVENT ) 0.03 % nasal spray, Place 2 sprays into both nostrils every 12 (twelve) hours., Disp: 30 mL, Rfl: 0   loratadine (CLARITIN) 10 MG tablet, Take 10 mg by mouth daily., Disp: , Rfl:    metoprolol  tartrate (LOPRESSOR ) 100 MG tablet, Take 1 tablet (100 mg total) by mouth 2 (two) times daily., Disp: 180 tablet, Rfl: 3   sildenafil  (REVATIO ) 20 MG tablet, Take 5 tablets (100 mg total) by mouth daily as needed., Disp: 50 tablet, Rfl: 0   thiamine  (VITAMIN B-1) 50 MG tablet, Take 1 tablet (50 mg total) by mouth daily., Disp: , Rfl:   Allergies  Allergen Reactions   Hctz [Hydrochlorothiazide ] Other (See Comments)    Would avoid due to h/o gout.     Septra [Sulfamethoxazole-Trimethoprim]     diarrhea   Spironolactone  Other (See Comments)    Possible cause of aches.     Valsartan  Other (See Comments)    Possible cause of aches.      Objective:   BP 108/62 (BP Location: Left Arm, Patient Position: Sitting, Cuff Size: Normal)   Pulse (!) 54   Temp 98.5 F (36.9 C) (Temporal)   Ht 5\' 9"  (1.753 m)   Wt 191 lb 6.4 oz (86.8 kg)   SpO2 94%  BMI 28.26 kg/m    Physical Exam Vitals reviewed.  Constitutional:      General: He is not in acute distress.    Appearance: Normal appearance. He is obese. He is not ill-appearing, toxic-appearing or diaphoretic.  HENT:     Head: Normocephalic.     Right Ear: Tympanic membrane normal.     Left Ear: Tympanic membrane normal.     Nose: Nose normal.     Mouth/Throat:     Mouth: Mucous membranes are moist.  Eyes:     Pupils: Pupils are equal, round, and reactive to light.  Cardiovascular:     Rate and Rhythm: Normal rate and regular rhythm.  Pulmonary:     Effort: Pulmonary effort is normal.     Breath sounds: Normal breath sounds. No wheezing.  Musculoskeletal:        General: Normal range of motion.     Cervical back: Normal range of motion.  Neurological:     General: No focal deficit  present.     Mental Status: He is alert and oriented to person, place, and time. Mental status is at baseline.  Psychiatric:        Mood and Affect: Mood normal.        Behavior: Behavior normal.        Thought Content: Thought content normal.        Judgment: Judgment normal.     Assessment & Plan:  Lower respiratory infection Assessment & Plan: Take antibiotic as prescribed. Increase oral fluids. Pt to f/u if sx worsen and or fail to improve in 2-3 days.  RX doxycycline  100 mg po bid x 10 days Night time cough medication prescribed, cautioned on drowsiness.  Mucinex  per pt makes symptoms worse.  Continue nasocort for allergy symptoms.  Orders: -     Doxycycline  Hyclate; Take 1 tablet (100 mg total) by mouth 2 (two) times daily for 10 days.  Dispense: 20 tablet; Refill: 0 -     guaiFENesin -Codeine ; Take 5 mLs by mouth 3 (three) times daily as needed for up to 5 days.  Dispense: 75 mL; Refill: 0     Follow up plan: No follow-ups on file.  Felicita Horns, FNP

## 2024-01-07 NOTE — Progress Notes (Deleted)
  Cardiology Office Note:   Date:  01/07/2024  ID:  Alan [name], DOB June 07, 1946, MRN [id number] PCP: Donnie Galea, MD  [facility] Providers Cardiologist:  Eilleen Grates, MD {  History of Present Illness:   Alan [name] is a 78 y.o. male who presents for follow of CAD status post CABG.  Since I last saw him ***   ***  he has done well from a cardiovascular standpoint.  Still taking care of his wife who really needs a lot of care ever since she had a femur fracture and nerve injury a few years ago.  He did a little bit of gym work in February or so but has not been exercising as much at home as I would like despite the fact that he has all of the equipment including a bike and a treadmill.  When he has been exercising he denies any cardiovascular symptoms.  The patient denies any new symptoms such as chest discomfort, neck or arm discomfort. There has been no new shortness of breath, PND or orthopnea. There have been no reported palpitations, presyncope or syncope.     ROS: ***  Studies Reviewed:    EKG:       ***  Risk Assessment/Calculations:   {Does this patient have ATRIAL FIBRILLATION?:[id number]}          Physical Exam:   VS:  There were no vitals taken for this visit.   Wt Readings from Last 3 Encounters:  01/07/24 191 lb 6.4 oz (86.8 kg)  12/12/23 195 lb (88.5 kg)  12/11/23 194 lb (88 kg)     GEN: Well nourished, well developed in no acute distress NECK: No JVD; No carotid bruits CARDIAC: ***RR, *** murmurs, rubs, gallops RESPIRATORY:  Clear to auscultation without rales, wheezing or rhonchi  ABDOMEN: Soft, non-tender, non-distended EXTREMITIES:  No edema; No deformity   ASSESSMENT AND PLAN:   CAD:   ***  The patient has no new sypmtoms.  No further cardiovascular testing is indicated.  We will continue with aggressive risk reduction and meds as listed.   HTN:    The blood pressure is ***  mildly elevated but he is going to keep a blood pressure  diary.  He did not take his medicines yet this morning.  A   CAROTID STENOSIS:    This was moderate in Dec 2024.  I will follow up with repeat Doppler in December.  ***   He will have follow-up in December.   CHOLESTEROL:   LDL was ***  57 with an HDL of 64.  No change in therapy.  55 with an HDL of 66.  No change in therapy.           Follow up ***  Signed, Eilleen Grates, MD

## 2024-01-07 NOTE — Assessment & Plan Note (Signed)
 Take antibiotic as prescribed. Increase oral fluids. Pt to f/u if sx worsen and or fail to improve in 2-3 days.  RX doxycycline  100 mg po bid x 10 days Night time cough medication prescribed, cautioned on drowsiness.  Mucinex  per pt makes symptoms worse.  Continue nasocort for allergy symptoms.

## 2024-01-08 ENCOUNTER — Ambulatory Visit: Payer: Medicare HMO | Admitting: Cardiology

## 2024-01-08 DIAGNOSIS — I6523 Occlusion and stenosis of bilateral carotid arteries: Secondary | ICD-10-CM

## 2024-01-08 DIAGNOSIS — I10 Essential (primary) hypertension: Secondary | ICD-10-CM

## 2024-01-08 DIAGNOSIS — E785 Hyperlipidemia, unspecified: Secondary | ICD-10-CM

## 2024-01-08 DIAGNOSIS — I2510 Atherosclerotic heart disease of native coronary artery without angina pectoris: Secondary | ICD-10-CM

## 2024-02-10 DIAGNOSIS — H353211 Exudative age-related macular degeneration, right eye, with active choroidal neovascularization: Secondary | ICD-10-CM | POA: Diagnosis not present

## 2024-02-10 DIAGNOSIS — H3561 Retinal hemorrhage, right eye: Secondary | ICD-10-CM | POA: Diagnosis not present

## 2024-02-10 DIAGNOSIS — H401132 Primary open-angle glaucoma, bilateral, moderate stage: Secondary | ICD-10-CM | POA: Diagnosis not present

## 2024-02-10 DIAGNOSIS — H353223 Exudative age-related macular degeneration, left eye, with inactive scar: Secondary | ICD-10-CM | POA: Diagnosis not present

## 2024-02-10 DIAGNOSIS — H353132 Nonexudative age-related macular degeneration, bilateral, intermediate dry stage: Secondary | ICD-10-CM | POA: Diagnosis not present

## 2024-02-19 NOTE — Progress Notes (Unsigned)
  Cardiology Office Note:   Date:  02/20/2024  ID:  CRIXUS MCAULAY, DOB 02/14/1946, MRN [id number] PCP: Donnie Galea, MD  [facility] Providers Cardiologist:  Eilleen Grates, MD {  History of Present Illness:   Alan [name] is a 78 y.o. male  who presents for follow of CAD status post CABG.  Since I last saw him he has had no new cardiovascular problems.  He tries to stay active.  He has many grandchildren he plays with and he says that is his biggest activity. The patient denies any new symptoms such as chest discomfort, neck or arm discomfort. There has been no new shortness of breath, PND or orthopnea. There have been no reported palpitations, presyncope or syncope.  His wife has had a femur fracture nerve injury and has had a lot of problems with this.  Has taken up some of his time.  ROS: As stated in the HPI and negative for all other systems.  Studies Reviewed:    EKG:   EKG Interpretation Date/Time:  Friday February 20 2024 10:22:55 EDT Ventricular Rate:  48 PR Interval:  156 QRS Duration:  130 QT Interval:  482 QTC Calculation: 430 R Axis:   35  Text Interpretation: Sinus bradycardia Non-specific intra-ventricular conduction block When compared with ECG of 01/09/23 No significant change since last tracing Confirmed by Eilleen Grates ([id number]) on 02/20/2024 10:43:01 AM    Risk Assessment/Calculations:              Physical Exam:   VS:  BP 126/66 (BP Location: Right Arm, Patient Position: Sitting, Cuff Size: Normal)   Pulse (!) 48   Ht 5\' 8"  (1.727 m)   Wt 187 lb 3.2 oz (84.9 kg)   SpO2 97%   BMI 28.46 kg/m    Wt Readings from Last 3 Encounters:  02/20/24 187 lb 3.2 oz (84.9 kg)  01/07/24 191 lb 6.4 oz (86.8 kg)  12/12/23 195 lb (88.5 kg)     GEN: Well nourished, well developed in no acute distress NECK: No JVD; left carotid bruits CARDIAC: RRR, no murmurs, rubs, gallops RESPIRATORY:  Clear to auscultation without rales, wheezing or rhonchi  ABDOMEN:  Soft, non-tender, non-distended EXTREMITIES:  No edema; No deformity   ASSESSMENT AND PLAN:   CAD:   The patient has no new sypmtoms.  No further cardiovascular testing is indicated.  We will continue with aggressive risk reduction and meds as listed.  HTN:    The blood pressure is at target.  No change in therapy.    CAROTID STENOSIS:    This was  40 - 59% left in December and he will have repeat Doppler in Dec.    CHOLESTEROL:   LDL was 49 with an HDL of 59.5.  No change in therapy.  57 with an HDL of 64.  No change in therapy.  55 with an HDL of 66.  No change in therapy.      Follow up with me in 1 year  Signed, Eilleen Grates, MD

## 2024-02-20 ENCOUNTER — Ambulatory Visit: Attending: Cardiology | Admitting: Cardiology

## 2024-02-20 ENCOUNTER — Encounter: Payer: Self-pay | Admitting: Cardiology

## 2024-02-20 VITALS — BP 126/66 | HR 48 | Ht 68.0 in | Wt 187.2 lb

## 2024-02-20 DIAGNOSIS — I2510 Atherosclerotic heart disease of native coronary artery without angina pectoris: Secondary | ICD-10-CM

## 2024-02-20 NOTE — Patient Instructions (Signed)

## 2024-03-03 DIAGNOSIS — L814 Other melanin hyperpigmentation: Secondary | ICD-10-CM | POA: Diagnosis not present

## 2024-03-03 DIAGNOSIS — L821 Other seborrheic keratosis: Secondary | ICD-10-CM | POA: Diagnosis not present

## 2024-03-03 DIAGNOSIS — L570 Actinic keratosis: Secondary | ICD-10-CM | POA: Diagnosis not present

## 2024-03-03 DIAGNOSIS — D225 Melanocytic nevi of trunk: Secondary | ICD-10-CM | POA: Diagnosis not present

## 2024-03-08 ENCOUNTER — Ambulatory Visit: Admitting: Podiatry

## 2024-03-08 ENCOUNTER — Encounter: Payer: Self-pay | Admitting: Podiatry

## 2024-03-08 VITALS — Ht 68.0 in | Wt 187.2 lb

## 2024-03-08 DIAGNOSIS — S91204A Unspecified open wound of right lesser toe(s) with damage to nail, initial encounter: Secondary | ICD-10-CM

## 2024-03-08 NOTE — Progress Notes (Signed)
 Chief Complaint  Patient presents with   Nail Problem    Pt is here to due to right pinky toenail has come off last week sometime, states wife wants him to get it checked out so it wont get infected, states no pain or drainage.    HPI: 78 y.o. male presenting today for evaluation of loss of toenail to the right fifth digit.  This occurred on Thursday, 03/04/2024.  Idiopathic.  Denies any injury.  He says that it was bleeding significantly but since the initial loss of the toenail the bleeding has resolved.  He says that his wife wanted him to have it evaluated  Past Medical History:  Diagnosis Date   CAD (coronary artery disease) 07/17/2004   Diverticulosis of colon 12/15/2005   Esophageal stricture    Gastritis    GERD (gastroesophageal reflux disease) 09/16/1996   Glaucoma    Heart disease    History of tobacco abuse    Quit 2000   Hx of cardiovascular stress test    ETT-Myoview (01/2014):  1.5 mm ST depression in inf leads at peak exercise; no ischemia or scar, EF 57%, Low Risk   Hyperlipidemia 03/17/1999   Hypertension 03/17/1999   Stress fracture of foot    right midfoot, per Dr. Red 2012    Past Surgical History:  Procedure Laterality Date   CATARACT EXTRACTION, BILATERAL     COLONOSCOPY  12/30/2005   Divertics, mild, 10 yrs   COLONOSCOPY  11/26/2006   Colitis biopsy neg   CORONARY ARTERY BYPASS GRAFT  07/27/2004   By Dr. Dusty with a LIMA to the LAD, RIMA to the distal right coronary artery, spahenous vein graft first diagonal, saphenous vein graft to the first circumflex marginal, sequential saphenous vein graft to the second   ESOPHAGOGASTRODUODENOSCOPY  10/08/2000   Stricture distal esoph, repeat dilation done 2012   ESOPHAGOGASTRODUODENOSCOPY  10/08/2000   Stricture distal esoph   I & D EXTREMITY Left 01/24/2017   Procedure: MINOR INCISION AND DRAINAGE LEFT THUMB;  Surgeon: Murrell Drivers, MD;  Location: [facility];  Service: Orthopedics;   Laterality: Left;    Allergies  Allergen Reactions   Hctz [Hydrochlorothiazide ] Other (See Comments)    Would avoid due to h/o gout.     Septra [Sulfamethoxazole-Trimethoprim]     diarrhea   Spironolactone  Other (See Comments)    Possible cause of aches.     Valsartan  Other (See Comments)    Possible cause of aches.       Physical Exam: General: The patient is alert and oriented x3 in no acute distress.  Dermatology: Skin is cool dry and supple bilateral.  Loss of toenail noted to the right fifth digit.  There is no longer any open wound.  Completely healed nailbed.  No drainage.  Clinically no sign of infection.  Vascular: Skin cool to touch.  Capillary refill WNL.  Clinically no concern for acute ischemic changes  Neurological: Grossly intact via light touch  Musculoskeletal Exam: No pedal deformities noted   Assessment/Plan of Care: 1.  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  The nailbed appears to be completely resolved and healed.  Explained that a new nail should grow in with time -Recommend shoes that do not irritate or constrict the toebox area -Return to clinic PRN       Thresa EMERSON Sar, DPM [facility]  Dr. Thresa EMERSON Sar, DPM    [address] [place], [place] [address]                Office [phone]  Fax [phone]

## 2024-05-06 DIAGNOSIS — H353211 Exudative age-related macular degeneration, right eye, with active choroidal neovascularization: Secondary | ICD-10-CM | POA: Diagnosis not present

## 2024-05-12 DIAGNOSIS — H40013 Open angle with borderline findings, low risk, bilateral: Secondary | ICD-10-CM | POA: Diagnosis not present

## 2024-05-12 DIAGNOSIS — Z961 Presence of intraocular lens: Secondary | ICD-10-CM | POA: Diagnosis not present

## 2024-05-12 DIAGNOSIS — H353132 Nonexudative age-related macular degeneration, bilateral, intermediate dry stage: Secondary | ICD-10-CM | POA: Diagnosis not present

## 2024-06-24 ENCOUNTER — Ambulatory Visit

## 2024-06-24 DIAGNOSIS — Z23 Encounter for immunization: Secondary | ICD-10-CM | POA: Diagnosis not present

## 2024-08-03 DIAGNOSIS — Z08 Encounter for follow-up examination after completed treatment for malignant neoplasm: Secondary | ICD-10-CM | POA: Diagnosis not present

## 2024-08-03 DIAGNOSIS — D1801 Hemangioma of skin and subcutaneous tissue: Secondary | ICD-10-CM | POA: Diagnosis not present

## 2024-08-03 DIAGNOSIS — L821 Other seborrheic keratosis: Secondary | ICD-10-CM | POA: Diagnosis not present

## 2024-08-03 DIAGNOSIS — L820 Inflamed seborrheic keratosis: Secondary | ICD-10-CM | POA: Diagnosis not present

## 2024-08-03 DIAGNOSIS — Z85828 Personal history of other malignant neoplasm of skin: Secondary | ICD-10-CM | POA: Diagnosis not present

## 2024-08-03 DIAGNOSIS — L570 Actinic keratosis: Secondary | ICD-10-CM | POA: Diagnosis not present

## 2024-08-03 DIAGNOSIS — L538 Other specified erythematous conditions: Secondary | ICD-10-CM | POA: Diagnosis not present

## 2024-08-03 DIAGNOSIS — L814 Other melanin hyperpigmentation: Secondary | ICD-10-CM | POA: Diagnosis not present

## 2024-08-05 DIAGNOSIS — H353132 Nonexudative age-related macular degeneration, bilateral, intermediate dry stage: Secondary | ICD-10-CM | POA: Diagnosis not present

## 2024-08-05 DIAGNOSIS — H401132 Primary open-angle glaucoma, bilateral, moderate stage: Secondary | ICD-10-CM | POA: Diagnosis not present

## 2024-08-05 DIAGNOSIS — H353221 Exudative age-related macular degeneration, left eye, with active choroidal neovascularization: Secondary | ICD-10-CM | POA: Diagnosis not present

## 2024-08-05 DIAGNOSIS — H3561 Retinal hemorrhage, right eye: Secondary | ICD-10-CM | POA: Diagnosis not present

## 2024-08-05 DIAGNOSIS — H353211 Exudative age-related macular degeneration, right eye, with active choroidal neovascularization: Secondary | ICD-10-CM | POA: Diagnosis not present

## 2024-08-05 DIAGNOSIS — H43813 Vitreous degeneration, bilateral: Secondary | ICD-10-CM | POA: Diagnosis not present

## 2024-08-05 DIAGNOSIS — H353223 Exudative age-related macular degeneration, left eye, with inactive scar: Secondary | ICD-10-CM | POA: Diagnosis not present

## 2024-08-17 DIAGNOSIS — H353132 Nonexudative age-related macular degeneration, bilateral, intermediate dry stage: Secondary | ICD-10-CM | POA: Diagnosis not present

## 2024-08-17 DIAGNOSIS — H353211 Exudative age-related macular degeneration, right eye, with active choroidal neovascularization: Secondary | ICD-10-CM | POA: Diagnosis not present

## 2024-08-17 DIAGNOSIS — H3561 Retinal hemorrhage, right eye: Secondary | ICD-10-CM | POA: Diagnosis not present

## 2024-08-17 DIAGNOSIS — H353223 Exudative age-related macular degeneration, left eye, with inactive scar: Secondary | ICD-10-CM | POA: Diagnosis not present

## 2024-08-17 DIAGNOSIS — H401132 Primary open-angle glaucoma, bilateral, moderate stage: Secondary | ICD-10-CM | POA: Diagnosis not present

## 2024-08-17 DIAGNOSIS — H353221 Exudative age-related macular degeneration, left eye, with active choroidal neovascularization: Secondary | ICD-10-CM | POA: Diagnosis not present

## 2024-08-17 DIAGNOSIS — H43813 Vitreous degeneration, bilateral: Secondary | ICD-10-CM | POA: Diagnosis not present

## 2024-08-17 LAB — OPHTHALMOLOGY REPORT-SCANNED

## 2024-08-18 ENCOUNTER — Ambulatory Visit (HOSPITAL_COMMUNITY)
Admission: RE | Admit: 2024-08-18 | Discharge: 2024-08-18 | Disposition: A | Source: Ambulatory Visit | Attending: Cardiology | Admitting: Cardiology

## 2024-08-18 DIAGNOSIS — I6523 Occlusion and stenosis of bilateral carotid arteries: Secondary | ICD-10-CM | POA: Insufficient documentation

## 2024-08-20 ENCOUNTER — Ambulatory Visit: Payer: Self-pay | Admitting: Cardiology

## 2024-08-20 DIAGNOSIS — I6523 Occlusion and stenosis of bilateral carotid arteries: Secondary | ICD-10-CM

## 2024-08-24 ENCOUNTER — Encounter: Payer: Self-pay | Admitting: Gastroenterology

## 2024-08-30 NOTE — Telephone Encounter (Signed)
 Pt returning your call. Please advise

## 2024-09-03 NOTE — Telephone Encounter (Signed)
 Wife Orrin) returned on RN's call.

## 2024-09-29 ENCOUNTER — Telehealth: Payer: Self-pay

## 2024-09-29 NOTE — Telephone Encounter (Signed)
 Copied from CRM [id number]. Topic: Clinical - Request for Lab/Test Order >> Sep 28, 2024  2:34 PM Macario HERO wrote: Reason for CRM: Patient is scheduled 3/30 for physical and is requesting fasting labs.

## 2024-09-29 NOTE — Telephone Encounter (Signed)
 Please schedule a lab visit.  Lab staff should notify me about the pending lab appointment closer to the scheduled date.  I can order labs at that point.  Thanks.

## 2024-10-04 ENCOUNTER — Telehealth: Payer: Self-pay

## 2024-10-04 MED ORDER — METOPROLOL TARTRATE 100 MG PO TABS: 100.0000 mg | ORAL_TABLET | Freq: Two times a day (BID) | ORAL | 0 refills | Status: DC

## 2024-10-04 NOTE — Telephone Encounter (Signed)
 Copied from CRM [id number]. Topic: Clinical - Medication Refill >> Oct 04, 2024 12:28 PM Rea C wrote: Medication: metoprolol  tartrate  Has the patient contacted their pharmacy? Patient needs new refill sent to [facility] due to insurance change to [facility].    (Agent: If no, request that the patient contact the pharmacy for the refill. If patient does not wish to contact the pharmacy document the reason why and proceed with request.) (Agent: If yes, when and what did the pharmacy advise?)  This is the patient's preferred pharmacy:  [facility] - [place], [place] - [address] [place] [place] [address] Phone: [phone] Fax: [phone]   Is this the correct pharmacy for this prescription? Yes If no, delete pharmacy and type the correct one.   Has the prescription been filled recently? Yes  Is the patient out of the medication? Yes  Has the patient been seen for an appointment in the last year OR does the patient have an upcoming appointment? Yes  Can we respond through MyChart? Yes  Agent: Please be advised that Rx refills may take up to 3 business days. We ask that you follow-up with your pharmacy.

## 2024-10-04 NOTE — Telephone Encounter (Signed)
 Erx sent to [facility].

## 2024-10-04 NOTE — Addendum Note (Signed)
 Addended by: TENNIE RAISIN B on: 10/04/2024 02:39 PM   Modules accepted: Orders

## 2024-10-08 ENCOUNTER — Other Ambulatory Visit: Payer: Self-pay | Admitting: Family Medicine

## 2024-10-08 MED ORDER — METOPROLOL TARTRATE 100 MG PO TABS: 100.0000 mg | ORAL_TABLET | Freq: Two times a day (BID) | ORAL | 1 refills | Status: AC

## 2024-10-08 MED ORDER — ALLOPURINOL 100 MG PO TABS: 100.0000 mg | ORAL_TABLET | Freq: Every day | ORAL | 1 refills | Status: AC

## 2024-10-08 MED ORDER — ATORVASTATIN CALCIUM 40 MG PO TABS: 40.0000 mg | ORAL_TABLET | Freq: Every day | ORAL | 1 refills | Status: AC

## 2024-10-08 MED ORDER — AMLODIPINE BESYLATE 10 MG PO TABS: 10.0000 mg | ORAL_TABLET | Freq: Every day | ORAL | 1 refills | Status: AC

## 2024-10-08 MED ORDER — DOXAZOSIN MESYLATE 2 MG PO TABS: 2.0000 mg | ORAL_TABLET | Freq: Every day | ORAL | 1 refills | Status: AC

## 2024-10-08 NOTE — Progress Notes (Signed)
 Rx sent to mail order per incoming request.

## 2024-10-19 LAB — OPHTHALMOLOGY REPORT-SCANNED

## 2024-12-09 ENCOUNTER — Other Ambulatory Visit

## 2024-12-13 ENCOUNTER — Encounter: Admitting: Family Medicine

## 2024-12-14 ENCOUNTER — Ambulatory Visit
